# Patient Record
Sex: Female | Born: 1945 | Race: White | Hispanic: No | Smoking: Never smoker
Health system: Southern US, Community
[De-identification: ages and names within clinical notes are randomized; demographics above are authoritative.]

## PROBLEM LIST (undated history)

## (undated) DIAGNOSIS — E079 Disorder of thyroid, unspecified: Secondary | ICD-10-CM

## (undated) DIAGNOSIS — K219 Gastro-esophageal reflux disease without esophagitis: Secondary | ICD-10-CM

## (undated) DIAGNOSIS — E785 Hyperlipidemia, unspecified: Secondary | ICD-10-CM

## (undated) DIAGNOSIS — F32A Depression, unspecified: Secondary | ICD-10-CM

## (undated) DIAGNOSIS — I10 Essential (primary) hypertension: Secondary | ICD-10-CM

## (undated) DIAGNOSIS — F419 Anxiety disorder, unspecified: Secondary | ICD-10-CM

## (undated) DIAGNOSIS — E119 Type 2 diabetes mellitus without complications: Secondary | ICD-10-CM

## (undated) DIAGNOSIS — F329 Major depressive disorder, single episode, unspecified: Secondary | ICD-10-CM

## (undated) HISTORY — DX: Major depressive disorder, single episode, unspecified: F32.9

## (undated) HISTORY — DX: Disorder of thyroid, unspecified: E07.9

## (undated) HISTORY — DX: Type 2 diabetes mellitus without complications: E11.9

## (undated) HISTORY — PX: EYE SURGERY: SHX253

## (undated) HISTORY — PX: BREAST SURGERY: SHX581

## (undated) HISTORY — PX: ABDOMINAL HYSTERECTOMY: SHX81

## (undated) HISTORY — DX: Depression, unspecified: F32.A

## (undated) HISTORY — DX: Anxiety disorder, unspecified: F41.9

## (undated) HISTORY — PX: BREAST EXCISIONAL BIOPSY: SUR124

## (undated) HISTORY — PX: BREAST BIOPSY: SHX20

---

## 2012-06-14 ENCOUNTER — Emergency Department: Payer: Self-pay | Admitting: Internal Medicine

## 2012-06-14 LAB — URINALYSIS, COMPLETE
Bacteria: NONE SEEN
Ketone: NEGATIVE
Nitrite: NEGATIVE
Protein: NEGATIVE
RBC,UR: 1 /HPF (ref 0–5)
Specific Gravity: 1.009 (ref 1.003–1.030)
Squamous Epithelial: 1
WBC UR: 5 /HPF (ref 0–5)

## 2012-06-14 LAB — COMPREHENSIVE METABOLIC PANEL
Albumin: 3.2 g/dL — ABNORMAL LOW (ref 3.4–5.0)
BUN: 5 mg/dL — ABNORMAL LOW (ref 7–18)
Bilirubin,Total: 0.3 mg/dL (ref 0.2–1.0)
Calcium, Total: 8.6 mg/dL (ref 8.5–10.1)
Chloride: 108 mmol/L — ABNORMAL HIGH (ref 98–107)
Creatinine: 0.64 mg/dL (ref 0.60–1.30)
Glucose: 101 mg/dL — ABNORMAL HIGH (ref 65–99)
Potassium: 3.4 mmol/L — ABNORMAL LOW (ref 3.5–5.1)
SGOT(AST): 21 U/L (ref 15–37)
SGPT (ALT): 23 U/L (ref 12–78)
Sodium: 142 mmol/L (ref 136–145)
Total Protein: 6.3 g/dL — ABNORMAL LOW (ref 6.4–8.2)

## 2012-06-14 LAB — CBC
HCT: 30.2 % — ABNORMAL LOW (ref 35.0–47.0)
MCH: 28 pg (ref 26.0–34.0)
MCV: 83 fL (ref 80–100)
Platelet: 245 10*3/uL (ref 150–440)
RBC: 3.64 10*6/uL — ABNORMAL LOW (ref 3.80–5.20)

## 2012-12-20 ENCOUNTER — Inpatient Hospital Stay: Payer: Self-pay | Admitting: Internal Medicine

## 2012-12-20 LAB — CK TOTAL AND CKMB (NOT AT ARMC)
CK, Total: 156 U/L (ref 21–215)
CK-MB: 1.4 ng/mL (ref 0.5–3.6)

## 2012-12-20 LAB — COMPREHENSIVE METABOLIC PANEL
Albumin: 3.9 g/dL (ref 3.4–5.0)
Alkaline Phosphatase: 77 U/L (ref 50–136)
Anion Gap: 9 (ref 7–16)
BUN: 15 mg/dL (ref 7–18)
Bilirubin,Total: 0.4 mg/dL (ref 0.2–1.0)
Calcium, Total: 9.2 mg/dL (ref 8.5–10.1)
Co2: 23 mmol/L (ref 21–32)
Creatinine: 1.11 mg/dL (ref 0.60–1.30)
EGFR (Non-African Amer.): 51 — ABNORMAL LOW
Potassium: 3.7 mmol/L (ref 3.5–5.1)
SGPT (ALT): 26 U/L (ref 12–78)

## 2012-12-20 LAB — CBC WITH DIFFERENTIAL/PLATELET
Basophil #: 0.1 10*3/uL (ref 0.0–0.1)
Basophil %: 0.5 %
Eosinophil %: 1.4 %
HCT: 39.5 % (ref 35.0–47.0)
HGB: 13.1 g/dL (ref 12.0–16.0)
Lymphocyte #: 8.2 10*3/uL — ABNORMAL HIGH (ref 1.0–3.6)
Lymphocyte %: 47.1 %
MCH: 28.8 pg (ref 26.0–34.0)
MCHC: 33 g/dL (ref 32.0–36.0)
MCV: 87 fL (ref 80–100)
Platelet: 279 10*3/uL (ref 150–440)
WBC: 17.5 10*3/uL — ABNORMAL HIGH (ref 3.6–11.0)

## 2012-12-20 LAB — URINALYSIS, COMPLETE
Bacteria: NONE SEEN
Bilirubin,UR: NEGATIVE
Blood: NEGATIVE
Glucose,UR: NEGATIVE mg/dL (ref 0–75)
Ketone: NEGATIVE
Nitrite: NEGATIVE
Ph: 6 (ref 4.5–8.0)
Protein: NEGATIVE
RBC,UR: <1 /HPF (ref 0–5)
Specific Gravity: 1.015 (ref 1.003–1.030)

## 2012-12-21 LAB — CBC WITH DIFFERENTIAL/PLATELET
Basophil #: 0 10*3/uL (ref 0.0–0.1)
HCT: 39.4 % (ref 35.0–47.0)
MCH: 28.5 pg (ref 26.0–34.0)
MCHC: 32.8 g/dL (ref 32.0–36.0)
Monocyte #: 0.1 x10 3/mm — ABNORMAL LOW (ref 0.2–0.9)
Neutrophil #: 13.9 10*3/uL — ABNORMAL HIGH (ref 1.4–6.5)
RBC: 4.53 10*6/uL (ref 3.80–5.20)
RDW: 13.6 % (ref 11.5–14.5)

## 2012-12-22 LAB — WBC: WBC: 25.4 10*3/uL — ABNORMAL HIGH (ref 3.6–11.0)

## 2012-12-23 LAB — CBC WITH DIFFERENTIAL/PLATELET
Basophil #: 0.1 10*3/uL (ref 0.0–0.1)
Basophil %: 0.3 %
Eosinophil #: 0 10*3/uL (ref 0.0–0.7)
Eosinophil %: 0.1 %
HGB: 13.1 g/dL (ref 12.0–16.0)
Lymphocyte #: 2.6 10*3/uL (ref 1.0–3.6)
MCHC: 34.1 g/dL (ref 32.0–36.0)
Monocyte #: 0.6 x10 3/mm (ref 0.2–0.9)
Monocyte %: 2.5 %
Neutrophil #: 19.9 10*3/uL — ABNORMAL HIGH (ref 1.4–6.5)
RDW: 13.4 % (ref 11.5–14.5)
WBC: 23.2 10*3/uL — ABNORMAL HIGH (ref 3.6–11.0)

## 2012-12-23 LAB — CREATININE, SERUM: EGFR (Non-African Amer.): >60

## 2012-12-25 LAB — CULTURE, BLOOD (SINGLE)

## 2013-01-11 ENCOUNTER — Ambulatory Visit: Payer: Self-pay | Admitting: Internal Medicine

## 2013-01-11 LAB — BASIC METABOLIC PANEL
Anion Gap: 8 (ref 7–16)
BUN: 6 mg/dL — ABNORMAL LOW (ref 7–18)
Calcium, Total: 9 mg/dL (ref 8.5–10.1)
Co2: 25 mmol/L (ref 21–32)
Creatinine: 0.75 mg/dL (ref 0.60–1.30)
EGFR (African American): >60
Sodium: 139 mmol/L (ref 136–145)

## 2013-04-03 ENCOUNTER — Other Ambulatory Visit: Payer: Self-pay | Admitting: Physician Assistant

## 2013-04-03 LAB — COMPREHENSIVE METABOLIC PANEL
ALBUMIN: 4.1 g/dL (ref 3.4–5.0)
ALK PHOS: 78 U/L
ALT: 22 U/L (ref 12–78)
Anion Gap: 5 — ABNORMAL LOW (ref 7–16)
BUN: 7 mg/dL (ref 7–18)
Bilirubin,Total: 0.3 mg/dL (ref 0.2–1.0)
CHLORIDE: 105 mmol/L (ref 98–107)
CO2: 28 mmol/L (ref 21–32)
Calcium, Total: 9.2 mg/dL (ref 8.5–10.1)
Creatinine: 0.97 mg/dL (ref 0.60–1.30)
EGFR (Non-African Amer.): >60
Glucose: 115 mg/dL — ABNORMAL HIGH (ref 65–99)
Osmolality: 275 (ref 275–301)
Potassium: 4.3 mmol/L (ref 3.5–5.1)
SGOT(AST): 16 U/L (ref 15–37)
Sodium: 138 mmol/L (ref 136–145)
Total Protein: 7.1 g/dL (ref 6.4–8.2)

## 2013-04-03 LAB — CBC WITH DIFFERENTIAL/PLATELET
Basophil #: 0 10*3/uL (ref 0.0–0.1)
Basophil %: 0.2 %
Eosinophil #: 0.1 10*3/uL (ref 0.0–0.7)
Eosinophil %: 1.1 %
HCT: 40.8 % (ref 35.0–47.0)
HGB: 13.1 g/dL (ref 12.0–16.0)
Lymphocyte #: 3.8 10*3/uL — ABNORMAL HIGH (ref 1.0–3.6)
Lymphocyte %: 37.3 %
MCH: 28.3 pg (ref 26.0–34.0)
MCHC: 32 g/dL (ref 32.0–36.0)
MCV: 88 fL (ref 80–100)
MONO ABS: 0.5 x10 3/mm (ref 0.2–0.9)
MONOS PCT: 4.5 %
Neutrophil #: 5.9 10*3/uL (ref 1.4–6.5)
Neutrophil %: 56.9 %
PLATELETS: 248 10*3/uL (ref 150–440)
RBC: 4.62 10*6/uL (ref 3.80–5.20)
RDW: 13.6 % (ref 11.5–14.5)
WBC: 10.3 10*3/uL (ref 3.6–11.0)

## 2013-04-03 LAB — TSH: THYROID STIMULATING HORM: 1.13 u[IU]/mL

## 2013-07-17 ENCOUNTER — Other Ambulatory Visit: Payer: Self-pay | Admitting: Physician Assistant

## 2013-07-17 LAB — CBC WITH DIFFERENTIAL/PLATELET
Basophil #: 0.1 10*3/uL (ref 0.0–0.1)
Basophil %: 0.8 %
EOS PCT: 1.2 %
Eosinophil #: 0.1 10*3/uL (ref 0.0–0.7)
HCT: 39.7 % (ref 35.0–47.0)
HGB: 12.7 g/dL (ref 12.0–16.0)
Lymphocyte #: 3.9 10*3/uL — ABNORMAL HIGH (ref 1.0–3.6)
Lymphocyte %: 36.4 %
MCH: 29 pg (ref 26.0–34.0)
MCHC: 32.1 g/dL (ref 32.0–36.0)
MCV: 90 fL (ref 80–100)
MONOS PCT: 3.4 %
Monocyte #: 0.4 x10 3/mm (ref 0.2–0.9)
NEUTROS PCT: 58.2 %
Neutrophil #: 6.3 10*3/uL (ref 1.4–6.5)
Platelet: 252 10*3/uL (ref 150–440)
RBC: 4.4 10*6/uL (ref 3.80–5.20)
RDW: 14.3 % (ref 11.5–14.5)
WBC: 10.8 10*3/uL (ref 3.6–11.0)

## 2013-07-17 LAB — COMPREHENSIVE METABOLIC PANEL
ALBUMIN: 3.9 g/dL (ref 3.4–5.0)
Alkaline Phosphatase: 73 U/L
Anion Gap: 4 — ABNORMAL LOW (ref 7–16)
BUN: 7 mg/dL (ref 7–18)
Bilirubin,Total: 0.5 mg/dL (ref 0.2–1.0)
CREATININE: 0.91 mg/dL (ref 0.60–1.30)
Calcium, Total: 8.9 mg/dL (ref 8.5–10.1)
Chloride: 104 mmol/L (ref 98–107)
Co2: 27 mmol/L (ref 21–32)
EGFR (African American): >60
GLUCOSE: 144 mg/dL — AB (ref 65–99)
Osmolality: 271 (ref 275–301)
Potassium: 4.1 mmol/L (ref 3.5–5.1)
SGOT(AST): 28 U/L (ref 15–37)
SGPT (ALT): 30 U/L (ref 12–78)
SODIUM: 135 mmol/L — AB (ref 136–145)
TOTAL PROTEIN: 6.9 g/dL (ref 6.4–8.2)

## 2013-07-17 LAB — LIPID PANEL
CHOLESTEROL: 133 mg/dL (ref 0–200)
HDL: 36 mg/dL — AB (ref 40–60)
Ldl Cholesterol, Calc: 69 mg/dL (ref 0–100)
Triglycerides: 142 mg/dL (ref 0–200)
VLDL Cholesterol, Calc: 28 mg/dL (ref 5–40)

## 2013-07-17 LAB — TSH: Thyroid Stimulating Horm: 1.21 u[IU]/mL

## 2013-07-17 LAB — HEMOGLOBIN A1C: Hemoglobin A1C: 7.4 % — ABNORMAL HIGH (ref 4.2–6.3)

## 2013-08-08 ENCOUNTER — Ambulatory Visit: Payer: Self-pay | Admitting: Gastroenterology

## 2013-08-21 ENCOUNTER — Ambulatory Visit: Payer: Self-pay | Admitting: Physician Assistant

## 2013-08-27 ENCOUNTER — Ambulatory Visit: Payer: Self-pay | Admitting: Physician Assistant

## 2013-12-31 ENCOUNTER — Ambulatory Visit: Payer: Self-pay | Admitting: Physician Assistant

## 2013-12-31 LAB — T4, FREE: FREE THYROXINE: 0.9 ng/dL (ref 0.76–1.46)

## 2013-12-31 LAB — TSH: Thyroid Stimulating Horm: 2.05 u[IU]/mL

## 2014-03-01 DIAGNOSIS — G4733 Obstructive sleep apnea (adult) (pediatric): Secondary | ICD-10-CM | POA: Diagnosis not present

## 2014-03-05 DIAGNOSIS — G4733 Obstructive sleep apnea (adult) (pediatric): Secondary | ICD-10-CM | POA: Diagnosis not present

## 2014-03-28 DIAGNOSIS — E042 Nontoxic multinodular goiter: Secondary | ICD-10-CM | POA: Diagnosis not present

## 2014-03-28 DIAGNOSIS — G4700 Insomnia, unspecified: Secondary | ICD-10-CM | POA: Diagnosis not present

## 2014-03-28 DIAGNOSIS — E1165 Type 2 diabetes mellitus with hyperglycemia: Secondary | ICD-10-CM | POA: Diagnosis not present

## 2014-04-01 DIAGNOSIS — G4733 Obstructive sleep apnea (adult) (pediatric): Secondary | ICD-10-CM | POA: Diagnosis not present

## 2014-04-02 DIAGNOSIS — G4733 Obstructive sleep apnea (adult) (pediatric): Secondary | ICD-10-CM | POA: Diagnosis not present

## 2014-04-17 ENCOUNTER — Ambulatory Visit: Payer: Self-pay | Admitting: Physician Assistant

## 2014-04-17 DIAGNOSIS — E039 Hypothyroidism, unspecified: Secondary | ICD-10-CM | POA: Diagnosis not present

## 2014-04-17 DIAGNOSIS — E049 Nontoxic goiter, unspecified: Secondary | ICD-10-CM | POA: Diagnosis not present

## 2014-04-17 DIAGNOSIS — G4733 Obstructive sleep apnea (adult) (pediatric): Secondary | ICD-10-CM | POA: Diagnosis not present

## 2014-04-30 DIAGNOSIS — G4733 Obstructive sleep apnea (adult) (pediatric): Secondary | ICD-10-CM | POA: Diagnosis not present

## 2014-05-26 DIAGNOSIS — J209 Acute bronchitis, unspecified: Secondary | ICD-10-CM | POA: Diagnosis not present

## 2014-05-26 DIAGNOSIS — R0602 Shortness of breath: Secondary | ICD-10-CM | POA: Diagnosis not present

## 2014-05-26 DIAGNOSIS — J4520 Mild intermittent asthma, uncomplicated: Secondary | ICD-10-CM | POA: Diagnosis not present

## 2014-05-30 DIAGNOSIS — G4700 Insomnia, unspecified: Secondary | ICD-10-CM | POA: Diagnosis not present

## 2014-05-30 DIAGNOSIS — E042 Nontoxic multinodular goiter: Secondary | ICD-10-CM | POA: Diagnosis not present

## 2014-05-30 DIAGNOSIS — E1165 Type 2 diabetes mellitus with hyperglycemia: Secondary | ICD-10-CM | POA: Diagnosis not present

## 2014-05-30 DIAGNOSIS — I10 Essential (primary) hypertension: Secondary | ICD-10-CM | POA: Diagnosis not present

## 2014-05-30 NOTE — H&P (Signed)
PATIENT NAME:  [name], ANGELO MR#:  [id number] DATE OF BIRTH:  Nov 19, 1945  DATE OF ADMISSION:  12/20/2012  ADMITTING PHYSICIAN: Gladstone Lighter, M.D.   PRIMARY CARE PHYSICIAN: Nonlocal in [facility].   CHIEF COMPLAINT: Cough and breathing difficulty.   HISTORY OF PRESENT ILLNESS: Ms. Sonier is a 69 year old Caucasian female with past medical history significant for hypertension, depression and anxiety, hyperlipidemia, insomnia and history of mold exposure while living in [place] for two years developing hypersensitivity pneumonitis, and reactive airway disease at the time. Recently moved with her daughter here in [place] for the last six months.  Since moving here she has not had any respiratory issues. Her symptoms this time started with cold-like symptoms with congestion, chills, low-grade fevers for the past four days; however, her breathing got worse yesterday and she was in visible respiratory distress when seen by daughter this morning and EMS was called and she was brought to the hospital. The patient's sats here are at 90% on room air and even with minimal exertion her sats were dropping into 80s. She has significant wheezing on exam and is being admitted for possible bronchitis and reactive airway disease at this time.   PAST MEDICAL HISTORY: 1. Hypertension.  2. Depression and anxiety.  3. Insomnia. 4. Chronic right knee pain.  5. Gastroesophageal reflux disease.    PAST SURGICAL HISTORY: Hysterectomy.   ALLERGIES: No known drug allergies.   CURRENT HOME MEDICATIONS:  1. Acetaminophen/hydrocodone 325 mg/10 mg 1 tablet p.o. b.i.d. p.r.n.  2. Aspirin 81 mg p.o. daily.  3. Benzonatate 100 mg capsules q.8 hours p.r.n. for cough.  4. Celexa 40 mg p.o. daily.  5. Cyclobenzaprine 10 mg p.o. b.i.d. p.r.n.  6. Diltiazem  240 mg p.o. daily.  7. Diphenhydramine 25 mg 2 tablets at bedtime.  8. Lisinopril 20 mg p.o. daily.  9. Lorazepam 1 mg p.o. 3 times a day as  needed for anxiety.  10. Protonix 40 mg p.o. b.i.d.  11. Promethazine 25 mg p.o. b.i.d. p.r.n. for nausea.  12. Simvastatin 40 mg p.o. at bedtime.  13. Sucralfate 1 gram 4 times a day for GERD. 14. Zolpidem 10 mg p.o.  at bedtime for sleep.   SOCIAL HISTORY: Currently living at home with her daughter and grandkids. No history of any smoking, smoke exposure  other alcohol use. Stable at baseline and walks independently.   FAMILY HISTORY: History of coronary artery disease, cancer and diabetes runs in the family.    REVIEW OF SYSTEMS:  CONSTITUTIONAL: No fever, positive for fatigue and weakness.  EYES: Uses reading glasses. No blurred vision, double vision, inflammation or glaucoma. No cataracts.  ENT: No tinnitus, ear pain, hearing loss, epistaxis or discharge. RESPIRATORY: Positive for cough, wheeze. No hemoptysis or chronic obstructive pulmonary disease.  CARDIOVASCULAR: No chest pain, orthopnea, edema, arrhythmia, palpitations or syncope.  GASTROINTESTINAL: No nausea, vomiting, diarrhea, abdominal pain, hematemesis or melena.  GENITOURINARY: No dysuria, hematuria, renal calculus, frequency or incontinence.  ENDOCRINE: No polyuria, nocturia, thyroid problems, heat or cold intolerance.  HEMATOLOGY: No anemia, easy bruising or bleeding.  SKIN: No acne, rash or lesions.  MUSCULOSKELETAL: Positive for right knee pain and chronic lower extremity pain. No arthritis or gout.  NEUROLOGIC: No numbness, weakness, CVA, TIA or seizures.  PSYCHOLOGICAL: No anxiety, insomnia, depression.   PHYSICAL EXAMINATION: VITAL SIGNS: Temperature 97.9,  degrees Fahrenheit, pulse 134, respirations 18, blood pressure 140/90, pulse ox  90% on room air and 85% percent on minimal exertion  on room air.  GENERAL: Well-built, well-nourished female sitting in bed in mild respiratory distress secondary to wheeze. HEENT: Normocephalic, atraumatic. Pupils equal, round, reacting to light. Anicteric sclerae. Extraocular  movements intact. Oropharynx clear without erythema, mass or exudates.  NECK: Supple. No thyromegaly, JVD or carotid bruits. No lymphadenopathy.  LUNGS: She is moving air bilaterally, has coarse inspiratory sounds and mild expiratory wheezes diffusely. No crackles. No rales heard, minimal use of accessory muscles on exertion.  CARDIOVASCULAR: S1, S2 regular rate and rhythm. No murmurs, rubs or gallops.  ABDOMEN: Soft, nontender, nondistended. No hepatosplenomegaly. Normal bowel sounds.  EXTREMITIES: No pedal edema. No clubbing or cyanosis, 2+ dorsalis pedis pulses palpable bilaterally.  SKIN: No acne, rash or lesions.  LYMPHATICS: No cervical or inguinal lymphadenopathy.  NEUROLOGIC: Cranial nerves II through XII remain intact. Motor strength 5/5 all 4 extremities. Sensation is intact and deep tendon reflexes are 2+ symmetric bilaterally.  PSYCHOLOGICAL: The patient is awake, alert, oriented x 3.   LAB DATA: WBC 17.3, hemoglobin 13.1, hematocrit 39.2, platelet count 279.   Sodium 37, potassium 3.7, chloride 105, bicarbonate 23. BUN 50, creatinine  1.1, glucose 138,  calcium of 9.2.   ALT 26. AST 25 , total bili 0.4 and albumin of 3.9. Troponin less than 0.02. CK 156, CPK-MB 1.4.   Chest x-ray showing hiatal hernia, clear lung fields. There was no acute cardiopulmonary disease.   EKG showing sinus tachycardia, heart rate of 132, no acute ST-T wave abnormalities.   ASSESSMENT AND PLAN: This is a 69 year old female with history of hypertension, depression and anxiety, insomnia and history of hypersensitivity pneumonitis secondary to mold exposure 2 years ago. Relocated here recently, comes secondary to  acute respiratory distress and hypoxia.  1. Acute respiratory distress with hypoxia on minimal exertion, likely secondary to acute bronchitis with reactive airway disease. Chest x-ray appears clear. CT chest is pending. The patient does not have any history of smoking. We will admit. Start IV  steroids,  DuoNebs and also Levaquin.  Follow  blood cultures. Use oxygen as needed.  2. Hypertension. Continue home medications.  3. Depression and anxiety and insomnia. Continue her home medications.  4. Hiatal hernia and gastroesophageal reflux disease. The patient on sucralfate and Protonix b.i.d.   5. Deep vein thrombosis prophylaxis with Lovenox.  6. CODE STATUS: FULL CODE.   TIME SPENT ON ADMISSION: 50 minutes.    ____________________________ Gladstone Lighter, MD rk:sg D: 12/20/2012 15:45:32 ET T: 12/20/2012 16:42:11 ET JOB#: [id number]  cc: Gladstone Lighter, MD, <Dictator> Gladstone Lighter MD ELECTRONICALLY SIGNED 12/21/2012 17:07

## 2014-05-30 NOTE — Discharge Summary (Signed)
PATIENT NAME:  [name], [name] MR#:  [id number] DATE OF BIRTH:  12-08-1945  DATE OF ADMISSION:  12/20/2012 DATE OF DISCHARGE:  12/23/2012  DISCHARGE DIAGNOSES: 1.  Acute respiratory failure.  2.  Chronic obstructive pulmonary disease exacerbation.  3.  Hypertension.  4.  Depression.  5.  Pneumonia.   CONDITION ON DISCHARGE: Stable.   CODE STATUS: FULL CODE.   DISCHARGE MEDICATIONS: 1.  Simvastatin 40 mg oral tablet once a day.  2.  Zolpidem 10 mg oral tablet once a day.  3.  Citalopram 40 mg once a day.  4.  Benzonatate 100 mg every 8 hours as needed for cough.  5.  Lorazepam 1 mg 3 times a day as needed for anxiety.  6.  Aspirin 81 mg once a day.  7.  Pantoprazole 40 mg delayed-release 2 times a day.  8.  Sucralfate 1 gram oral tablet 4 times a day.  9.  Cyclobenzaprine 10 mg oral tablet 2 times a day as needed for leg pain.  10.  Acetaminophen and hydrocodone 10 mg 2 times a day.  11.  Diltiazem 240 mg 24-hour extended release once a day.  12.  Prednisone 10 mg capsule, start at 60 mg and taper by 10 mg daily until complete.  13.  Lisinopril 20 mg once a day.  14.  Metoprolol 25 mg 2 times a day.  15.  Augmentin 875 plus 125 mg every 12 hours for 5 days.  16.  Spiriva 18 mcg inhalation once a day.  17.  Advair 1 puff 2 times a day.  18.  DuoNeb inhalation 3 mL 4 times a day as needed.  DISCHARGE INSTRUCTIONS: Advised to follow in pulmonary clinic in 2 weeks to schedule bronchoscopy with Dr. Humphrey Rolls.  HISTORY OF PRESENT ILLNESS: The patient is a 69 year old Caucasian female with past medical history significant for hypertension, depression, anxiety, hyperlipidemia, insomnia, and mold exposure while living in [place] for 2 years. Had hypersensitivity pneumonitis and reactive airway disease. Came to hospital because she had repeatedly respiratory issues. Started with cold like symptoms and then congestion. Was in respiratory distress in the Emergency Room. On room air she was  saturating 90%, but with minimal exertion she was dropping in 80s and significant wheezing on exam so admitted for possible bronchitis with reactive airway disease.   HOSPITAL COURSE AND STAY: Acute respiratory failure as this was present on the admission evident by dropping oxygen saturation and needing supplemental oxygen to maintain it with minimal exertion and having use of accessory muscles of respiration with excessive wheezing. She was started on IV steroids and antibiotics and nebulizer treatment. Pulmonary consult was called in with Dr. Devona Konig and he suggested to follow in office after 2 weeks for possible bronchoscopy. There were also some findings questionable for left upper lobe pneumonia and she was continued on antibiotic, felt significantly better after 3 to 4 days of treatment, and we discharged home with Augmentin orally.   OTHER MEDICAL ISSUES: 1.  Hypertension. We continued home medication and added metoprolol for better control.  2.  Depression and anxiety. We continued home medication, and the patient remained stable.   CONSULTATIONS IN THE HOSPITAL: Dr. Devona Konig for pulmonology.   IMPORTANT LABORATORY AND DIAGNOSTICS: WBC 17.5 on admission AND hemoglobin 13.1. Creatinine 1.11, chloride 105, potassium 3.7. Troponin less than 0.02.   Chest x-ray, PA and lateral, on admission showed stable Large hiatal hernia. No acute cardiopulmonary disease. CT chest with contrast showed linear calcific lesions  in left main stem bronchus extending into the left upper lobe bronchus most compatible with broncholith or less likely calcified lymph node into bronchus, resulted in secondary collapse of anterior left upper lobe.   White cell count was 16,000 on admission, which went up to 23,000 on further follow-up, possibly due to steroids.   TOTAL TIME SPENT ON THIS DISCHARGE: 40 minutes. ____________________________ Ceasar Lund Anselm Jungling, MD vgv:sb D: 12/24/2012 16:23:22  ET T: 12/24/2012 17:23:21 ET JOB#: [id number]  cc: Ceasar Lund. Anselm Jungling, MD, <Dictator> Allyne Gee, MD Vaughan Basta MD ELECTRONICALLY SIGNED 01/07/2013 10:10

## 2014-05-31 DIAGNOSIS — G4733 Obstructive sleep apnea (adult) (pediatric): Secondary | ICD-10-CM | POA: Diagnosis not present

## 2014-06-01 DIAGNOSIS — Z8639 Personal history of other endocrine, nutritional and metabolic disease: Secondary | ICD-10-CM | POA: Insufficient documentation

## 2014-06-01 DIAGNOSIS — Z8709 Personal history of other diseases of the respiratory system: Secondary | ICD-10-CM | POA: Insufficient documentation

## 2014-06-01 DIAGNOSIS — Z8719 Personal history of other diseases of the digestive system: Secondary | ICD-10-CM | POA: Insufficient documentation

## 2014-06-01 DIAGNOSIS — Z8679 Personal history of other diseases of the circulatory system: Secondary | ICD-10-CM | POA: Insufficient documentation

## 2014-06-09 DIAGNOSIS — H2513 Age-related nuclear cataract, bilateral: Secondary | ICD-10-CM | POA: Diagnosis not present

## 2014-07-17 ENCOUNTER — Ambulatory Visit: Payer: Self-pay | Admitting: Psychiatry

## 2014-07-21 ENCOUNTER — Telehealth: Payer: Self-pay | Admitting: Psychiatry

## 2014-07-22 ENCOUNTER — Encounter: Payer: Self-pay | Admitting: Psychiatry

## 2014-07-22 ENCOUNTER — Ambulatory Visit (INDEPENDENT_AMBULATORY_CARE_PROVIDER_SITE_OTHER): Payer: 59 | Admitting: Psychiatry

## 2014-07-22 VITALS — BP 126/78 | HR 68 | Temp 97.3°F | Ht 60.0 in

## 2014-07-22 DIAGNOSIS — F329 Major depressive disorder, single episode, unspecified: Secondary | ICD-10-CM

## 2014-07-22 DIAGNOSIS — F32A Depression, unspecified: Secondary | ICD-10-CM

## 2014-07-22 DIAGNOSIS — F410 Panic disorder [episodic paroxysmal anxiety] without agoraphobia: Secondary | ICD-10-CM | POA: Diagnosis not present

## 2014-07-22 DIAGNOSIS — F411 Generalized anxiety disorder: Secondary | ICD-10-CM | POA: Insufficient documentation

## 2014-07-22 DIAGNOSIS — F331 Major depressive disorder, recurrent, moderate: Secondary | ICD-10-CM | POA: Insufficient documentation

## 2014-07-22 DIAGNOSIS — G4700 Insomnia, unspecified: Secondary | ICD-10-CM | POA: Diagnosis not present

## 2014-07-22 DIAGNOSIS — G4709 Other insomnia: Secondary | ICD-10-CM | POA: Insufficient documentation

## 2014-07-22 MED ORDER — CITALOPRAM HYDROBROMIDE 40 MG PO TABS: 40.0000 mg | ORAL_TABLET | Freq: Every day | ORAL | Status: DC

## 2014-07-22 MED ORDER — LORAZEPAM 1 MG PO TABS: 1.0000 mg | ORAL_TABLET | Freq: Three times a day (TID) | ORAL | Status: DC | PRN

## 2014-07-22 MED ORDER — QUETIAPINE FUMARATE 100 MG PO TABS: 100.0000 mg | ORAL_TABLET | Freq: Every day | ORAL | Status: DC

## 2014-07-22 NOTE — Progress Notes (Signed)
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 She relates that her primary care physician continues to prescribe her Jerrye Noble for insomnia. She is no longer taking the Ambien that was previously prescribed from this office. I did bring in the patient's daughter to clarify the patient's medications. The patient continues to take Celexa, lorazepam and Seroquel.  Patient indicates her mood is been good. She states she has her grandchildren full-time for the summer. She states that their grandchildren are ages 58 and 60. She states that her appetite has been good however she states she is trying diet plans. She states most recent diet plan she did not like because the food did not taste very good and then she would end up eating other things. She states she is sleeping well. She denies any side effects from her medications.  Chief Complaint:  Visit Diagnosis:  No diagnosis found.  Past Medical History:  Past Medical History  Diagnosis Date  . Anxiety   . Depression   . Thyroid disease   . Diabetes mellitus, type II     Past Surgical History  Procedure Laterality Date  . Breast biopsy    . Abdominal hysterectomy     Family History:  Family History  Problem Relation Age of Onset  . Anxiety disorder Mother   . Colon cancer Mother   . Heart disease Father   . Obesity Sister   . Anxiety disorder Sister   . Depression Sister   . Osteoporosis Sister    Social History:  History   Social History  . Marital Status: Widowed    Spouse Name: N/A  . Number of Children: N/A  . Years of Education: N/A   Social History Main Topics  . Smoking status: Never Smoker   . Smokeless tobacco: Never Used  . Alcohol Use: No  . Drug Use: No  . Sexual Activity: No   Other Topics Concern  . None   Social History Narrative   Additional History:    Assessment:   Musculoskeletal: Strength & Muscle Tone: within normal limits Gait & Station: normal Patient leans: N/A  Psychiatric Specialty Exam: HPI  Review of Systems  Psychiatric/Behavioral: Negative for depression, suicidal ideas, hallucinations, memory loss and substance abuse. The patient has insomnia (Stable with treatment with Belsomra). The patient is not nervous/anxious.     Blood pressure 126/78, pulse 68, temperature 97.3 F (36.3 C), temperature source Tympanic, height 5' (1.524 m), SpO2 91 %.There is no weight on file to calculate BMI.  General Appearance: Neat and Well Groomed  Eye Contact:  Good  Speech:  Clear and Coherent and Normal Rate  Volume:  Normal  Mood:  Good  Affect:  Congruent  Thought Process:  Linear and Logical  Orientation:  Full (Time, Place, and Person)  Thought Content:  Negative  Suicidal Thoughts:  No  Homicidal Thoughts:  No  Memory:  Immediate;   Good Recent;   Good Remote;   Good  Judgement:  Good  Insight:  Good  Psychomotor Activity:  Negative  Concentration:  Good  Recall:  Good  Fund of Knowledge: Good  Language: Good  Akathisia:  Negative  Handed:  Right unknown  AIMS (if indicated):  Not done  Assets:  Communication Skills Desire for Improvement Social Support  ADL's:  Intact  Cognition: WNL  Sleep:  Good  Is the patient at risk to self?  No. Has the patient been a risk to self in the past 6 months?  No. Has the patient been a risk to self within the distant past?  No. Is the patient a risk to others?  No. Has the patient been a risk to others in the past 6 months?  No. Has the patient been a risk to others within the distant past?  No.  Current Medications: Current Outpatient Prescriptions  Medication Sig Dispense Refill  . aspirin 81 MG tablet Take 1 tablet by mouth daily.    . BELSOMRA 20 MG TABS TAKE 1 TABLET BY MOUTH EVERY NIGHT AT BEDTIME AS NEEDED FOR INSOMNIA.  3  . citalopram (CELEXA) 40 MG tablet  Take 1 tablet (40 mg total) by mouth daily. 30 tablet 4  . diltiazem (CARDIZEM CD) 240 MG 24 hr capsule Take 240 mg by mouth daily.  3  . levothyroxine (SYNTHROID, LEVOTHROID) 50 MCG tablet Take 50 mcg by mouth every morning.  5  . lisinopril (PRINIVIL,ZESTRIL) 20 MG tablet Take 20 mg by mouth daily.  5  . LORazepam (ATIVAN) 1 MG tablet Take 1 tablet (1 mg total) by mouth 3 (three) times daily as needed. 90 tablet 4  . meloxicam (MOBIC) 7.5 MG tablet Take by mouth.    . metFORMIN (GLUCOPHAGE) 500 MG tablet Take 500 mg by mouth daily.  3  . metoprolol succinate (TOPROL-XL) 25 MG 24 hr tablet Take by mouth.    . metoprolol tartrate (LOPRESSOR) 25 MG tablet TAKE 1 TWICE DAILY  5  . pantoprazole (PROTONIX) 40 MG tablet Take by mouth.    . QUEtiapine (SEROQUEL) 100 MG tablet Take 1 tablet (100 mg total) by mouth at bedtime. 30 tablet 4  . simvastatin (ZOCOR) 40 MG tablet Take 40 mg by mouth at bedtime.  4  . sucralfate (CARAFATE) 1 G tablet Take by mouth.    . zolpidem (AMBIEN) 10 MG tablet Take 1 tablet by mouth at bedtime.     No current facility-administered medications for this visit.    Medical Decision Making:  Established Problem, Stable/Improving (1)  Treatment Plan Summary:Medication management will continue the patient's medications without any changes. We'll continue Celexa 40 g a day, lorazepam 1 mg 3 times a day as needed and Seroquel 100 mg at bedtime. Patient is no longer using mail-order pharmacy but her medication [facility] to [facility] on [address] in [place].   Faith Rogue 07/22/2014, 10:56 AM

## 2014-10-08 ENCOUNTER — Encounter: Payer: Self-pay | Admitting: Psychiatry

## 2014-10-08 ENCOUNTER — Ambulatory Visit (INDEPENDENT_AMBULATORY_CARE_PROVIDER_SITE_OTHER): Payer: 59 | Admitting: Psychiatry

## 2014-10-08 VITALS — BP 118/82 | HR 71 | Temp 98.2°F | Ht 59.5 in | Wt 188.0 lb

## 2014-10-08 DIAGNOSIS — F410 Panic disorder [episodic paroxysmal anxiety] without agoraphobia: Secondary | ICD-10-CM

## 2014-10-08 DIAGNOSIS — G4700 Insomnia, unspecified: Secondary | ICD-10-CM | POA: Diagnosis not present

## 2014-10-08 NOTE — Progress Notes (Signed)
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 She indicated that overall things are going pretty well for her. She states she is glad that school has started because she was babysitting for her grandchildren over the summer. She states that during the school year they only come over for a few hours after school.  She states she continues to take her medications without any issues. She did discuss today that she's been having episodes in the morning where she feels like a tightness in her upper body that last for 10-15 seconds. She states it happens every morning. I asked how long this is been going on and she said for years. She initially stated she thought it was a panic attack however I asked her whether she had feelings of being nervous anxious or scared. She stated this was not the case but rather she was just frustrated about the sensation because it was disturbing her sleep. I encouraged her to discuss this with her primary care physician and she states she leaves she will see her primary care physician in December.  Chief Complaint:  panic attacks Chief Complaint    Follow-up; Medication Refill; Anxiety; Panic Attack     Visit Diagnosis:  No diagnosis found.  Past Medical History:  Past Medical History  Diagnosis Date  . Anxiety   . Depression   . Thyroid disease   . Diabetes mellitus, type II     Past Surgical History  Procedure Laterality Date  . Breast biopsy    . Abdominal hysterectomy     Family History:  Family History  Problem Relation Age of Onset  . Anxiety disorder Mother   . Colon cancer Mother   . Heart disease Father   . Anxiety disorder Father   . Obesity Sister   . Anxiety disorder Sister   . Depression Sister   . Osteoporosis Sister    Social History:  Social History   Social History  . Marital Status: Widowed   Spouse Name: N/A  . Number of Children: N/A  . Years of Education: N/A   Social History Main Topics  . Smoking status: Never Smoker   . Smokeless tobacco: Never Used  . Alcohol Use: No  . Drug Use: No  . Sexual Activity: No   Other Topics Concern  . None   Social History Narrative   Additional History:   Assessment:   Musculoskeletal: Strength & Muscle Tone: within normal limits Gait & Station: normal Patient leans: N/A  Psychiatric Specialty Exam: Anxiety Symptoms include insomnia (Stable with treatment with Belsomra). Patient reports no nervous/anxious behavior or suicidal ideas.      Review of Systems  Psychiatric/Behavioral: Negative for depression, suicidal ideas, hallucinations, memory loss and substance abuse. The patient has insomnia (Stable with treatment with Belsomra). The patient is not nervous/anxious.     Blood pressure 118/82, pulse 71, temperature 98.2 F (36.8 C), temperature source Tympanic, height 4' 11.5" (1.511 m), weight 188 lb (85.276 kg), SpO2 91 %.Body mass index is 37.35 kg/(m^2).  General Appearance: Neat and Well Groomed  Eye Contact:  Good  Speech:  Clear and Coherent and Normal Rate  Volume:  Normal  Mood:  Good  Affect:  Congruent, able to joke, smile when discussing her primary care physician joke with her about pain with a tetanus shot /flu shot  Thought Process:  Linear  and Logical  Orientation:  Full (Time, Place, and Person)  Thought Content:  Negative  Suicidal Thoughts:  No  Homicidal Thoughts:  No  Memory:  Immediate;   Good Recent;   Good Remote;   Good  Judgement:  Good  Insight:  Good  Psychomotor Activity:  Negative  Concentration:  Good  Recall:  Good  Fund of Knowledge: Good  Language: Good  Akathisia:  Negative  Handed:  Right unknown  AIMS (if indicated):  Not done  Assets:  Communication Skills Desire for Improvement Social Support  ADL's:  Intact  Cognition: WNL  Sleep:  Good    Is the patient at risk  to self?  No. Has the patient been a risk to self in the past 6 months?  No. Has the patient been a risk to self within the distant past?  No. Is the patient a risk to others?  No. Has the patient been a risk to others in the past 6 months?  No. Has the patient been a risk to others within the distant past?  No.  Current Medications: Current Outpatient Prescriptions  Medication Sig Dispense Refill  . aspirin 81 MG tablet Take 1 tablet by mouth daily.    . BELSOMRA 20 MG TABS TAKE 1 TABLET BY MOUTH EVERY NIGHT AT BEDTIME AS NEEDED FOR INSOMNIA.  3  . citalopram (CELEXA) 40 MG tablet Take 1 tablet (40 mg total) by mouth daily. 30 tablet 4  . diltiazem (CARDIZEM CD) 240 MG 24 hr capsule Take 240 mg by mouth daily.  3  . levothyroxine (SYNTHROID, LEVOTHROID) 50 MCG tablet Take 50 mcg by mouth every morning.  5  . lisinopril (PRINIVIL,ZESTRIL) 20 MG tablet Take 20 mg by mouth daily.  5  . LORazepam (ATIVAN) 1 MG tablet Take 1 tablet (1 mg total) by mouth 3 (three) times daily as needed. 90 tablet 4  . meloxicam (MOBIC) 7.5 MG tablet Take by mouth.    . metFORMIN (GLUCOPHAGE) 500 MG tablet Take 500 mg by mouth daily.  3  . metoprolol succinate (TOPROL-XL) 25 MG 24 hr tablet Take by mouth.    . metoprolol tartrate (LOPRESSOR) 25 MG tablet TAKE 1 TWICE DAILY  5  . pantoprazole (PROTONIX) 40 MG tablet Take by mouth.    . QUEtiapine (SEROQUEL) 100 MG tablet Take 1 tablet (100 mg total) by mouth at bedtime. 30 tablet 4  . simvastatin (ZOCOR) 40 MG tablet Take 40 mg by mouth at bedtime.  4  . sucralfate (CARAFATE) 1 G tablet Take by mouth.    . zolpidem (AMBIEN) 10 MG tablet Take 1 tablet by mouth at bedtime.    Marland Kitchen BOOSTRIX 5-2.5-18.5 injection inject 0.5 milliliter intramuscularly  0  . FLUZONE HIGH-DOSE 0.5 ML SUSY inject 0.5 milliliter intramuscularly  0  . nitrofurantoin, macrocrystal-monohydrate, (MACROBID) 100 MG capsule Take 100 mg by mouth 2 (two) times daily.  0  . promethazine (PHENERGAN)  25 MG tablet TAKE 1 TABLET BY MOUTH TWICE DAILY AS NEEDED FOR NAUSEA  1   No current facility-administered medications for this visit.    Medical Decision Making:  Established Problem, Stable/Improving (1)  Treatment Plan Summary:Medication management patient has been stable on this regimen. Will continue the patient's medications without any changes. We'll continue Celexa 40 g a day, lorazepam 1 mg 3 times a day as needed and Seroquel 100 mg at bedtime.  He gets Belsomra  from her primary care physician. Patient was written for 5 months of  medication back in June and that she should have enough to get her through the middle of November. She will call any questions or refill needs prior to her next appointment. She will follow up in 3 months.    Faith Rogue 10/08/2014, 11:04 AM

## 2014-12-03 ENCOUNTER — Other Ambulatory Visit
Admission: RE | Admit: 2014-12-03 | Discharge: 2014-12-03 | Disposition: A | Discharge status: Home / Self Care | Payer: Medicare Other | Source: Ambulatory Visit | Attending: Physician Assistant | Admitting: Physician Assistant

## 2014-12-03 DIAGNOSIS — E119 Type 2 diabetes mellitus without complications: Secondary | ICD-10-CM | POA: Diagnosis present

## 2014-12-03 DIAGNOSIS — Z0000 Encounter for general adult medical examination without abnormal findings: Secondary | ICD-10-CM | POA: Diagnosis present

## 2014-12-03 DIAGNOSIS — E785 Hyperlipidemia, unspecified: Secondary | ICD-10-CM | POA: Insufficient documentation

## 2014-12-03 DIAGNOSIS — I10 Essential (primary) hypertension: Secondary | ICD-10-CM | POA: Insufficient documentation

## 2014-12-03 LAB — CBC WITH DIFFERENTIAL/PLATELET
Basophils Absolute: 0.1 10*3/uL (ref 0–0.1)
Basophils Relative: 1 %
EOS PCT: 2 %
Eosinophils Absolute: 0.2 10*3/uL (ref 0–0.7)
HCT: 39.5 % (ref 35.0–47.0)
Hemoglobin: 12.9 g/dL (ref 12.0–16.0)
LYMPHS ABS: 3.9 10*3/uL — AB (ref 1.0–3.6)
LYMPHS PCT: 33 %
MCH: 28.8 pg (ref 26.0–34.0)
MCHC: 32.6 g/dL (ref 32.0–36.0)
MCV: 88.5 fL (ref 80.0–100.0)
MONOS PCT: 6 %
Monocytes Absolute: 0.7 10*3/uL (ref 0.2–0.9)
Neutro Abs: 6.9 10*3/uL — ABNORMAL HIGH (ref 1.4–6.5)
Neutrophils Relative %: 58 %
Platelets: 253 10*3/uL (ref 150–440)
RBC: 4.46 MIL/uL (ref 3.80–5.20)
RDW: 14 % (ref 11.5–14.5)
WBC: 11.8 10*3/uL — ABNORMAL HIGH (ref 3.6–11.0)

## 2014-12-03 LAB — LIPID PANEL
CHOL/HDL RATIO: 3.8 ratio
CHOLESTEROL: 145 mg/dL (ref 0–200)
HDL: 38 mg/dL — ABNORMAL LOW (ref >40)
LDL Cholesterol: 63 mg/dL (ref 0–99)
Triglycerides: 218 mg/dL — ABNORMAL HIGH (ref <150)
VLDL: 44 mg/dL — AB (ref 0–40)

## 2014-12-03 LAB — COMPREHENSIVE METABOLIC PANEL
ALBUMIN: 4.1 g/dL (ref 3.5–5.0)
ALT: 22 U/L (ref 14–54)
AST: 24 U/L (ref 15–41)
Alkaline Phosphatase: 54 U/L (ref 38–126)
Anion gap: 8 (ref 5–15)
BUN: 10 mg/dL (ref 6–20)
CO2: 30 mmol/L (ref 22–32)
Calcium: 9.8 mg/dL (ref 8.9–10.3)
Chloride: 104 mmol/L (ref 101–111)
Creatinine, Ser: 0.86 mg/dL (ref 0.44–1.00)
GFR calc Af Amer: >60 mL/min (ref >60)
GFR calc non Af Amer: >60 mL/min (ref >60)
GLUCOSE: 159 mg/dL — AB (ref 65–99)
POTASSIUM: 4.6 mmol/L (ref 3.5–5.1)
Sodium: 142 mmol/L (ref 135–145)
Total Bilirubin: 0.6 mg/dL (ref 0.3–1.2)
Total Protein: 6.7 g/dL (ref 6.5–8.1)

## 2014-12-03 LAB — TSH: TSH: 1.021 u[IU]/mL (ref 0.350–4.500)

## 2014-12-04 LAB — T4: T4 TOTAL: 6.5 ug/dL (ref 4.5–12.0)

## 2014-12-05 ENCOUNTER — Other Ambulatory Visit: Payer: Self-pay

## 2014-12-05 NOTE — Telephone Encounter (Signed)
pt daughter called states that he mom needs a refill on her lorazepam. pt states that her mom was in [place] on a trip and she had to use her refill to be transffered to [place] so now she needs a new rx.  pt daugher was told that the doctor was already left for day and would not be back into the office until Tuesday.  Pt daughter got upset with me because she claims that she came up here on Wednesday at 10:00 and spoke with someone and told them that he mom pharmacy had to be [facility] [address] [facility]s [facility].  Pt daughter also states that "the doctor will be mad because last time she went without her medication her mom when threw bad withdrawal and the dr did not know that she was out of her medications."  She was told again that the doctor would not be back until Tuesday and that if she needed to go to er or walk in clinic.

## 2014-12-06 MED ORDER — LORAZEPAM 1 MG PO TABS: 1.0000 mg | ORAL_TABLET | Freq: Three times a day (TID) | ORAL | Status: DC | PRN

## 2014-12-08 NOTE — Telephone Encounter (Signed)
rx faxed and confirmed -lorazepam 1 mg id # [id number] order ID# [id number]

## 2014-12-12 ENCOUNTER — Other Ambulatory Visit: Payer: Self-pay

## 2014-12-12 MED ORDER — QUETIAPINE FUMARATE 100 MG PO TABS: 100.0000 mg | ORAL_TABLET | Freq: Every day | ORAL | Status: DC

## 2014-12-12 NOTE — Telephone Encounter (Signed)
spoke with patient let her know that rx was sent to [facility]

## 2014-12-12 NOTE — Telephone Encounter (Signed)
had to call in rx because the e-prescriped failed. fx was called in .

## 2014-12-12 NOTE — Telephone Encounter (Signed)
daughter called states that mom needs a new rx sent to [facility] on [address] because [facility] used [facility] because of [facility].  Pt has appt on  12-24-14. Needs serquel 100mg 

## 2014-12-24 ENCOUNTER — Encounter: Payer: Self-pay | Admitting: Psychiatry

## 2014-12-24 ENCOUNTER — Ambulatory Visit (INDEPENDENT_AMBULATORY_CARE_PROVIDER_SITE_OTHER): Payer: 59 | Admitting: Psychiatry

## 2014-12-24 VITALS — BP 124/88 | HR 62 | Temp 97.2°F | Ht 60.0 in | Wt 189.4 lb

## 2014-12-24 DIAGNOSIS — F410 Panic disorder [episodic paroxysmal anxiety] without agoraphobia: Secondary | ICD-10-CM | POA: Diagnosis not present

## 2014-12-24 DIAGNOSIS — F329 Major depressive disorder, single episode, unspecified: Secondary | ICD-10-CM | POA: Diagnosis not present

## 2014-12-24 DIAGNOSIS — F32A Depression, unspecified: Secondary | ICD-10-CM

## 2014-12-24 MED ORDER — QUETIAPINE FUMARATE 100 MG PO TABS: 100.0000 mg | ORAL_TABLET | Freq: Every day | ORAL | Status: DC

## 2014-12-24 MED ORDER — BELSOMRA 20 MG PO TABS: 20.0000 mg | ORAL_TABLET | Freq: Every evening | ORAL | Status: DC | PRN

## 2014-12-24 MED ORDER — CITALOPRAM HYDROBROMIDE 40 MG PO TABS: 40.0000 mg | ORAL_TABLET | Freq: Every day | ORAL | Status: DC

## 2014-12-24 MED ORDER — LORAZEPAM 1 MG PO TABS: 1.0000 mg | ORAL_TABLET | Freq: Three times a day (TID) | ORAL | Status: DC | PRN

## 2014-12-24 NOTE — Progress Notes (Signed)
BH MD/PA/NP OP Progress Note  12/24/2014 10:35 AM [name]  MRN:  [id number]  Subjective:  Patient returns for follow-up of her major depression, panic disorder and insomnia. Patient states things continue to go well. She continues to watch her grandchildren after school. She relates that her grandson is 70 years old and her granddaughter is 4 years old. She states she enjoys them. However she does states that her grandson is a bit more active. States her granddaughter is calm and reads a lot. She is able to enjoy things and discuss attending a movie with her grandson this past weekend and enjoying it.   He had metabolic labs done by a primary care. Her total cholesterol was good at 145 however she had elevated triglycerides at 218. I encouraged her to talk about this with her primary care physician. Her TSH was normal at 1.021. However patient states that on ultrasound she was told she was hypothyroid?  Patient feels the medications have been helpful. She states that she be alprazolam she might use once or twice a day. She states one issue for her is that she does not know anyone here as she is from [place]. However she states that she knows she could get involved with groups to meet others. but she simply has not done so yet.   States she did go on a vacation out to [place] on as well as [place] and enjoyed that and enjoyed visiting friends in that area.  He indicated that Chief Complaint:  Doing well Chief Complaint    Follow-up; Medication Refill     Visit Diagnosis:     ICD-9-CM ICD-10-CM   1. 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 Depression 311 F32.9     Past Medical History:  Past Medical History  Diagnosis Date  . Anxiety   . Depression   . Thyroid disease   . Diabetes mellitus, type II [facility])     Past Surgical History  Procedure Laterality Date  . Breast biopsy    . Abdominal hysterectomy     Family History:  Family History  Problem Relation  Age of Onset  . Anxiety disorder Mother   . Colon cancer Mother   . Heart disease Father   . Anxiety disorder Father   . Obesity Sister   . Anxiety disorder Sister   . Depression Sister   . Osteoporosis Sister    Social History:  Social History   Social History  . Marital Status: Widowed    Spouse Name: N/A  . Number of Children: N/A  . Years of Education: N/A   Social History Main Topics  . Smoking status: Never Smoker   . Smokeless tobacco: Never Used  . Alcohol Use: No  . Drug Use: No  . Sexual Activity: No   Other Topics Concern  . None   Social History Narrative   Additional History:   Assessment:   Musculoskeletal: Strength & Muscle Tone: within normal limits Gait & Station: normal Patient leans: N/A  Psychiatric Specialty Exam: Anxiety Patient reports no insomnia (Stable with treatment with Belsomra), nervous/anxious behavior or suicidal ideas.      Review of Systems  Psychiatric/Behavioral: Negative for depression, suicidal ideas, hallucinations, memory loss and substance abuse. The patient is not nervous/anxious and does not have insomnia (Stable with treatment with Belsomra).   All other systems reviewed and are negative.   Blood pressure 124/88, pulse 62, temperature 97.2 F (36.2 C), temperature source Tympanic, height 5' (  1.524 m), weight 189 lb 6.4 oz (85.911 kg), SpO2 94 %.Body mass index is 36.99 kg/(m^2).  General Appearance: Neat and Well Groomed  Eye Contact:  Good  Speech:  Clear and Coherent and Normal Rate  Volume:  Normal  Mood:  Good  Affect:  Congruent, able to smile  Thought Process:  Linear and Logical  Orientation:  Full (Time, Place, and Person)  Thought Content:  Negative  Suicidal Thoughts:  No  Homicidal Thoughts:  No  Memory:  Immediate;   Good Recent;   Good Remote;   Good  Judgement:  Good  Insight:  Good  Psychomotor Activity:  Negative  Concentration:  Good  Recall:  Good  Fund of Knowledge: Good  Language:  Good  Akathisia:  Negative  Handed:  Right unknown  AIMS (if indicated):  Not done  Assets:  Communication Skills Desire for Improvement Social Support  ADL's:  Intact  Cognition: WNL  Sleep:  Good    Is the patient at risk to self?  No. Has the patient been a risk to self in the past 6 months?  No. Has the patient been a risk to self within the distant past?  No. Is the patient a risk to others?  No. Has the patient been a risk to others in the past 6 months?  No. Has the patient been a risk to others within the distant past?  No.  Current Medications: Current Outpatient Prescriptions  Medication Sig Dispense Refill  . aspirin 81 MG tablet Take 1 tablet by mouth daily.    . BELSOMRA 20 MG TABS Take 20 mg by mouth at bedtime as needed. 30 tablet 3  . BOOSTRIX 5-2.5-18.5 injection inject 0.5 milliliter intramuscularly  0  . citalopram (CELEXA) 40 MG tablet Take 1 tablet (40 mg total) by mouth daily. 30 tablet 3  . diltiazem (CARDIZEM CD) 240 MG 24 hr capsule Take 240 mg by mouth daily.  3  . FLUZONE HIGH-DOSE 0.5 ML SUSY inject 0.5 milliliter intramuscularly  0  . levothyroxine (SYNTHROID, LEVOTHROID) 50 MCG tablet Take 50 mcg by mouth every morning.  5  . lisinopril (PRINIVIL,ZESTRIL) 20 MG tablet Take 20 mg by mouth daily.  5  . LORazepam (ATIVAN) 1 MG tablet Take 1 tablet (1 mg total) by mouth 3 (three) times daily as needed. 90 tablet 3  . meloxicam (MOBIC) 7.5 MG tablet Take by mouth.    . metFORMIN (GLUCOPHAGE) 500 MG tablet Take 500 mg by mouth daily.  3  . metoprolol succinate (TOPROL-XL) 25 MG 24 hr tablet Take by mouth.    . metoprolol tartrate (LOPRESSOR) 25 MG tablet TAKE 1 TWICE DAILY  5  . nitrofurantoin, macrocrystal-monohydrate, (MACROBID) 100 MG capsule Take 100 mg by mouth 2 (two) times daily.  0  . pantoprazole (PROTONIX) 40 MG tablet Take by mouth.    . QUEtiapine (SEROQUEL) 100 MG tablet Take 1 tablet (100 mg total) by mouth at bedtime. 30 tablet 3  .  simvastatin (ZOCOR) 40 MG tablet Take 40 mg by mouth at bedtime.  4  . sucralfate (CARAFATE) 1 G tablet Take by mouth.    . zolpidem (AMBIEN) 10 MG tablet Take 1 tablet by mouth at bedtime.     No current facility-administered medications for this visit.    Medical Decision Making:  Established Problem, Stable/Improving (1)  Treatment Plan Summary:Medication management patient has been stable on this regimen.   Depressive disorder, recurrent, moderate patient's mood is stable. Will continue  the patient's medications without any changes. We'll continue Celexa 40 mg a day, Seroquel 100 mg at bedtime.  Panic disorder without agoraphobia-Celexa as above and lorazepam 1 mg 3 times a day as needed and Seroquel 100 mg at bedtime.    Insomnia  Belsomra 20 mg at bedtime as needed from her primary care physician.    She will follow up in 3 months. Encouraged call with questions or concerns prior to her next appointment.   Faith Rogue 12/24/2014, 10:35 AM

## 2015-01-08 NOTE — Progress Notes (Signed)
Medications were refilled on  12-24-14

## 2015-01-15 ENCOUNTER — Other Ambulatory Visit: Payer: Self-pay | Admitting: Psychiatry

## 2015-01-19 NOTE — Telephone Encounter (Signed)
this came from pharmacy themself I did not received a fax or pt call

## 2015-01-20 ENCOUNTER — Telehealth: Payer: Self-pay

## 2015-01-20 ENCOUNTER — Telehealth: Payer: Self-pay | Admitting: Psychiatry

## 2015-01-20 NOTE — Telephone Encounter (Signed)
rx was called into pharmacy.

## 2015-01-20 NOTE — Telephone Encounter (Signed)
pt  daughter called states that the pharmacy did not get rx for lorazepam can it please be resent.

## 2015-01-20 NOTE — Telephone Encounter (Signed)
called [facility] spoke with shane- called in rx. #[phone] oked 3 refills

## 2015-01-20 NOTE — Telephone Encounter (Signed)
left message to call office back.  

## 2015-01-21 NOTE — Telephone Encounter (Signed)
spoke with daughter, they picked up the rx yesterday.  everythings good.

## 2015-03-13 ENCOUNTER — Encounter: Payer: Self-pay | Admitting: Emergency Medicine

## 2015-03-13 ENCOUNTER — Emergency Department: Payer: Medicare Other

## 2015-03-13 ENCOUNTER — Emergency Department
Admission: EM | Admit: 2015-03-13 | Discharge: 2015-03-13 | Disposition: A | Discharge status: Home / Self Care | Payer: Medicare Other | Attending: Emergency Medicine | Admitting: Emergency Medicine

## 2015-03-13 DIAGNOSIS — J209 Acute bronchitis, unspecified: Secondary | ICD-10-CM | POA: Insufficient documentation

## 2015-03-13 DIAGNOSIS — E119 Type 2 diabetes mellitus without complications: Secondary | ICD-10-CM | POA: Diagnosis not present

## 2015-03-13 DIAGNOSIS — Z7982 Long term (current) use of aspirin: Secondary | ICD-10-CM | POA: Insufficient documentation

## 2015-03-13 DIAGNOSIS — Z79899 Other long term (current) drug therapy: Secondary | ICD-10-CM | POA: Diagnosis not present

## 2015-03-13 DIAGNOSIS — I10 Essential (primary) hypertension: Secondary | ICD-10-CM | POA: Insufficient documentation

## 2015-03-13 DIAGNOSIS — Z791 Long term (current) use of non-steroidal anti-inflammatories (NSAID): Secondary | ICD-10-CM | POA: Insufficient documentation

## 2015-03-13 DIAGNOSIS — Z7984 Long term (current) use of oral hypoglycemic drugs: Secondary | ICD-10-CM | POA: Insufficient documentation

## 2015-03-13 DIAGNOSIS — Z792 Long term (current) use of antibiotics: Secondary | ICD-10-CM | POA: Diagnosis not present

## 2015-03-13 DIAGNOSIS — R05 Cough: Secondary | ICD-10-CM | POA: Diagnosis present

## 2015-03-13 DIAGNOSIS — J40 Bronchitis, not specified as acute or chronic: Secondary | ICD-10-CM

## 2015-03-13 HISTORY — DX: Gastro-esophageal reflux disease without esophagitis: K21.9

## 2015-03-13 MED ORDER — HYDROCODONE-HOMATROPINE 5-1.5 MG/5ML PO SYRP: 5.0000 mL | ORAL_SOLUTION | Freq: Four times a day (QID) | ORAL | Status: DC | PRN

## 2015-03-13 MED ORDER — AMOXICILLIN 500 MG PO CAPS
500.0000 mg | ORAL_CAPSULE | Freq: Once | ORAL | Status: AC
  Administered 2015-03-13: 500 mg via ORAL
  Filled 2015-03-13: qty 1

## 2015-03-13 MED ORDER — AMOXICILLIN 500 MG PO TABS
500.0000 mg | ORAL_TABLET | Freq: Three times a day (TID) | ORAL | Status: DC
Start: 2015-03-13 — End: 2015-03-13

## 2015-03-13 MED ORDER — HYDROCODONE-ACETAMINOPHEN 5-325 MG PO TABS: 1.0000 | ORAL_TABLET | ORAL | Status: DC | PRN

## 2015-03-13 MED ORDER — AMOXICILLIN 500 MG PO TABS: 500.0000 mg | ORAL_TABLET | Freq: Three times a day (TID) | ORAL | Status: DC

## 2015-03-13 NOTE — ED Notes (Signed)
Describes recent head cold.  Today awoke with sinus congestion, sore throat and cough.  Cough is non productive.

## 2015-03-13 NOTE — Discharge Instructions (Signed)
Take antibiotics and cough medicine as directed. Follow-up with your physician if not improving. Return to the emergency room for any worsening symptoms.

## 2015-03-13 NOTE — ED Provider Notes (Signed)
[facility] Emergency Department Provider Note  ____________________________________________  Time seen: Approximately 7:27 PM  I have reviewed the triage vital signs and the nursing notes.   HISTORY  Chief Complaint Cough and Sore Throat    HPI [name] is a 70 y.o. female with history of diabetes, pneumonia who presents with 10 days of URI symptoms including head congestion and drainage. She has had worsening cough today with chest tightness, shortness of breath. No pleuritic pain or chest pain. No current fevers or chills. She has mild headache and sore throat. No abdominal pain, nausea, diarrhea.   Past Medical History  Diagnosis Date  . Anxiety   . Depression   . Thyroid disease   . Diabetes mellitus, type II ([place])   . GERD (gastroesophageal reflux disease)     Patient Active Problem List   Diagnosis Date Noted  . Depression, major, recurrent, moderate ([place]) 07/22/2014  . Anxiety, generalized 07/22/2014  . Insomnia, persistent 07/22/2014  . Panic disorder without agoraphobia 07/22/2014  . H/O: obesity 06/01/2014  . H/O: HTN (hypertension) 06/01/2014  . H/O gastrointestinal disease 06/01/2014  . H/O diabetes mellitus 06/01/2014  . H/O elevated lipids 06/01/2014  . History of hay fever 06/01/2014    Past Surgical History  Procedure Laterality Date  . Breast biopsy    . Abdominal hysterectomy      Current Outpatient Rx  Name  Route  Sig  Dispense  Refill  . amoxicillin (AMOXIL) 500 MG tablet   Oral   Take 1 tablet (500 mg total) by mouth 3 (three) times daily.   30 tablet   0   . aspirin 81 MG tablet   Oral   Take 1 tablet by mouth daily.         . BELSOMRA 20 MG TABS   Oral   Take 20 mg by mouth at bedtime as needed.   30 tablet   3     Dispense as written.   Marland Kitchen BOOSTRIX 5-2.5-18.5 injection      inject 0.5 milliliter intramuscularly      0     Dispense as written.   . citalopram (CELEXA) 40 MG tablet  Oral   Take 1 tablet (40 mg total) by mouth daily.   30 tablet   3   . diltiazem (CARDIZEM CD) 240 MG 24 hr capsule   Oral   Take 240 mg by mouth daily.      3   . FLUZONE HIGH-DOSE 0.5 ML SUSY      inject 0.5 milliliter intramuscularly      0     Dispense as written.   Marland Kitchen HYDROcodone-acetaminophen (NORCO) 5-325 MG tablet   Oral   Take 1 tablet by mouth every 4 (four) hours as needed for moderate pain.   20 tablet   0   . levothyroxine (SYNTHROID, LEVOTHROID) 50 MCG tablet   Oral   Take 50 mcg by mouth every morning.      5   . lisinopril (PRINIVIL,ZESTRIL) 20 MG tablet   Oral   Take 20 mg by mouth daily.      5   . LORazepam (ATIVAN) 1 MG tablet   Oral   Take 1 tablet (1 mg total) by mouth 3 (three) times daily as needed.   90 tablet   3   . meloxicam (MOBIC) 7.5 MG tablet   Oral   Take by mouth.         . metFORMIN (  GLUCOPHAGE) 500 MG tablet   Oral   Take 500 mg by mouth daily.      3   . metoprolol succinate (TOPROL-XL) 25 MG 24 hr tablet   Oral   Take by mouth.         . metoprolol tartrate (LOPRESSOR) 25 MG tablet      TAKE 1 TWICE DAILY      5   . nitrofurantoin, macrocrystal-monohydrate, (MACROBID) 100 MG capsule   Oral   Take 100 mg by mouth 2 (two) times daily.      0   . pantoprazole (PROTONIX) 40 MG tablet   Oral   Take by mouth.         . QUEtiapine (SEROQUEL) 100 MG tablet   Oral   Take 1 tablet (100 mg total) by mouth at bedtime.   30 tablet   3   . simvastatin (ZOCOR) 40 MG tablet   Oral   Take 40 mg by mouth at bedtime.      4   . sucralfate (CARAFATE) 1 G tablet   Oral   Take by mouth.         . zolpidem (AMBIEN) 10 MG tablet   Oral   Take 1 tablet by mouth at bedtime.           Allergies Review of patient's allergies indicates no known allergies.  Family History  Problem Relation Age of Onset  . Anxiety disorder Mother   . Colon cancer Mother   . Heart disease Father   . Anxiety disorder  Father   . Obesity Sister   . Anxiety disorder Sister   . Depression Sister   . Osteoporosis Sister     Social History Social History  Substance Use Topics  . Smoking status: Never Smoker   . Smokeless tobacco: Never Used  . Alcohol Use: No    Review of Systems Constitutional: No fever/chills Eyes: No visual changes. ENT: per  HPI Cardiovascular: Denies chest pain. Respiratory: Denies shortness of breath. Gastrointestinal: No abdominal pain.  No nausea, no vomiting.  No diarrhea.  No constipation. Skin: Negative for rash. Neurological: Negative for headaches, focal weakness or numbness. 10-point ROS otherwise negative.  ____________________________________________   PHYSICAL EXAM:  VITAL SIGNS: ED Triage Vitals  Enc Vitals Group     BP 03/13/15 1846 147/75 mmHg     Pulse Rate 03/13/15 1846 72     Resp 03/13/15 1846 18     Temp 03/13/15 1846 98.5 F (36.9 C)     Temp Source 03/13/15 1846 Oral     SpO2 03/13/15 1846 95 %     Weight 03/13/15 1846 170 lb (77.111 kg)     Height 03/13/15 1846 5' (1.524 m)     Head Cir --      Peak Flow --      Pain Score 03/13/15 1848 0     Pain Loc --      Pain Edu? --      Excl. in [place]? --     Constitutional: Alert and oriented. Well appearing and in no acute distress. Eyes: Conjunctivae are normal. PERRL. EOMI. Ears:  Clear with normal landmarks. No erythema. Head: Atraumatic. Nose: No congestion/rhinnorhea. Mouth/Throat: Mucous membranes are moist.  Oropharynx non-erythematous. No lesions. Neck:  Supple.  No adenopathy.   Cardiovascular: Normal rate, regular rhythm. Grossly normal heart sounds.  Good peripheral circulation. Respiratory: Normal respiratory effort.  Few rhonchi, no wheezing. Gastrointestinal: Soft and nontender. No distention.  No abdominal bruits. No CVA tenderness. Musculoskeletal: Nml ROM of upper and lower extremity joints. Neurologic:  Normal speech and language. No gross focal neurologic deficits are  appreciated. No gait instability. Skin:  Skin is warm, dry and intact. No rash noted. Psychiatric: Mood and affect are normal. Speech and behavior are normal.  ____________________________________________   LABS (all labs ordered are listed, but only abnormal results are displayed)  Labs Reviewed - No data to display ____________________________________________  EKG    ____________________________________________  RADIOLOGY  CLINICAL DATA: Recent cold. Sinus congestion and sore throat. Cough  EXAM: CHEST 2 VIEW  COMPARISON: 12/22/2012  FINDINGS: Normal heart size. No pleural effusion or edema. Large hiatal hernia is identified. No airspace consolidation.  IMPRESSION: 1. No evidence for pneumonia. 2. Hiatal hernia.   Electronically Signed  By: Kerby Moors M.D.  On: 03/13/2015 19:42 ____________________________________________   PROCEDURES  Procedure(s) performed: None  Critical Care performed: No  ____________________________________________   INITIAL IMPRESSION / ASSESSMENT AND PLAN / ED COURSE  Pertinent labs & imaging results that were available during my care of the patient were reviewed by me and considered in my medical decision making (see chart for details).  70 year old female with history of pneumonia presents with over 10 days of URI symptoms. She has developed a worsening cough with some chest tightness. She is treated for bronchitis. She has a stable x-ray. She is given amoxicillin and Hycodan. She will follow-up with her physician if not improving. ____________________________________________   FINAL CLINICAL IMPRESSION(S) / ED DIAGNOSES  Final diagnoses:  Bronchitis      Mortimer Fries, PA-C 03/13/15 2001  Mortimer Fries, PA-C 03/13/15 2004  Nena Polio, MD 03/13/15 2330

## 2015-03-24 ENCOUNTER — Ambulatory Visit
Admission: RE | Admit: 2015-03-24 | Discharge: 2015-03-24 | Disposition: A | Discharge status: Home / Self Care | Payer: Medicare Other | Source: Ambulatory Visit | Attending: Physician Assistant | Admitting: Physician Assistant

## 2015-03-24 ENCOUNTER — Other Ambulatory Visit: Payer: Self-pay | Admitting: Physician Assistant

## 2015-03-24 DIAGNOSIS — R05 Cough: Secondary | ICD-10-CM | POA: Insufficient documentation

## 2015-03-24 DIAGNOSIS — K449 Diaphragmatic hernia without obstruction or gangrene: Secondary | ICD-10-CM | POA: Diagnosis not present

## 2015-03-24 DIAGNOSIS — R0602 Shortness of breath: Secondary | ICD-10-CM | POA: Diagnosis not present

## 2015-03-24 DIAGNOSIS — R053 Chronic cough: Secondary | ICD-10-CM

## 2015-03-26 ENCOUNTER — Ambulatory Visit: Payer: 59 | Admitting: Psychiatry

## 2015-03-30 ENCOUNTER — Encounter: Payer: Self-pay | Admitting: Psychiatry

## 2015-03-30 ENCOUNTER — Ambulatory Visit (INDEPENDENT_AMBULATORY_CARE_PROVIDER_SITE_OTHER): Payer: 59 | Admitting: Psychiatry

## 2015-03-30 DIAGNOSIS — F411 Generalized anxiety disorder: Secondary | ICD-10-CM | POA: Diagnosis not present

## 2015-03-30 MED ORDER — CITALOPRAM HYDROBROMIDE 40 MG PO TABS: 40.0000 mg | ORAL_TABLET | Freq: Every day | ORAL | Status: DC

## 2015-03-30 MED ORDER — QUETIAPINE FUMARATE 100 MG PO TABS: 100.0000 mg | ORAL_TABLET | Freq: Every day | ORAL | Status: DC

## 2015-03-30 MED ORDER — LORAZEPAM 0.5 MG PO TABS: 0.5000 mg | ORAL_TABLET | Freq: Two times a day (BID) | ORAL | Status: DC | PRN

## 2015-03-30 NOTE — Progress Notes (Signed)
Patient ID: [name], female   DOB: 05/08/1945, 70 y.o.   MRN: [id number] [facility] MD/PA/NP OP Progress Note  03/30/2015 3:01 PM [name]  MRN:  [id number]  Subjective:  Patient is a 70 year old female with major depressive disorder, panic disorder and insomnia. She was previously seen by Dr. Jimmye Norman. This the first visit with this patient with this physician. She reports being compliant with her medications. States she takes the lorazepam 1 mg twice daily and not 3 times. States she takes it for her panic attacks and was unsure about the frequency of her panic attacks .Reports that she sleeps well and not eating well. States she was sick with Bronchitis. States that has affected her appetite. She still watches her grandchildren. States her mood is good and anxiety under control. Denies any self harm thoughts.   Chief Complaint:  Doing well Chief Complaint    Follow-up; Medication Refill     Visit Diagnosis:   No diagnosis found.  Past Medical History:  Past Medical History  Diagnosis Date  . Anxiety   . Depression   . Thyroid disease   . Diabetes mellitus, type II ([place])   . GERD (gastroesophageal reflux disease)     Past Surgical History  Procedure Laterality Date  . Breast biopsy    . Abdominal hysterectomy     Family History:  Family History  Problem Relation Age of Onset  . Anxiety disorder Mother   . Colon cancer Mother   . Heart disease Father   . Anxiety disorder Father   . Obesity Sister   . Anxiety disorder Sister   . Depression Sister   . Osteoporosis Sister    Social History:  Social History   Social History  . Marital Status: Widowed    Spouse Name: N/A  . Number of Children: N/A  . Years of Education: N/A   Social History Main Topics  . Smoking status: Never Smoker   . Smokeless tobacco: Never Used  . Alcohol Use: No  . Drug Use: No  . Sexual Activity: No   Other Topics Concern  . None   Social History Narrative   Additional  History:   Assessment:   Musculoskeletal: Strength & Muscle Tone: within normal limits Gait & Station: normal Patient leans: N/A  Psychiatric Specialty Exam: Anxiety Patient reports no insomnia (Stable with treatment with Belsomra), nervous/anxious behavior or suicidal ideas.      Review of Systems  Psychiatric/Behavioral: Negative for depression, suicidal ideas, hallucinations, memory loss and substance abuse. The patient is not nervous/anxious and does not have insomnia (Stable with treatment with Belsomra).   All other systems reviewed and are negative.   Blood pressure 144/88, pulse 86, temperature 99 F (37.2 C), temperature source Tympanic, height 5' (1.524 m), weight 181 lb 6.4 oz (82.283 kg), SpO2 95 %.Body mass index is 35.43 kg/(m^2).  General Appearance: Neat and Well Groomed  Eye Contact:  Good  Speech:  Clear and Coherent and Normal Rate  Volume:  Normal  Mood:  Good  Affect:  Congruent, able to smile  Thought Process:  Linear and Logical  Orientation:  Full (Time, Place, and Person)  Thought Content:  Negative  Suicidal Thoughts:  No  Homicidal Thoughts:  No  Memory:  Immediate;   Good Recent;   Good Remote;   Good  Judgement:  Good  Insight:  Good  Psychomotor Activity:  Negative  Concentration:  Good  Recall:  Good  Fund of Knowledge: Good  Language: Good  Akathisia:  Negative  Handed:  Right unknown  AIMS (if indicated):  Not done  Assets:  Communication Skills Desire for Improvement Social Support  ADL's:  Intact  Cognition: WNL  Sleep:  Good    Is the patient at risk to self?  No. Has the patient been a risk to self in the past 6 months?  No. Has the patient been a risk to self within the distant past?  No. Is the patient a risk to others?  No. Has the patient been a risk to others in the past 6 months?  No. Has the patient been a risk to others within the distant past?  No.  Current Medications: Current Outpatient Prescriptions   Medication Sig Dispense Refill  . aspirin 81 MG tablet Take 1 tablet by mouth daily.    . BELSOMRA 20 MG TABS Take 20 mg by mouth at bedtime as needed. 30 tablet 3  . BOOSTRIX 5-2.5-18.5 injection inject 0.5 milliliter intramuscularly  0  . citalopram (CELEXA) 40 MG tablet Take 1 tablet (40 mg total) by mouth daily. 30 tablet 3  . diltiazem (CARDIZEM CD) 240 MG 24 hr capsule Take 240 mg by mouth daily.  3  . FLUZONE HIGH-DOSE 0.5 ML SUSY inject 0.5 milliliter intramuscularly  0  . HYDROcodone-homatropine (HYCODAN) 5-1.5 MG/5ML syrup Take 5 mLs by mouth every 6 (six) hours as needed for cough. 120 mL 0  . levothyroxine (SYNTHROID, LEVOTHROID) 50 MCG tablet Take 50 mcg by mouth every morning.  5  . lisinopril (PRINIVIL,ZESTRIL) 20 MG tablet Take 20 mg by mouth daily.  5  . LORazepam (ATIVAN) 1 MG tablet Take 1 tablet (1 mg total) by mouth 3 (three) times daily as needed. 90 tablet 3  . meloxicam (MOBIC) 7.5 MG tablet Take by mouth.    . metFORMIN (GLUCOPHAGE) 500 MG tablet Take 500 mg by mouth daily.  3  . metoprolol succinate (TOPROL-XL) 25 MG 24 hr tablet Take by mouth.    . metoprolol tartrate (LOPRESSOR) 25 MG tablet TAKE 1 TWICE DAILY  5  . nitrofurantoin, macrocrystal-monohydrate, (MACROBID) 100 MG capsule Take 100 mg by mouth 2 (two) times daily.  0  . pantoprazole (PROTONIX) 40 MG tablet Take by mouth.    . QUEtiapine (SEROQUEL) 100 MG tablet Take 1 tablet (100 mg total) by mouth at bedtime. 30 tablet 3  . simvastatin (ZOCOR) 40 MG tablet Take 40 mg by mouth at bedtime.  4  . sucralfate (CARAFATE) 1 G tablet Take by mouth.    . zolpidem (AMBIEN) 10 MG tablet Take 1 tablet by mouth at bedtime.     No current facility-administered medications for this visit.    Medical Decision Making:  Established Problem, Stable/Improving (1)  Treatment Plan Summary:Medication management   Depressive disorder, recurrent, moderate patient's mood is stable. Continue Celexa 40 mg a day, Seroquel 100  mg at bedtime.  Panic disorder without agoraphobia-Celexa as above and lorazepam decreased to 0.5 mg 2 times daily  and Seroquel 100 mg at bedtime.   Educated patient about the risks of taking lorazepam at dosages of 1 mg multiple times a day and increased risk for falls, sedation and cognitive and memory impairment.  Insomnia  Belsomra 20 mg at bedtime as needed from her primary care physician.    She will follow up in 2 months. Encouraged call with questions or concerns prior to her next appointment.   Heavan Francom 03/30/2015, 3:01 PM

## 2015-04-01 ENCOUNTER — Ambulatory Visit: Payer: 59 | Admitting: Psychiatry

## 2015-05-28 ENCOUNTER — Ambulatory Visit: Payer: 59 | Admitting: Psychiatry

## 2015-06-25 ENCOUNTER — Encounter: Payer: Self-pay | Admitting: Psychiatry

## 2015-06-25 ENCOUNTER — Ambulatory Visit (INDEPENDENT_AMBULATORY_CARE_PROVIDER_SITE_OTHER): Payer: 59 | Admitting: Psychiatry

## 2015-06-25 VITALS — BP 128/78 | HR 59 | Temp 97.6°F | Ht 60.0 in | Wt 188.8 lb

## 2015-06-25 DIAGNOSIS — F410 Panic disorder [episodic paroxysmal anxiety] without agoraphobia: Secondary | ICD-10-CM | POA: Diagnosis not present

## 2015-06-25 DIAGNOSIS — F32A Depression, unspecified: Secondary | ICD-10-CM

## 2015-06-25 DIAGNOSIS — F329 Major depressive disorder, single episode, unspecified: Secondary | ICD-10-CM | POA: Diagnosis not present

## 2015-06-25 MED ORDER — QUETIAPINE FUMARATE 100 MG PO TABS: 100.0000 mg | ORAL_TABLET | Freq: Every day | ORAL | Status: DC

## 2015-06-25 MED ORDER — LORAZEPAM 0.5 MG PO TABS: ORAL_TABLET | ORAL | Status: DC

## 2015-06-25 MED ORDER — CITALOPRAM HYDROBROMIDE 40 MG PO TABS: 40.0000 mg | ORAL_TABLET | Freq: Every day | ORAL | Status: DC

## 2015-06-25 NOTE — Progress Notes (Signed)
Patient ID: [name], female   DOB: [birth year], 70 y.o.   MRN: [id number]  [facility] MD/PA/NP OP Progress Note  06/25/2015 1:07 PM [name]  MRN:  [id number]  Subjective:  Patient is a 70 year old female with major depressive disorder, panic disorder and insomnia. She reports being compliant with her medications..Reports that she sleeps well and  eating well. States that she has cut her lorazepam 0.5 mg once daily  and is taking it mostly every day. Discussed with her that we will cut it down to 2-3 times a week at the most as needed. States she is enjoying her life she and her daughter travel and the they're living in an apartment currently. States her mood is good and anxiety under control. Denies any self harm thoughts.   Chief Complaint:  Doing well  Visit Diagnosis:     ICD-9-CM ICD-10-CM   1. 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 Depression 311 F32.9     Past Medical History:  Past Medical History  Diagnosis Date  . Anxiety   . Depression   . Thyroid disease   . Diabetes mellitus, type II ([place])   . GERD (gastroesophageal reflux disease)     Past Surgical History  Procedure Laterality Date  . Breast biopsy    . Abdominal hysterectomy     Family History:  Family History  Problem Relation Age of Onset  . Anxiety disorder Mother   . Colon cancer Mother   . Heart disease Father   . Anxiety disorder Father   . Obesity Sister   . Anxiety disorder Sister   . Depression Sister   . Osteoporosis Sister    Social History:  Social History   Social History  . Marital Status: Widowed    Spouse Name: N/A  . Number of Children: N/A  . Years of Education: N/A   Social History Main Topics  . Smoking status: Never Smoker   . Smokeless tobacco: Never Used  . Alcohol Use: No  . Drug Use: No  . Sexual Activity: No   Other Topics Concern  . Not on file   Social History Narrative   Additional History:   Assessment:   Musculoskeletal: Strength  & Muscle Tone: within normal limits Gait & Station: normal Patient leans: N/A  Psychiatric Specialty Exam: Anxiety Patient reports no insomnia (Stable with treatment with Belsomra), nervous/anxious behavior or suicidal ideas.      Review of Systems  Psychiatric/Behavioral: Negative for depression, suicidal ideas, hallucinations, memory loss and substance abuse. The patient is not nervous/anxious and does not have insomnia (Stable with treatment with Belsomra).   All other systems reviewed and are negative.   There were no vitals taken for this visit.There is no weight on file to calculate BMI.  General Appearance: Neat and Well Groomed  Eye Contact:  Good  Speech:  Clear and Coherent and Normal Rate  Volume:  Normal  Mood:  Good  Affect:  Congruent, able to smile  Thought Process:  Linear and Logical  Orientation:  Full (Time, Place, and Person)  Thought Content:  Negative  Suicidal Thoughts:  No  Homicidal Thoughts:  No  Memory:  Immediate;   Good Recent;   Good Remote;   Good  Judgement:  Good  Insight:  Good  Psychomotor Activity:  Negative  Concentration:  Good  Recall:  Good  Fund of Knowledge: Good  Language: Good  Akathisia:  Negative  Handed:  Right unknown  AIMS (if  indicated):  Not done  Assets:  Communication Skills Desire for Improvement Social Support  ADL's:  Intact  Cognition: WNL  Sleep:  Good    Is the patient at risk to self?  No. Has the patient been a risk to self in the past 6 months?  No. Has the patient been a risk to self within the distant past?  No. Is the patient a risk to others?  No. Has the patient been a risk to others in the past 6 months?  No. Has the patient been a risk to others within the distant past?  No.  Current Medications: Current Outpatient Prescriptions  Medication Sig Dispense Refill  . aspirin 81 MG tablet Take 1 tablet by mouth daily.    . BELSOMRA 20 MG TABS Take 20 mg by mouth at bedtime as needed. 30 tablet 3   . BOOSTRIX 5-2.5-18.5 injection inject 0.5 milliliter intramuscularly  0  . citalopram (CELEXA) 40 MG tablet Take 1 tablet (40 mg total) by mouth daily. 30 tablet 3  . diltiazem (CARDIZEM CD) 240 MG 24 hr capsule Take 240 mg by mouth daily.  3  . FLUZONE HIGH-DOSE 0.5 ML SUSY inject 0.5 milliliter intramuscularly  0  . HYDROcodone-homatropine (HYCODAN) 5-1.5 MG/5ML syrup Take 5 mLs by mouth every 6 (six) hours as needed for cough. 120 mL 0  . levothyroxine (SYNTHROID, LEVOTHROID) 50 MCG tablet Take 50 mcg by mouth every morning.  5  . lisinopril (PRINIVIL,ZESTRIL) 20 MG tablet Take 20 mg by mouth daily.  5  . LORazepam (ATIVAN) 0.5 MG tablet Take 1 tablet (0.5 mg total) by mouth 2 (two) times daily as needed for anxiety. 60 tablet 1  . meloxicam (MOBIC) 7.5 MG tablet Take by mouth.    . metFORMIN (GLUCOPHAGE) 500 MG tablet Take 500 mg by mouth daily.  3  . metoprolol succinate (TOPROL-XL) 25 MG 24 hr tablet Take by mouth.    . metoprolol tartrate (LOPRESSOR) 25 MG tablet TAKE 1 TWICE DAILY  5  . nitrofurantoin, macrocrystal-monohydrate, (MACROBID) 100 MG capsule Take 100 mg by mouth 2 (two) times daily.  0  . pantoprazole (PROTONIX) 40 MG tablet Take by mouth.    . QUEtiapine (SEROQUEL) 100 MG tablet Take 1 tablet (100 mg total) by mouth at bedtime. 30 tablet 2  . simvastatin (ZOCOR) 40 MG tablet Take 40 mg by mouth at bedtime.  4  . sucralfate (CARAFATE) 1 G tablet Take by mouth.    . zolpidem (AMBIEN) 10 MG tablet Take 1 tablet by mouth at bedtime.     No current facility-administered medications for this visit.    Medical Decision Making:  Established Problem, Stable/Improving (1)  Treatment Plan Summary:Medication management   Depressive disorder, recurrent, moderate patient's mood is stable. Continue Celexa 40 mg a day, Seroquel 100 mg at bedtime.  Panic disorder without agoraphobia-Celexa as above and lorazepam decreased to 0.5 mg 2 times weekly as needed  and Seroquel 100 mg at  bedtime.    Insomnia  Belsomra 20 mg at bedtime as needed from her primary care physician.    She will follow up in 3 months. Encouraged call with questions or concerns prior to her next appointment.   Yaviel Kloster 06/25/2015, 1:07 PM

## 2015-07-30 ENCOUNTER — Encounter: Payer: Self-pay | Admitting: Obstetrics and Gynecology

## 2015-09-01 ENCOUNTER — Encounter: Payer: Self-pay | Admitting: Obstetrics and Gynecology

## 2015-09-24 ENCOUNTER — Ambulatory Visit: Payer: 59 | Admitting: Psychiatry

## 2015-11-02 ENCOUNTER — Other Ambulatory Visit: Payer: Self-pay | Admitting: Psychiatry

## 2015-11-17 ENCOUNTER — Encounter: Payer: Self-pay | Admitting: Psychiatry

## 2015-11-17 ENCOUNTER — Ambulatory Visit (INDEPENDENT_AMBULATORY_CARE_PROVIDER_SITE_OTHER): Payer: 59 | Admitting: Psychiatry

## 2015-11-17 VITALS — BP 130/80 | HR 59 | Temp 98.4°F | Wt 180.2 lb

## 2015-11-17 DIAGNOSIS — F5101 Primary insomnia: Secondary | ICD-10-CM | POA: Diagnosis not present

## 2015-11-17 DIAGNOSIS — F320 Major depressive disorder, single episode, mild: Secondary | ICD-10-CM

## 2015-11-17 DIAGNOSIS — F410 Panic disorder [episodic paroxysmal anxiety] without agoraphobia: Secondary | ICD-10-CM | POA: Diagnosis not present

## 2015-11-17 MED ORDER — QUETIAPINE FUMARATE 50 MG PO TABS: 50.0000 mg | ORAL_TABLET | Freq: Two times a day (BID) | ORAL | 1 refills | Status: DC

## 2015-11-17 MED ORDER — CITALOPRAM HYDROBROMIDE 40 MG PO TABS: 40.0000 mg | ORAL_TABLET | Freq: Every day | ORAL | 0 refills | Status: DC

## 2015-11-17 NOTE — Progress Notes (Signed)
Patient ID: [name], female   DOB: February 18, 1945, 70 y.o.   MRN: [id number]  [facility] MD/PA/NP OP Progress Note  11/17/2015 1:56 PM [name]  MRN:  [id number]  Subjective:  Patient is a 70 year old female with major depressive disorder, panic disorder and insomnia. She reports being compliant with her medications..Reports that she sleeps well and  eating well. Patient reports that she has stopped taking the lorazepam completely. However states that she wakes up with some panic-like symptoms each morning and would like for something to help her with that. Discussed with her breaking down the Seroquel to 50 mg twice daily and she is willing to give that a try for now.    Chief Complaint:  Doing well Chief Complaint    Follow-up; Medication Refill     Visit Diagnosis:     ICD-9-CM ICD-10-CM   1. 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 Mild single current episode of major depressive disorder (HCC) 296.21 F32.0   3. 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 Anxiety   . Depression   . Diabetes mellitus, type II ([place])   . GERD (gastroesophageal reflux disease)   . Thyroid disease     Past Surgical History:  Procedure Laterality Date  . ABDOMINAL HYSTERECTOMY    . BREAST BIOPSY     Family History:  Family History  Problem Relation Age of Onset  . Anxiety disorder Mother   . Colon cancer Mother   . Heart disease Father   . Anxiety disorder Father   . Obesity Sister   . Anxiety disorder Sister   . Depression Sister   . Osteoporosis Sister    Social History:  Social History   Social History  . Marital status: Widowed    Spouse name: N/A  . Number of children: N/A  . Years of education: N/A   Social History Main Topics  . Smoking status: Never Smoker  . Smokeless tobacco: Never Used  . Alcohol use No  . Drug use: No  . Sexual activity: No   Other Topics Concern  . None   Social  History Narrative  . None   Additional History:   Assessment:   Musculoskeletal: Strength & Muscle Tone: within normal limits Gait & Station: normal Patient leans: N/A  Psychiatric Specialty Exam: Anxiety  Patient reports no insomnia (Stable with treatment with Belsomra), nervous/anxious behavior or suicidal ideas.    Medication Refill     Review of Systems  Psychiatric/Behavioral: Negative for depression, hallucinations, memory loss, substance abuse and suicidal ideas. The patient is not nervous/anxious and does not have insomnia (Stable with treatment with Belsomra).   All other systems reviewed and are negative.   Blood pressure 130/80, pulse (!) 59, temperature 98.4 F (36.9 C), temperature source Oral, weight 180 lb 3.2 oz (81.7 kg).Body mass index is 35.19 kg/m.  General Appearance: Neat and Well Groomed  Eye Contact:  Good  Speech:  Clear and Coherent and Normal Rate  Volume:  Normal  Mood:  Good  Affect:  Congruent, able to smile  Thought Process:  Linear and Logical  Orientation:  Full (Time, Place, and Person)  Thought Content:  Negative  Suicidal Thoughts:  No  Homicidal Thoughts:  No  Memory:  Immediate;   Good Recent;   Good Remote;   Good  Judgement:  Good  Insight:  Good  Psychomotor Activity:  Negative  Concentration:  Good  Recall:  Good  Fund of Knowledge: Good  Language: Good  Akathisia:  Negative  Handed:  Right unknown  AIMS (if indicated):  Not done  Assets:  Communication Skills Desire for Improvement Social Support  ADL's:  Intact  Cognition: WNL  Sleep:  Good    Is the patient at risk to self?  No. Has the patient been a risk to self in the past 6 months?  No. Has the patient been a risk to self within the distant past?  No. Is the patient a risk to others?  No. Has the patient been a risk to others in the past 6 months?  No. Has the patient been a risk to others within the distant past?  No.  Current Medications: Current  Outpatient Prescriptions  Medication Sig Dispense Refill  . aspirin 81 MG tablet Take 1 tablet by mouth daily.    . BELSOMRA 20 MG TABS Take 20 mg by mouth at bedtime as needed. 30 tablet 3  . BOOSTRIX 5-2.5-18.5 injection inject 0.5 milliliter intramuscularly  0  . citalopram (CELEXA) 40 MG tablet Take 1 tablet (40 mg total) by mouth daily. 90 tablet 0  . diltiazem (CARDIZEM CD) 240 MG 24 hr capsule Take 240 mg by mouth daily.  3  . FLUZONE HIGH-DOSE 0.5 ML SUSY inject 0.5 milliliter intramuscularly  0  . HYDROcodone-homatropine (HYCODAN) 5-1.5 MG/5ML syrup Take 5 mLs by mouth every 6 (six) hours as needed for cough. 120 mL 0  . levothyroxine (SYNTHROID, LEVOTHROID) 50 MCG tablet Take 50 mcg by mouth every morning.  5  . lisinopril (PRINIVIL,ZESTRIL) 20 MG tablet Take 20 mg by mouth daily.  5  . metFORMIN (GLUCOPHAGE) 500 MG tablet Take 500 mg by mouth daily.  3  . metoprolol succinate (TOPROL-XL) 25 MG 24 hr tablet Take by mouth.    . metoprolol tartrate (LOPRESSOR) 25 MG tablet TAKE 1 TWICE DAILY  5  . pantoprazole (PROTONIX) 40 MG tablet Take by mouth.    Marland Kitchen PREVNAR 13 SUSP injection inject 0.5 milliliter intramuscularly  0  . QUEtiapine (SEROQUEL) 50 MG tablet Take 1 tablet (50 mg total) by mouth 2 (two) times daily. 60 tablet 1  . simvastatin (ZOCOR) 40 MG tablet Take 40 mg by mouth at bedtime.  4  . sucralfate (CARAFATE) 1 G tablet Take by mouth.    . VESICARE 5 MG tablet Take 5 mg by mouth at bedtime.  0   No current facility-administered medications for this visit.     Medical Decision Making:  Established Problem, Stable/Improving (1)  Treatment Plan Summary:Medication management   Depressive disorder, recurrent, moderate patient's mood is stable. Continue Celexa 40 mg a day  Panic disorder without agoraphobia Change Seroquel to 50mg  po bid. Discussed the interactions between seroquel and celexa With QT prolongation risk but patient and her daughter stated that she's been  doing well and would like to continue on this combination.  Insomnia  Belsomra 20 mg at bedtime as needed from her primary care physician.   She will follow up in 1 months. Encouraged call with questions or concerns prior to her next appointment.   Charnele Semple 11/17/2015, 1:56 PM

## 2015-12-17 ENCOUNTER — Ambulatory Visit: Payer: 59 | Admitting: Psychiatry

## 2015-12-30 ENCOUNTER — Other Ambulatory Visit
Admission: RE | Admit: 2015-12-30 | Discharge: 2015-12-30 | Disposition: A | Discharge status: Home / Self Care | Payer: Medicare Other | Source: Ambulatory Visit | Attending: Physician Assistant | Admitting: Physician Assistant

## 2015-12-30 DIAGNOSIS — E039 Hypothyroidism, unspecified: Secondary | ICD-10-CM | POA: Diagnosis present

## 2015-12-30 DIAGNOSIS — E785 Hyperlipidemia, unspecified: Secondary | ICD-10-CM | POA: Insufficient documentation

## 2015-12-30 DIAGNOSIS — E119 Type 2 diabetes mellitus without complications: Secondary | ICD-10-CM | POA: Insufficient documentation

## 2015-12-30 LAB — COMPREHENSIVE METABOLIC PANEL
ALBUMIN: 4.2 g/dL (ref 3.5–5.0)
ALK PHOS: 60 U/L (ref 38–126)
ALT: 23 U/L (ref 14–54)
ANION GAP: 8 (ref 5–15)
AST: 22 U/L (ref 15–41)
BUN: 9 mg/dL (ref 6–20)
CALCIUM: 9.6 mg/dL (ref 8.9–10.3)
CO2: 26 mmol/L (ref 22–32)
Chloride: 101 mmol/L (ref 101–111)
Creatinine, Ser: 0.88 mg/dL (ref 0.44–1.00)
GFR calc non Af Amer: >60 mL/min (ref >60)
Glucose, Bld: 133 mg/dL — ABNORMAL HIGH (ref 65–99)
POTASSIUM: 4.6 mmol/L (ref 3.5–5.1)
SODIUM: 135 mmol/L (ref 135–145)
Total Bilirubin: 0.6 mg/dL (ref 0.3–1.2)
Total Protein: 7 g/dL (ref 6.5–8.1)

## 2015-12-30 LAB — LIPID PANEL
CHOLESTEROL: 110 mg/dL (ref 0–200)
HDL: 38 mg/dL — ABNORMAL LOW (ref >40)
LDL Cholesterol: 50 mg/dL (ref 0–99)
Total CHOL/HDL Ratio: 2.9 RATIO
Triglycerides: 108 mg/dL (ref <150)
VLDL: 22 mg/dL (ref 0–40)

## 2015-12-30 LAB — CBC
HCT: 39.3 % (ref 35.0–47.0)
Hemoglobin: 13.1 g/dL (ref 12.0–16.0)
MCH: 28.7 pg (ref 26.0–34.0)
MCHC: 33.3 g/dL (ref 32.0–36.0)
MCV: 86.3 fL (ref 80.0–100.0)
PLATELETS: 246 10*3/uL (ref 150–440)
RBC: 4.55 MIL/uL (ref 3.80–5.20)
RDW: 14.4 % (ref 11.5–14.5)
WBC: 10 10*3/uL (ref 3.6–11.0)

## 2015-12-30 LAB — TSH: TSH: 1.457 u[IU]/mL (ref 0.350–4.500)

## 2015-12-31 LAB — HEPATITIS C ANTIBODY: HCV Ab: <0.1 s/co ratio (ref 0.0–0.9)

## 2015-12-31 LAB — T4: T4, Total: 6.5 ug/dL (ref 4.5–12.0)

## 2016-01-06 ENCOUNTER — Ambulatory Visit: Payer: 59 | Admitting: Psychiatry

## 2016-01-13 ENCOUNTER — Ambulatory Visit (INDEPENDENT_AMBULATORY_CARE_PROVIDER_SITE_OTHER): Payer: 59 | Admitting: Psychiatry

## 2016-01-13 ENCOUNTER — Encounter: Payer: Self-pay | Admitting: Psychiatry

## 2016-01-13 VITALS — BP 142/92 | Temp 98.3°F | Wt 179.6 lb

## 2016-01-13 DIAGNOSIS — F410 Panic disorder [episodic paroxysmal anxiety] without agoraphobia: Secondary | ICD-10-CM | POA: Diagnosis not present

## 2016-01-13 DIAGNOSIS — F320 Major depressive disorder, single episode, mild: Secondary | ICD-10-CM | POA: Diagnosis not present

## 2016-01-13 MED ORDER — QUETIAPINE FUMARATE 50 MG PO TABS: 50.0000 mg | ORAL_TABLET | Freq: Two times a day (BID) | ORAL | 1 refills | Status: DC

## 2016-01-13 MED ORDER — CITALOPRAM HYDROBROMIDE 40 MG PO TABS: 40.0000 mg | ORAL_TABLET | Freq: Every day | ORAL | 0 refills | Status: DC

## 2016-01-13 NOTE — Progress Notes (Signed)
Patient ID: [name], female   DOB: 1945/09/29, 70 y.o.   MRN: [id number]  [facility] MD/PA/NP OP Progress Note  01/13/2016 10:38 AM Semra Wensel Bamber  MRN:  [id number]  Subjective:  Patient is a 70 year old female with major depressive disorder, panic disorder and insomnia. She reports being compliant with her medications. She is doing well on the combination of the Celexa and Seroquel and understands the side effects. States the depression is under control. She reports that she is preparing for Christmas and now has to wrap the gifts she bought for her grandchildren. She got sick recently from a bug and is recovering..Reports that she sleeps well and  eating well.    Chief Complaint:  Doing well Chief Complaint    Follow-up; Medication Refill     Visit Diagnosis:     ICD-9-CM ICD-10-CM   1. 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 Mild single current episode of major depressive disorder ([place]) 296.21 F32.0     Past Medical History:  Past Medical History:  Diagnosis Date  . Anxiety   . Depression   . Diabetes mellitus, type II ([place])   . GERD (gastroesophageal reflux disease)   . Thyroid disease     Past Surgical History:  Procedure Laterality Date  . ABDOMINAL HYSTERECTOMY    . BREAST BIOPSY     Family History:  Family History  Problem Relation Age of Onset  . Anxiety disorder Mother   . Colon cancer Mother   . Heart disease Father   . Anxiety disorder Father   . Obesity Sister   . Anxiety disorder Sister   . Depression Sister   . Osteoporosis Sister    Social History:  Social History   Social History  . Marital status: Widowed    Spouse name: N/A  . Number of children: N/A  . Years of education: N/A   Social History Main Topics  . Smoking status: Never Smoker  . Smokeless tobacco: Never Used  . Alcohol use No  . Drug use: No  . Sexual activity: No   Other Topics Concern  . None   Social History Narrative  . None   Additional History:    Assessment:   Musculoskeletal: Strength & Muscle Tone: within normal limits Gait & Station: normal Patient leans: N/A  Psychiatric Specialty Exam: Medication Refill   Anxiety  Patient reports no insomnia (Stable with treatment with Belsomra), nervous/anxious behavior or suicidal ideas.      Review of Systems  Psychiatric/Behavioral: Negative for depression, hallucinations, memory loss, substance abuse and suicidal ideas. The patient is not nervous/anxious and does not have insomnia (Stable with treatment with Belsomra).   All other systems reviewed and are negative.   Blood pressure (!) 142/92, temperature 98.3 F (36.8 C), temperature source Oral, weight 179 lb 9.6 oz (81.5 kg).Body mass index is 35.08 kg/m.  General Appearance: Neat and Well Groomed  Eye Contact:  Good  Speech:  Clear and Coherent and Normal Rate  Volume:  Normal  Mood:  Good  Affect:  Congruent, able to smile  Thought Process:  Linear and Logical  Orientation:  Full (Time, Place, and Person)  Thought Content:  Negative  Suicidal Thoughts:  No  Homicidal Thoughts:  No  Memory:  Immediate;   Good Recent;   Good Remote;   Good  Judgement:  Good  Insight:  Good  Psychomotor Activity:  Negative  Concentration:  Good  Recall:  Good  Fund of Knowledge: Good  Language: Good  Akathisia:  Negative  Handed:  Right unknown  AIMS (if indicated):  Not done  Assets:  Communication Skills Desire for Improvement Social Support  ADL's:  Intact  Cognition: WNL  Sleep:  Good    Is the patient at risk to self?  No. Has the patient been a risk to self in the past 6 months?  No. Has the patient been a risk to self within the distant past?  No. Is the patient a risk to others?  No. Has the patient been a risk to others in the past 6 months?  No. Has the patient been a risk to others within the distant past?  No.  Current Medications: Current Outpatient Prescriptions  Medication Sig Dispense Refill  .  aspirin 81 MG tablet Take 1 tablet by mouth daily.    . BELSOMRA 20 MG TABS Take 20 mg by mouth at bedtime as needed. 30 tablet 3  . BOOSTRIX 5-2.5-18.5 injection inject 0.5 milliliter intramuscularly  0  . citalopram (CELEXA) 40 MG tablet Take 1 tablet (40 mg total) by mouth daily. 90 tablet 0  . diltiazem (CARDIZEM CD) 240 MG 24 hr capsule Take 240 mg by mouth daily.  3  . FLUZONE HIGH-DOSE 0.5 ML SUSY inject 0.5 milliliter intramuscularly  0  . HYDROcodone-homatropine (HYCODAN) 5-1.5 MG/5ML syrup Take 5 mLs by mouth every 6 (six) hours as needed for cough. 120 mL 0  . levothyroxine (SYNTHROID, LEVOTHROID) 50 MCG tablet Take 50 mcg by mouth every morning.  5  . lisinopril (PRINIVIL,ZESTRIL) 20 MG tablet Take 20 mg by mouth daily.  5  . metFORMIN (GLUCOPHAGE) 500 MG tablet Take 500 mg by mouth daily.  3  . metoprolol succinate (TOPROL-XL) 25 MG 24 hr tablet Take by mouth.    . metoprolol tartrate (LOPRESSOR) 25 MG tablet TAKE 1 TWICE DAILY  5  . pantoprazole (PROTONIX) 40 MG tablet Take by mouth.    Marland Kitchen PREVNAR 13 SUSP injection inject 0.5 milliliter intramuscularly  0  . QUEtiapine (SEROQUEL) 50 MG tablet Take 1 tablet (50 mg total) by mouth 2 (two) times daily. 60 tablet 1  . simvastatin (ZOCOR) 40 MG tablet Take 40 mg by mouth at bedtime.  4  . sucralfate (CARAFATE) 1 G tablet Take by mouth.    . VESICARE 5 MG tablet Take 5 mg by mouth at bedtime.  0   No current facility-administered medications for this visit.     Medical Decision Making:  Established Problem, Stable/Improving (1)  Treatment Plan Summary:Medication management   Depressive disorder, recurrent, moderate patient's mood is stable. Continue Celexa 40 mg a day  Panic disorder without agoraphobia Continue Seroquel at 50mg  po bid. Discussed the interactions between seroquel and celexa With QT prolongation risk but patient and her daughter stated that she's been doing well and would like to continue on this  combination.  Insomnia  Belsomra 20 mg at bedtime as needed from her primary care physician.   She will follow up in 3 months. Encouraged call with questions or concerns prior to her next appointment.   Talia Hoheisel 01/13/2016, 10:38 AM

## 2016-01-19 ENCOUNTER — Emergency Department: Payer: Medicare Other

## 2016-01-19 ENCOUNTER — Encounter: Payer: Self-pay | Admitting: Emergency Medicine

## 2016-01-19 ENCOUNTER — Emergency Department
Admission: EM | Admit: 2016-01-19 | Discharge: 2016-01-19 | Disposition: A | Discharge status: Home / Self Care | Payer: Medicare Other | Attending: Emergency Medicine | Admitting: Emergency Medicine

## 2016-01-19 DIAGNOSIS — Z7982 Long term (current) use of aspirin: Secondary | ICD-10-CM | POA: Diagnosis not present

## 2016-01-19 DIAGNOSIS — J189 Pneumonia, unspecified organism: Secondary | ICD-10-CM | POA: Diagnosis not present

## 2016-01-19 DIAGNOSIS — E119 Type 2 diabetes mellitus without complications: Secondary | ICD-10-CM | POA: Insufficient documentation

## 2016-01-19 DIAGNOSIS — I10 Essential (primary) hypertension: Secondary | ICD-10-CM | POA: Diagnosis not present

## 2016-01-19 DIAGNOSIS — R05 Cough: Secondary | ICD-10-CM

## 2016-01-19 DIAGNOSIS — Z79899 Other long term (current) drug therapy: Secondary | ICD-10-CM | POA: Diagnosis not present

## 2016-01-19 DIAGNOSIS — R059 Cough, unspecified: Secondary | ICD-10-CM

## 2016-01-19 DIAGNOSIS — Z7984 Long term (current) use of oral hypoglycemic drugs: Secondary | ICD-10-CM | POA: Diagnosis not present

## 2016-01-19 LAB — BASIC METABOLIC PANEL
ANION GAP: 9 (ref 5–15)
BUN: 11 mg/dL (ref 6–20)
CO2: 22 mmol/L (ref 22–32)
Calcium: 9.4 mg/dL (ref 8.9–10.3)
Chloride: 101 mmol/L (ref 101–111)
Creatinine, Ser: 0.62 mg/dL (ref 0.44–1.00)
GFR calc Af Amer: >60 mL/min (ref >60)
Glucose, Bld: 141 mg/dL — ABNORMAL HIGH (ref 65–99)
POTASSIUM: 3.5 mmol/L (ref 3.5–5.1)
SODIUM: 132 mmol/L — AB (ref 135–145)

## 2016-01-19 LAB — CBC
HEMATOCRIT: 37 % (ref 35.0–47.0)
HEMOGLOBIN: 12.4 g/dL (ref 12.0–16.0)
MCH: 29.5 pg (ref 26.0–34.0)
MCHC: 33.5 g/dL (ref 32.0–36.0)
MCV: 88 fL (ref 80.0–100.0)
Platelets: 258 10*3/uL (ref 150–440)
RBC: 4.21 MIL/uL (ref 3.80–5.20)
RDW: 13.8 % (ref 11.5–14.5)
WBC: 12.7 10*3/uL — AB (ref 3.6–11.0)

## 2016-01-19 LAB — TROPONIN I

## 2016-01-19 MED ORDER — BENZONATATE 100 MG PO CAPS: 100.0000 mg | ORAL_CAPSULE | Freq: Four times a day (QID) | ORAL | 0 refills | Status: AC | PRN

## 2016-01-19 NOTE — ED Provider Notes (Signed)
[facility] Emergency Department Provider Note   ____________________________________________   I have reviewed the triage vital signs and the nursing notes.   HISTORY  Chief Complaint Cough   History limited by: Not Limited   HPI [name] is a 70 y.o. female who presents to the emergency department today because of concern for cough. Patient states that she was seen in an urgent care and diagnosed with pneumonia over the weekend. Patient states that she frequently gets pneumonias. She was started on doxycycline. Terms of her pneumonia she does think that she is getting somewhat better. Apparently one of her primary symptoms is headache and head pressure. This has gotten better since antibiotics. However she has continued to have cough. She states she has tried Mucinex without any relief.   Past Medical History:  Diagnosis Date  . Anxiety   . Depression   . Diabetes mellitus, type II ([place])   . GERD (gastroesophageal reflux disease)   . Thyroid disease     Patient Active Problem List   Diagnosis Date Noted  . Depression, major, recurrent, moderate ([place]) 07/22/2014  . Anxiety, generalized 07/22/2014  . Insomnia, persistent 07/22/2014  . Panic disorder without agoraphobia 07/22/2014  . H/O: obesity 06/01/2014  . H/O: HTN (hypertension) 06/01/2014  . H/O gastrointestinal disease 06/01/2014  . H/O diabetes mellitus 06/01/2014  . H/O elevated lipids 06/01/2014  . History of hay fever 06/01/2014    Past Surgical History:  Procedure Laterality Date  . ABDOMINAL HYSTERECTOMY    . BREAST BIOPSY      Prior to Admission medications   Medication Sig Start Date End Date Taking? Authorizing Provider  aspirin 81 MG tablet Take 1 tablet by mouth daily.    Historical Provider, MD  BELSOMRA 20 MG TABS Take 20 mg by mouth at bedtime as needed. 12/24/14   Marjie Skiff, MD  [place] 5-2.5-18.5 injection inject 0.5 milliliter intramuscularly 10/06/14    Historical Provider, MD  citalopram (CELEXA) 40 MG tablet Take 1 tablet (40 mg total) by mouth daily. 01/13/16   Himabindu Ravi, MD  diltiazem (CARDIZEM CD) 240 MG 24 hr capsule Take 240 mg by mouth daily. 06/30/14   Historical Provider, MD  FLUZONE HIGH-DOSE 0.5 ML SUSY inject 0.5 milliliter intramuscularly 10/06/14   Historical Provider, MD  HYDROcodone-homatropine (HYCODAN) 5-1.5 MG/5ML syrup Take 5 mLs by mouth every 6 (six) hours as needed for cough. 03/13/15   Mortimer Fries, PA-C  levothyroxine (SYNTHROID, LEVOTHROID) 50 MCG tablet Take 50 mcg by mouth every morning. 07/07/14   Historical Provider, MD  lisinopril (PRINIVIL,ZESTRIL) 20 MG tablet Take 20 mg by mouth daily. 06/28/14   Historical Provider, MD  metFORMIN (GLUCOPHAGE) 500 MG tablet Take 500 mg by mouth daily. 07/14/14   Historical Provider, MD  metoprolol succinate (TOPROL-XL) 25 MG 24 hr tablet Take by mouth.    Historical Provider, MD  metoprolol tartrate (LOPRESSOR) 25 MG tablet TAKE 1 TWICE DAILY 06/23/14   Historical Provider, MD  pantoprazole (PROTONIX) 40 MG tablet Take by mouth.    Historical Provider, MD  PREVNAR 13 SUSP injection inject 0.5 milliliter intramuscularly 05/22/15   Historical Provider, MD  QUEtiapine (SEROQUEL) 50 MG tablet Take 1 tablet (50 mg total) by mouth 2 (two) times daily. 01/13/16   Himabindu Ravi, MD  simvastatin (ZOCOR) 40 MG tablet Take 40 mg by mouth at bedtime. 07/05/14   Historical Provider, MD  sucralfate (CARAFATE) 1 G tablet Take by mouth.    Historical Provider, MD  VESICARE 5 MG tablet Take 5 mg by mouth at bedtime. 06/08/15   Historical Provider, MD    Allergies Patient has no known allergies.  Family History  Problem Relation Age of Onset  . Anxiety disorder Mother   . Colon cancer Mother   . Heart disease Father   . Anxiety disorder Father   . Obesity Sister   . Anxiety disorder Sister   . Depression Sister   . Osteoporosis Sister     Social History Social History  Substance Use  Topics  . Smoking status: Never Smoker  . Smokeless tobacco: Never Used  . Alcohol use No    Review of Systems  Constitutional: Negative for fever. Cardiovascular: Negative for chest pain. Respiratory: Positive for cough. Gastrointestinal: Negative for abdominal pain, vomiting and diarrhea. Genitourinary: Negative for dysuria. Musculoskeletal: Negative for back pain. Skin: Negative for rash. Neurological: Negative for headaches, focal weakness or numbness.  10-point ROS otherwise negative.  ____________________________________________   PHYSICAL EXAM:  VITAL SIGNS: ED Triage Vitals  Enc Vitals Group     BP 01/19/16 1723 (!) 157/74     Pulse Rate 01/19/16 1723 73     Resp 01/19/16 1723 (!) 24     Temp 01/19/16 1722 98.3 F (36.8 C)     Temp Source 01/19/16 1722 Oral     SpO2 01/19/16 1723 92 %     Weight 01/19/16 1721 179 lb (81.2 kg)     Height 01/19/16 1721 5' (1.524 m)     Head Circumference --      Peak Flow --      Pain Score 01/19/16 1948 0     Pain Loc --      Pain Edu? --      Excl. in [place]? --     Constitutional: Alert and oriented. Well appearing and in no distress. Eyes: Conjunctivae are normal. Normal extraocular movements. ENT   Head: Normocephalic and atraumatic.   Nose: No congestion/rhinnorhea.   Mouth/Throat: Mucous membranes are moist.   Neck: No stridor. Hematological/Lymphatic/Immunilogical: No cervical lymphadenopathy. Cardiovascular: Normal rate, regular rhythm.  No murmurs, rubs, or gallops.  Respiratory: Normal respiratory effort without tachypnea nor retractions. Breath sounds are clear and equal bilaterally. No wheezes/rales/rhonchi. Occasional dry sounding cough. Gastrointestinal: Soft and non tender. No rebound. No guarding.  Genitourinary: Deferred Musculoskeletal: Normal range of motion in all extremities. No lower extremity edema. Neurologic:  Normal speech and language. No gross focal neurologic deficits are  appreciated.  Skin:  Skin is warm, dry and intact. No rash noted. Psychiatric: Mood and affect are normal. Speech and behavior are normal. Patient exhibits appropriate insight and judgment.  ____________________________________________    LABS (pertinent positives/negatives)  Labs Reviewed  BASIC METABOLIC PANEL - Abnormal; Notable for the following:       Result Value   Sodium 132 (*)    Glucose, Bld 141 (*)    All other components within normal limits  CBC - Abnormal; Notable for the following:    WBC 12.7 (*)    All other components within normal limits  TROPONIN I     ____________________________________________   EKG  I, Nance Pear, attending physician, personally viewed and interpreted this EKG  EKG Time: 1733 Rate: 77 Rhythm: normal sinus rhythm Axis: left axis deviation Intervals: qtc 484 QRS: LBBB ST changes: no st elevation equivalent Impression: abnormal ekg   ____________________________________________    RADIOLOGY  CXR IMPRESSION:  1. Mild left infrahilar/lower lobe infiltrate  2. Moderate hiatal hernia  ____________________________________________   PROCEDURES  Procedures  ____________________________________________   INITIAL IMPRESSION / ASSESSMENT AND PLAN / ED COURSE  Pertinent labs & imaging results that were available during my care of the patient were reviewed by me and considered in my medical decision making (see chart for details).   Patient presented to the emergency department today because of concerns for cough in the setting of recently diagnosed pneumonia. She is on doxycycline for the pneumonia which I thinks appropriate treatment. X-ray here does show pneumonia however I think it is too early to see resolution on the x-ray. Will plan on giving patient prescription for Tessalon Perles. Instructed patient follow-up with primary care if symptoms continue or get  worse. ____________________________________________   FINAL CLINICAL IMPRESSION(S) / ED DIAGNOSES  Final diagnoses:  Cough  Community acquired pneumonia, unspecified laterality     Note: This dictation was prepared with Sales executive. Any transcriptional errors that result from this process are unintentional     Nance Pear, MD 01/19/16 2025

## 2016-01-19 NOTE — ED Triage Notes (Addendum)
Pt has had cough for 1 week. [facility] and reports was dx with PNA but no xray was done; reports provider heard something so treated her. Has been on doxycycline. Denies SHOB except when having coughing fit. No respiratory distress in triage. Denies fevers. C/o chest pain but thinks is from coughing

## 2016-01-19 NOTE — Discharge Instructions (Signed)
Please seek medical attention for any high fevers, chest pain, shortness of breath, change in behavior, persistent vomiting, bloody stool or any other new or concerning symptoms.  

## 2016-03-04 ENCOUNTER — Other Ambulatory Visit: Payer: Self-pay | Admitting: Orthopedic Surgery

## 2016-03-04 DIAGNOSIS — M2391 Unspecified internal derangement of right knee: Secondary | ICD-10-CM

## 2016-03-04 DIAGNOSIS — M1711 Unilateral primary osteoarthritis, right knee: Secondary | ICD-10-CM

## 2016-03-30 ENCOUNTER — Ambulatory Visit
Admission: RE | Admit: 2016-03-30 | Discharge: 2016-03-30 | Disposition: A | Discharge status: Home / Self Care | Payer: Medicare Other | Source: Ambulatory Visit | Attending: Orthopedic Surgery | Admitting: Orthopedic Surgery

## 2016-03-30 DIAGNOSIS — M23241 Derangement of anterior horn of lateral meniscus due to old tear or injury, right knee: Secondary | ICD-10-CM | POA: Diagnosis not present

## 2016-03-30 DIAGNOSIS — M23221 Derangement of posterior horn of medial meniscus due to old tear or injury, right knee: Secondary | ICD-10-CM | POA: Diagnosis not present

## 2016-03-30 DIAGNOSIS — M1711 Unilateral primary osteoarthritis, right knee: Secondary | ICD-10-CM | POA: Insufficient documentation

## 2016-03-30 DIAGNOSIS — M2391 Unspecified internal derangement of right knee: Secondary | ICD-10-CM | POA: Diagnosis present

## 2016-04-19 ENCOUNTER — Other Ambulatory Visit: Payer: Self-pay | Admitting: Nurse Practitioner

## 2016-04-19 DIAGNOSIS — R109 Unspecified abdominal pain: Secondary | ICD-10-CM

## 2016-04-25 ENCOUNTER — Ambulatory Visit
Admission: RE | Admit: 2016-04-25 | Discharge: 2016-04-25 | Disposition: A | Discharge status: Home / Self Care | Payer: Medicare Other | Source: Ambulatory Visit | Attending: Nurse Practitioner | Admitting: Nurse Practitioner

## 2016-04-25 DIAGNOSIS — R109 Unspecified abdominal pain: Secondary | ICD-10-CM

## 2016-04-25 DIAGNOSIS — K449 Diaphragmatic hernia without obstruction or gangrene: Secondary | ICD-10-CM | POA: Diagnosis not present

## 2016-04-25 DIAGNOSIS — K5730 Diverticulosis of large intestine without perforation or abscess without bleeding: Secondary | ICD-10-CM | POA: Insufficient documentation

## 2016-04-26 ENCOUNTER — Ambulatory Visit: Payer: Medicare Other

## 2016-05-04 ENCOUNTER — Other Ambulatory Visit: Payer: Self-pay | Admitting: Nurse Practitioner

## 2016-05-04 ENCOUNTER — Ambulatory Visit
Admission: RE | Admit: 2016-05-04 | Discharge: 2016-05-04 | Disposition: A | Discharge status: Home / Self Care | Payer: Medicare Other | Source: Ambulatory Visit | Attending: Nurse Practitioner | Admitting: Nurse Practitioner

## 2016-05-04 DIAGNOSIS — R059 Cough, unspecified: Secondary | ICD-10-CM

## 2016-05-04 DIAGNOSIS — Z01810 Encounter for preprocedural cardiovascular examination: Secondary | ICD-10-CM | POA: Insufficient documentation

## 2016-05-04 DIAGNOSIS — R05 Cough: Secondary | ICD-10-CM

## 2016-05-04 DIAGNOSIS — M438X4 Other specified deforming dorsopathies, thoracic region: Secondary | ICD-10-CM | POA: Insufficient documentation

## 2016-05-04 DIAGNOSIS — Z8701 Personal history of pneumonia (recurrent): Secondary | ICD-10-CM | POA: Diagnosis not present

## 2016-05-04 DIAGNOSIS — K449 Diaphragmatic hernia without obstruction or gangrene: Secondary | ICD-10-CM | POA: Diagnosis not present

## 2016-07-28 DIAGNOSIS — M1711 Unilateral primary osteoarthritis, right knee: Secondary | ICD-10-CM | POA: Insufficient documentation

## 2016-08-08 DIAGNOSIS — M1711 Unilateral primary osteoarthritis, right knee: Secondary | ICD-10-CM | POA: Diagnosis not present

## 2016-08-08 DIAGNOSIS — Z01818 Encounter for other preprocedural examination: Secondary | ICD-10-CM | POA: Diagnosis not present

## 2016-08-08 DIAGNOSIS — Z01812 Encounter for preprocedural laboratory examination: Secondary | ICD-10-CM | POA: Diagnosis not present

## 2016-08-15 DIAGNOSIS — K449 Diaphragmatic hernia without obstruction or gangrene: Secondary | ICD-10-CM | POA: Diagnosis not present

## 2016-08-15 DIAGNOSIS — Z79899 Other long term (current) drug therapy: Secondary | ICD-10-CM | POA: Diagnosis not present

## 2016-08-15 DIAGNOSIS — N3281 Overactive bladder: Secondary | ICD-10-CM | POA: Diagnosis not present

## 2016-08-15 DIAGNOSIS — E039 Hypothyroidism, unspecified: Secondary | ICD-10-CM | POA: Diagnosis not present

## 2016-08-15 DIAGNOSIS — M1711 Unilateral primary osteoarthritis, right knee: Secondary | ICD-10-CM | POA: Diagnosis not present

## 2016-08-15 DIAGNOSIS — R0902 Hypoxemia: Secondary | ICD-10-CM | POA: Diagnosis not present

## 2016-08-15 DIAGNOSIS — R2689 Other abnormalities of gait and mobility: Secondary | ICD-10-CM | POA: Diagnosis not present

## 2016-08-15 DIAGNOSIS — R5082 Postprocedural fever: Secondary | ICD-10-CM | POA: Diagnosis not present

## 2016-08-15 DIAGNOSIS — I10 Essential (primary) hypertension: Secondary | ICD-10-CM | POA: Diagnosis not present

## 2016-08-15 DIAGNOSIS — M25561 Pain in right knee: Secondary | ICD-10-CM | POA: Diagnosis not present

## 2016-08-15 DIAGNOSIS — R262 Difficulty in walking, not elsewhere classified: Secondary | ICD-10-CM | POA: Diagnosis not present

## 2016-08-15 DIAGNOSIS — K219 Gastro-esophageal reflux disease without esophagitis: Secondary | ICD-10-CM | POA: Diagnosis not present

## 2016-08-15 DIAGNOSIS — R339 Retention of urine, unspecified: Secondary | ICD-10-CM | POA: Diagnosis not present

## 2016-08-15 DIAGNOSIS — E119 Type 2 diabetes mellitus without complications: Secondary | ICD-10-CM | POA: Diagnosis not present

## 2016-08-15 DIAGNOSIS — D72829 Elevated white blood cell count, unspecified: Secondary | ICD-10-CM | POA: Diagnosis not present

## 2016-08-15 DIAGNOSIS — G4733 Obstructive sleep apnea (adult) (pediatric): Secondary | ICD-10-CM | POA: Diagnosis not present

## 2016-08-15 DIAGNOSIS — G4709 Other insomnia: Secondary | ICD-10-CM | POA: Diagnosis not present

## 2016-08-15 DIAGNOSIS — Z9119 Patient's noncompliance with other medical treatment and regimen: Secondary | ICD-10-CM | POA: Diagnosis not present

## 2016-08-15 DIAGNOSIS — E785 Hyperlipidemia, unspecified: Secondary | ICD-10-CM | POA: Diagnosis not present

## 2016-08-15 DIAGNOSIS — Z7984 Long term (current) use of oral hypoglycemic drugs: Secondary | ICD-10-CM | POA: Diagnosis not present

## 2016-08-15 DIAGNOSIS — R05 Cough: Secondary | ICD-10-CM | POA: Diagnosis not present

## 2016-08-15 DIAGNOSIS — G8918 Other acute postprocedural pain: Secondary | ICD-10-CM | POA: Diagnosis not present

## 2016-08-15 DIAGNOSIS — D649 Anemia, unspecified: Secondary | ICD-10-CM | POA: Diagnosis not present

## 2016-08-15 DIAGNOSIS — R410 Disorientation, unspecified: Secondary | ICD-10-CM | POA: Diagnosis not present

## 2016-08-18 DIAGNOSIS — I10 Essential (primary) hypertension: Secondary | ICD-10-CM | POA: Diagnosis not present

## 2016-08-18 DIAGNOSIS — Z7984 Long term (current) use of oral hypoglycemic drugs: Secondary | ICD-10-CM | POA: Diagnosis not present

## 2016-08-18 DIAGNOSIS — E119 Type 2 diabetes mellitus without complications: Secondary | ICD-10-CM | POA: Diagnosis not present

## 2016-08-18 DIAGNOSIS — Z7901 Long term (current) use of anticoagulants: Secondary | ICD-10-CM | POA: Diagnosis not present

## 2016-08-18 DIAGNOSIS — G4730 Sleep apnea, unspecified: Secondary | ICD-10-CM | POA: Diagnosis not present

## 2016-08-18 DIAGNOSIS — M1990 Unspecified osteoarthritis, unspecified site: Secondary | ICD-10-CM | POA: Diagnosis not present

## 2016-08-18 DIAGNOSIS — Z471 Aftercare following joint replacement surgery: Secondary | ICD-10-CM | POA: Diagnosis not present

## 2016-08-18 DIAGNOSIS — E079 Disorder of thyroid, unspecified: Secondary | ICD-10-CM | POA: Diagnosis not present

## 2016-08-18 DIAGNOSIS — Z96651 Presence of right artificial knee joint: Secondary | ICD-10-CM | POA: Diagnosis not present

## 2016-08-18 DIAGNOSIS — Z9181 History of falling: Secondary | ICD-10-CM | POA: Diagnosis not present

## 2016-08-19 DIAGNOSIS — Z96651 Presence of right artificial knee joint: Secondary | ICD-10-CM | POA: Insufficient documentation

## 2016-08-22 DIAGNOSIS — Z471 Aftercare following joint replacement surgery: Secondary | ICD-10-CM | POA: Diagnosis not present

## 2016-08-22 DIAGNOSIS — Z7984 Long term (current) use of oral hypoglycemic drugs: Secondary | ICD-10-CM | POA: Diagnosis not present

## 2016-08-22 DIAGNOSIS — I10 Essential (primary) hypertension: Secondary | ICD-10-CM | POA: Diagnosis not present

## 2016-08-22 DIAGNOSIS — Z96651 Presence of right artificial knee joint: Secondary | ICD-10-CM | POA: Diagnosis not present

## 2016-08-22 DIAGNOSIS — E079 Disorder of thyroid, unspecified: Secondary | ICD-10-CM | POA: Diagnosis not present

## 2016-08-22 DIAGNOSIS — G4730 Sleep apnea, unspecified: Secondary | ICD-10-CM | POA: Diagnosis not present

## 2016-08-22 DIAGNOSIS — Z9181 History of falling: Secondary | ICD-10-CM | POA: Diagnosis not present

## 2016-08-22 DIAGNOSIS — E119 Type 2 diabetes mellitus without complications: Secondary | ICD-10-CM | POA: Diagnosis not present

## 2016-08-22 DIAGNOSIS — Z7901 Long term (current) use of anticoagulants: Secondary | ICD-10-CM | POA: Diagnosis not present

## 2016-08-22 DIAGNOSIS — M1990 Unspecified osteoarthritis, unspecified site: Secondary | ICD-10-CM | POA: Diagnosis not present

## 2016-08-24 DIAGNOSIS — Z96651 Presence of right artificial knee joint: Secondary | ICD-10-CM | POA: Diagnosis not present

## 2016-08-24 DIAGNOSIS — Z9181 History of falling: Secondary | ICD-10-CM | POA: Diagnosis not present

## 2016-08-24 DIAGNOSIS — Z7901 Long term (current) use of anticoagulants: Secondary | ICD-10-CM | POA: Diagnosis not present

## 2016-08-24 DIAGNOSIS — G4730 Sleep apnea, unspecified: Secondary | ICD-10-CM | POA: Diagnosis not present

## 2016-08-24 DIAGNOSIS — Z7984 Long term (current) use of oral hypoglycemic drugs: Secondary | ICD-10-CM | POA: Diagnosis not present

## 2016-08-24 DIAGNOSIS — I10 Essential (primary) hypertension: Secondary | ICD-10-CM | POA: Diagnosis not present

## 2016-08-24 DIAGNOSIS — M1990 Unspecified osteoarthritis, unspecified site: Secondary | ICD-10-CM | POA: Diagnosis not present

## 2016-08-24 DIAGNOSIS — Z471 Aftercare following joint replacement surgery: Secondary | ICD-10-CM | POA: Diagnosis not present

## 2016-08-24 DIAGNOSIS — E119 Type 2 diabetes mellitus without complications: Secondary | ICD-10-CM | POA: Diagnosis not present

## 2016-08-24 DIAGNOSIS — E079 Disorder of thyroid, unspecified: Secondary | ICD-10-CM | POA: Diagnosis not present

## 2016-08-29 DIAGNOSIS — G4730 Sleep apnea, unspecified: Secondary | ICD-10-CM | POA: Diagnosis not present

## 2016-08-29 DIAGNOSIS — Z9181 History of falling: Secondary | ICD-10-CM | POA: Diagnosis not present

## 2016-08-29 DIAGNOSIS — Z7984 Long term (current) use of oral hypoglycemic drugs: Secondary | ICD-10-CM | POA: Diagnosis not present

## 2016-08-29 DIAGNOSIS — Z471 Aftercare following joint replacement surgery: Secondary | ICD-10-CM | POA: Diagnosis not present

## 2016-08-29 DIAGNOSIS — Z7901 Long term (current) use of anticoagulants: Secondary | ICD-10-CM | POA: Diagnosis not present

## 2016-08-29 DIAGNOSIS — Z96651 Presence of right artificial knee joint: Secondary | ICD-10-CM | POA: Diagnosis not present

## 2016-08-29 DIAGNOSIS — E079 Disorder of thyroid, unspecified: Secondary | ICD-10-CM | POA: Diagnosis not present

## 2016-08-29 DIAGNOSIS — I10 Essential (primary) hypertension: Secondary | ICD-10-CM | POA: Diagnosis not present

## 2016-08-29 DIAGNOSIS — M1990 Unspecified osteoarthritis, unspecified site: Secondary | ICD-10-CM | POA: Diagnosis not present

## 2016-08-29 DIAGNOSIS — E119 Type 2 diabetes mellitus without complications: Secondary | ICD-10-CM | POA: Diagnosis not present

## 2016-08-31 DIAGNOSIS — Z7901 Long term (current) use of anticoagulants: Secondary | ICD-10-CM | POA: Diagnosis not present

## 2016-08-31 DIAGNOSIS — Z471 Aftercare following joint replacement surgery: Secondary | ICD-10-CM | POA: Diagnosis not present

## 2016-08-31 DIAGNOSIS — Z7984 Long term (current) use of oral hypoglycemic drugs: Secondary | ICD-10-CM | POA: Diagnosis not present

## 2016-08-31 DIAGNOSIS — E119 Type 2 diabetes mellitus without complications: Secondary | ICD-10-CM | POA: Diagnosis not present

## 2016-08-31 DIAGNOSIS — E079 Disorder of thyroid, unspecified: Secondary | ICD-10-CM | POA: Diagnosis not present

## 2016-08-31 DIAGNOSIS — M1990 Unspecified osteoarthritis, unspecified site: Secondary | ICD-10-CM | POA: Diagnosis not present

## 2016-08-31 DIAGNOSIS — I10 Essential (primary) hypertension: Secondary | ICD-10-CM | POA: Diagnosis not present

## 2016-08-31 DIAGNOSIS — Z9181 History of falling: Secondary | ICD-10-CM | POA: Diagnosis not present

## 2016-08-31 DIAGNOSIS — Z96651 Presence of right artificial knee joint: Secondary | ICD-10-CM | POA: Diagnosis not present

## 2016-08-31 DIAGNOSIS — G4730 Sleep apnea, unspecified: Secondary | ICD-10-CM | POA: Diagnosis not present

## 2016-09-02 DIAGNOSIS — Z9181 History of falling: Secondary | ICD-10-CM | POA: Diagnosis not present

## 2016-09-02 DIAGNOSIS — M1990 Unspecified osteoarthritis, unspecified site: Secondary | ICD-10-CM | POA: Diagnosis not present

## 2016-09-02 DIAGNOSIS — E079 Disorder of thyroid, unspecified: Secondary | ICD-10-CM | POA: Diagnosis not present

## 2016-09-02 DIAGNOSIS — Z7984 Long term (current) use of oral hypoglycemic drugs: Secondary | ICD-10-CM | POA: Diagnosis not present

## 2016-09-02 DIAGNOSIS — Z471 Aftercare following joint replacement surgery: Secondary | ICD-10-CM | POA: Diagnosis not present

## 2016-09-02 DIAGNOSIS — Z96651 Presence of right artificial knee joint: Secondary | ICD-10-CM | POA: Diagnosis not present

## 2016-09-02 DIAGNOSIS — I10 Essential (primary) hypertension: Secondary | ICD-10-CM | POA: Diagnosis not present

## 2016-09-02 DIAGNOSIS — E119 Type 2 diabetes mellitus without complications: Secondary | ICD-10-CM | POA: Diagnosis not present

## 2016-09-02 DIAGNOSIS — G4730 Sleep apnea, unspecified: Secondary | ICD-10-CM | POA: Diagnosis not present

## 2016-09-02 DIAGNOSIS — Z7901 Long term (current) use of anticoagulants: Secondary | ICD-10-CM | POA: Diagnosis not present

## 2016-09-05 DIAGNOSIS — Z471 Aftercare following joint replacement surgery: Secondary | ICD-10-CM | POA: Diagnosis not present

## 2016-09-05 DIAGNOSIS — Z7901 Long term (current) use of anticoagulants: Secondary | ICD-10-CM | POA: Diagnosis not present

## 2016-09-05 DIAGNOSIS — Z7984 Long term (current) use of oral hypoglycemic drugs: Secondary | ICD-10-CM | POA: Diagnosis not present

## 2016-09-05 DIAGNOSIS — I10 Essential (primary) hypertension: Secondary | ICD-10-CM | POA: Diagnosis not present

## 2016-09-05 DIAGNOSIS — Z96651 Presence of right artificial knee joint: Secondary | ICD-10-CM | POA: Diagnosis not present

## 2016-09-05 DIAGNOSIS — G4730 Sleep apnea, unspecified: Secondary | ICD-10-CM | POA: Diagnosis not present

## 2016-09-05 DIAGNOSIS — E119 Type 2 diabetes mellitus without complications: Secondary | ICD-10-CM | POA: Diagnosis not present

## 2016-09-05 DIAGNOSIS — Z9181 History of falling: Secondary | ICD-10-CM | POA: Diagnosis not present

## 2016-09-05 DIAGNOSIS — M1990 Unspecified osteoarthritis, unspecified site: Secondary | ICD-10-CM | POA: Diagnosis not present

## 2016-09-05 DIAGNOSIS — E079 Disorder of thyroid, unspecified: Secondary | ICD-10-CM | POA: Diagnosis not present

## 2016-09-07 DIAGNOSIS — Z7984 Long term (current) use of oral hypoglycemic drugs: Secondary | ICD-10-CM | POA: Diagnosis not present

## 2016-09-07 DIAGNOSIS — Z9181 History of falling: Secondary | ICD-10-CM | POA: Diagnosis not present

## 2016-09-07 DIAGNOSIS — E119 Type 2 diabetes mellitus without complications: Secondary | ICD-10-CM | POA: Diagnosis not present

## 2016-09-07 DIAGNOSIS — Z96651 Presence of right artificial knee joint: Secondary | ICD-10-CM | POA: Diagnosis not present

## 2016-09-07 DIAGNOSIS — I10 Essential (primary) hypertension: Secondary | ICD-10-CM | POA: Diagnosis not present

## 2016-09-07 DIAGNOSIS — Z471 Aftercare following joint replacement surgery: Secondary | ICD-10-CM | POA: Diagnosis not present

## 2016-09-07 DIAGNOSIS — Z7901 Long term (current) use of anticoagulants: Secondary | ICD-10-CM | POA: Diagnosis not present

## 2016-09-07 DIAGNOSIS — E079 Disorder of thyroid, unspecified: Secondary | ICD-10-CM | POA: Diagnosis not present

## 2016-09-07 DIAGNOSIS — G4730 Sleep apnea, unspecified: Secondary | ICD-10-CM | POA: Diagnosis not present

## 2016-09-07 DIAGNOSIS — M1990 Unspecified osteoarthritis, unspecified site: Secondary | ICD-10-CM | POA: Diagnosis not present

## 2016-09-12 ENCOUNTER — Emergency Department: Payer: Medicare Other

## 2016-09-12 ENCOUNTER — Inpatient Hospital Stay
Admission: EM | Admit: 2016-09-12 | Discharge: 2016-09-14 | DRG: 394 | Disposition: A | Discharge status: Home / Self Care | Payer: Medicare Other | Attending: Internal Medicine | Admitting: Internal Medicine

## 2016-09-12 DIAGNOSIS — Z471 Aftercare following joint replacement surgery: Secondary | ICD-10-CM | POA: Diagnosis not present

## 2016-09-12 DIAGNOSIS — R1110 Vomiting, unspecified: Secondary | ICD-10-CM | POA: Diagnosis not present

## 2016-09-12 DIAGNOSIS — G4730 Sleep apnea, unspecified: Secondary | ICD-10-CM | POA: Diagnosis not present

## 2016-09-12 DIAGNOSIS — R109 Unspecified abdominal pain: Secondary | ICD-10-CM | POA: Diagnosis not present

## 2016-09-12 DIAGNOSIS — F419 Anxiety disorder, unspecified: Secondary | ICD-10-CM | POA: Diagnosis present

## 2016-09-12 DIAGNOSIS — R339 Retention of urine, unspecified: Secondary | ICD-10-CM

## 2016-09-12 DIAGNOSIS — R338 Other retention of urine: Secondary | ICD-10-CM

## 2016-09-12 DIAGNOSIS — F329 Major depressive disorder, single episode, unspecified: Secondary | ICD-10-CM | POA: Diagnosis present

## 2016-09-12 DIAGNOSIS — Z8249 Family history of ischemic heart disease and other diseases of the circulatory system: Secondary | ICD-10-CM

## 2016-09-12 DIAGNOSIS — Z7984 Long term (current) use of oral hypoglycemic drugs: Secondary | ICD-10-CM

## 2016-09-12 DIAGNOSIS — R112 Nausea with vomiting, unspecified: Secondary | ICD-10-CM

## 2016-09-12 DIAGNOSIS — Z79899 Other long term (current) drug therapy: Secondary | ICD-10-CM | POA: Diagnosis not present

## 2016-09-12 DIAGNOSIS — E039 Hypothyroidism, unspecified: Secondary | ICD-10-CM | POA: Diagnosis present

## 2016-09-12 DIAGNOSIS — Z90710 Acquired absence of both cervix and uterus: Secondary | ICD-10-CM | POA: Diagnosis not present

## 2016-09-12 DIAGNOSIS — J301 Allergic rhinitis due to pollen: Secondary | ICD-10-CM | POA: Diagnosis not present

## 2016-09-12 DIAGNOSIS — E872 Acidosis: Secondary | ICD-10-CM | POA: Diagnosis not present

## 2016-09-12 DIAGNOSIS — G4709 Other insomnia: Secondary | ICD-10-CM | POA: Diagnosis not present

## 2016-09-12 DIAGNOSIS — E079 Disorder of thyroid, unspecified: Secondary | ICD-10-CM | POA: Diagnosis not present

## 2016-09-12 DIAGNOSIS — Z818 Family history of other mental and behavioral disorders: Secondary | ICD-10-CM | POA: Diagnosis not present

## 2016-09-12 DIAGNOSIS — E119 Type 2 diabetes mellitus without complications: Secondary | ICD-10-CM | POA: Diagnosis present

## 2016-09-12 DIAGNOSIS — R197 Diarrhea, unspecified: Secondary | ICD-10-CM

## 2016-09-12 DIAGNOSIS — K55039 Acute (reversible) ischemia of large intestine, extent unspecified: Principal | ICD-10-CM | POA: Diagnosis present

## 2016-09-12 DIAGNOSIS — R1084 Generalized abdominal pain: Secondary | ICD-10-CM | POA: Diagnosis not present

## 2016-09-12 DIAGNOSIS — K529 Noninfective gastroenteritis and colitis, unspecified: Secondary | ICD-10-CM | POA: Diagnosis not present

## 2016-09-12 DIAGNOSIS — Z96651 Presence of right artificial knee joint: Secondary | ICD-10-CM | POA: Diagnosis not present

## 2016-09-12 DIAGNOSIS — R000 Tachycardia, unspecified: Secondary | ICD-10-CM | POA: Diagnosis not present

## 2016-09-12 DIAGNOSIS — K219 Gastro-esophageal reflux disease without esophagitis: Secondary | ICD-10-CM | POA: Diagnosis not present

## 2016-09-12 DIAGNOSIS — I10 Essential (primary) hypertension: Secondary | ICD-10-CM | POA: Diagnosis not present

## 2016-09-12 DIAGNOSIS — R110 Nausea: Secondary | ICD-10-CM

## 2016-09-12 DIAGNOSIS — Z7901 Long term (current) use of anticoagulants: Secondary | ICD-10-CM | POA: Diagnosis not present

## 2016-09-12 DIAGNOSIS — I959 Hypotension, unspecified: Secondary | ICD-10-CM | POA: Diagnosis present

## 2016-09-12 DIAGNOSIS — N179 Acute kidney failure, unspecified: Secondary | ICD-10-CM | POA: Diagnosis present

## 2016-09-12 DIAGNOSIS — Z9181 History of falling: Secondary | ICD-10-CM | POA: Diagnosis not present

## 2016-09-12 DIAGNOSIS — N289 Disorder of kidney and ureter, unspecified: Secondary | ICD-10-CM

## 2016-09-12 DIAGNOSIS — Z7982 Long term (current) use of aspirin: Secondary | ICD-10-CM

## 2016-09-12 DIAGNOSIS — M1990 Unspecified osteoarthritis, unspecified site: Secondary | ICD-10-CM | POA: Diagnosis not present

## 2016-09-12 HISTORY — DX: Essential (primary) hypertension: I10

## 2016-09-12 LAB — COMPREHENSIVE METABOLIC PANEL
ALT: 18 U/L (ref 14–54)
AST: 43 U/L — ABNORMAL HIGH (ref 15–41)
Albumin: 4 g/dL (ref 3.5–5.0)
Alkaline Phosphatase: 57 U/L (ref 38–126)
Anion gap: 14 (ref 5–15)
BUN: 11 mg/dL (ref 6–20)
CHLORIDE: 103 mmol/L (ref 101–111)
CO2: 19 mmol/L — AB (ref 22–32)
Calcium: 9.9 mg/dL (ref 8.9–10.3)
Creatinine, Ser: 1.34 mg/dL — ABNORMAL HIGH (ref 0.44–1.00)
GFR, EST AFRICAN AMERICAN: 45 mL/min — AB (ref >60)
GFR, EST NON AFRICAN AMERICAN: 39 mL/min — AB (ref >60)
Glucose, Bld: 260 mg/dL — ABNORMAL HIGH (ref 65–99)
POTASSIUM: 4.9 mmol/L (ref 3.5–5.1)
SODIUM: 136 mmol/L (ref 135–145)
Total Bilirubin: 0.8 mg/dL (ref 0.3–1.2)
Total Protein: 6.5 g/dL (ref 6.5–8.1)

## 2016-09-12 LAB — URINALYSIS, COMPLETE (UACMP) WITH MICROSCOPIC
Bacteria, UA: NONE SEEN
Bilirubin Urine: NEGATIVE
GLUCOSE, UA: NEGATIVE mg/dL
Hgb urine dipstick: NEGATIVE
Ketones, ur: NEGATIVE mg/dL
Leukocytes, UA: NEGATIVE
NITRITE: NEGATIVE
PH: 5 (ref 5.0–8.0)
Protein, ur: NEGATIVE mg/dL
Specific Gravity, Urine: 1.008 (ref 1.005–1.030)
Squamous Epithelial / LPF: NONE SEEN

## 2016-09-12 LAB — CBC WITH DIFFERENTIAL/PLATELET
BASOS ABS: 0.1 10*3/uL (ref 0–0.1)
Basophils Relative: 0 %
EOS PCT: 0 %
Eosinophils Absolute: 0 10*3/uL (ref 0–0.7)
HCT: 39.1 % (ref 35.0–47.0)
Hemoglobin: 13 g/dL (ref 12.0–16.0)
LYMPHS ABS: 1.5 10*3/uL (ref 1.0–3.6)
LYMPHS PCT: 7 %
MCH: 29.5 pg (ref 26.0–34.0)
MCHC: 33.1 g/dL (ref 32.0–36.0)
MCV: 89.1 fL (ref 80.0–100.0)
MONO ABS: 1.2 10*3/uL — AB (ref 0.2–0.9)
Monocytes Relative: 6 %
NEUTROS ABS: 18.6 10*3/uL — AB (ref 1.4–6.5)
Neutrophils Relative %: 87 %
PLATELETS: 296 10*3/uL (ref 150–440)
RBC: 4.4 MIL/uL (ref 3.80–5.20)
RDW: 14.1 % (ref 11.5–14.5)
WBC: 21.4 10*3/uL — ABNORMAL HIGH (ref 3.6–11.0)

## 2016-09-12 LAB — LIPASE, BLOOD: LIPASE: 27 U/L (ref 11–51)

## 2016-09-12 LAB — LACTIC ACID, PLASMA
LACTIC ACID, VENOUS: 5.8 mmol/L — AB (ref 0.5–1.9)
LACTIC ACID, VENOUS: 4.4 mmol/L — AB (ref 0.5–1.9)

## 2016-09-12 MED ORDER — SODIUM CHLORIDE 0.9 % IV BOLUS (SEPSIS)
1000.0000 mL | Freq: Once | INTRAVENOUS | Status: AC
  Administered 2016-09-12: 1000 mL via INTRAVENOUS

## 2016-09-12 MED ORDER — ONDANSETRON HCL 4 MG/2ML IJ SOLN
4.0000 mg | Freq: Four times a day (QID) | INTRAMUSCULAR | Status: DC | PRN
  Administered 2016-09-13: 4 mg via INTRAVENOUS
  Filled 2016-09-12: qty 2

## 2016-09-12 MED ORDER — SIMVASTATIN 40 MG PO TABS
40.0000 mg | ORAL_TABLET | Freq: Every day | ORAL | Status: DC
  Administered 2016-09-13 (×2): 40 mg via ORAL
  Filled 2016-09-12 (×3): qty 1

## 2016-09-12 MED ORDER — IOPAMIDOL (ISOVUE-300) INJECTION 61%
30.0000 mL | Freq: Once | INTRAVENOUS | Status: AC | PRN
  Administered 2016-09-12: 30 mL via ORAL

## 2016-09-12 MED ORDER — DARIFENACIN HYDROBROMIDE ER 7.5 MG PO TB24
7.5000 mg | ORAL_TABLET | Freq: Every day | ORAL | Status: DC
  Filled 2016-09-12: qty 1

## 2016-09-12 MED ORDER — CITALOPRAM HYDROBROMIDE 20 MG PO TABS
40.0000 mg | ORAL_TABLET | Freq: Every day | ORAL | Status: DC
  Administered 2016-09-13 – 2016-09-14 (×2): 40 mg via ORAL
  Filled 2016-09-12 (×2): qty 2

## 2016-09-12 MED ORDER — QUETIAPINE FUMARATE 25 MG PO TABS
50.0000 mg | ORAL_TABLET | Freq: Two times a day (BID) | ORAL | Status: DC
  Administered 2016-09-13 – 2016-09-14 (×4): 50 mg via ORAL
  Filled 2016-09-12 (×4): qty 2

## 2016-09-12 MED ORDER — METRONIDAZOLE IN NACL 5-0.79 MG/ML-% IV SOLN
500.0000 mg | Freq: Three times a day (TID) | INTRAVENOUS | Status: DC
  Administered 2016-09-12 – 2016-09-14 (×5): 500 mg via INTRAVENOUS
  Filled 2016-09-12 (×6): qty 100

## 2016-09-12 MED ORDER — SODIUM CHLORIDE 0.9 % IV SOLN
INTRAVENOUS | Status: DC
  Administered 2016-09-13 (×2): via INTRAVENOUS

## 2016-09-12 MED ORDER — IOPAMIDOL (ISOVUE-300) INJECTION 61%
75.0000 mL | Freq: Once | INTRAVENOUS | Status: AC | PRN
  Administered 2016-09-12: 75 mL via INTRAVENOUS

## 2016-09-12 MED ORDER — ACETAMINOPHEN 650 MG RE SUPP: 650.0000 mg | Freq: Four times a day (QID) | RECTAL | Status: DC | PRN

## 2016-09-12 MED ORDER — OXYCODONE HCL 5 MG PO TABS
5.0000 mg | ORAL_TABLET | ORAL | Status: DC | PRN
  Administered 2016-09-13 (×2): 5 mg via ORAL
  Filled 2016-09-12 (×2): qty 1

## 2016-09-12 MED ORDER — SUCRALFATE 1 G PO TABS
1.0000 g | ORAL_TABLET | Freq: Three times a day (TID) | ORAL | Status: DC
  Administered 2016-09-13 – 2016-09-14 (×5): 1 g via ORAL
  Filled 2016-09-12 (×5): qty 1

## 2016-09-12 MED ORDER — METRONIDAZOLE IN NACL 5-0.79 MG/ML-% IV SOLN
INTRAVENOUS | Status: AC
  Administered 2016-09-12: 500 mg via INTRAVENOUS
  Filled 2016-09-12: qty 100

## 2016-09-12 MED ORDER — ASPIRIN 81 MG PO CHEW
81.0000 mg | CHEWABLE_TABLET | Freq: Every day | ORAL | Status: DC
  Administered 2016-09-13 – 2016-09-14 (×2): 81 mg via ORAL
  Filled 2016-09-12 (×2): qty 1

## 2016-09-12 MED ORDER — LEVOTHYROXINE SODIUM 50 MCG PO TABS
50.0000 ug | ORAL_TABLET | Freq: Every morning | ORAL | Status: DC
  Administered 2016-09-13 – 2016-09-14 (×2): 50 ug via ORAL
  Filled 2016-09-12 (×2): qty 1

## 2016-09-12 MED ORDER — PANTOPRAZOLE SODIUM 40 MG PO TBEC
40.0000 mg | DELAYED_RELEASE_TABLET | Freq: Two times a day (BID) | ORAL | Status: DC
  Administered 2016-09-13 – 2016-09-14 (×4): 40 mg via ORAL
  Filled 2016-09-12 (×4): qty 1

## 2016-09-12 MED ORDER — ENOXAPARIN SODIUM 40 MG/0.4ML ~~LOC~~ SOLN
40.0000 mg | SUBCUTANEOUS | Status: DC
  Administered 2016-09-13: 40 mg via SUBCUTANEOUS
  Filled 2016-09-12: qty 0.4

## 2016-09-12 MED ORDER — BUSPIRONE HCL 5 MG PO TABS
5.0000 mg | ORAL_TABLET | Freq: Two times a day (BID) | ORAL | Status: DC
  Administered 2016-09-13 – 2016-09-14 (×4): 5 mg via ORAL
  Filled 2016-09-12 (×5): qty 1

## 2016-09-12 MED ORDER — CIPROFLOXACIN IN D5W 400 MG/200ML IV SOLN
INTRAVENOUS | Status: AC
  Administered 2016-09-12: 400 mg via INTRAVENOUS
  Filled 2016-09-12: qty 200

## 2016-09-12 MED ORDER — ONDANSETRON HCL 4 MG PO TABS: 4.0000 mg | ORAL_TABLET | Freq: Four times a day (QID) | ORAL | Status: DC | PRN

## 2016-09-12 MED ORDER — TIZANIDINE HCL 2 MG PO TABS: 2.0000 mg | ORAL_TABLET | Freq: Four times a day (QID) | ORAL | Status: DC | PRN

## 2016-09-12 MED ORDER — ACETAMINOPHEN 325 MG PO TABS
650.0000 mg | ORAL_TABLET | Freq: Four times a day (QID) | ORAL | Status: DC | PRN
  Administered 2016-09-13: 650 mg via ORAL
  Filled 2016-09-12: qty 2

## 2016-09-12 MED ORDER — CIPROFLOXACIN IN D5W 400 MG/200ML IV SOLN
400.0000 mg | Freq: Two times a day (BID) | INTRAVENOUS | Status: DC
  Administered 2016-09-12 – 2016-09-14 (×4): 400 mg via INTRAVENOUS
  Filled 2016-09-12 (×4): qty 200

## 2016-09-12 NOTE — ED Notes (Signed)
ED Provider at bedside. 

## 2016-09-12 NOTE — H&P (Signed)
[facility] NAME: [name]    MR#:  [id number]  DATE OF BIRTH:  10-Apr-1945  DATE OF ADMISSION:  09/12/2016  PRIMARY CARE PHYSICIAN: Allyne Gee, MD   REQUESTING/REFERRING PHYSICIAN:   CHIEF COMPLAINT:   Chief Complaint  Patient presents with  . Abdominal Pain  . Diarrhea    HISTORY OF PRESENT ILLNESS: [name]  is a 71 y.o. female with a known history of Anxiety, depression, diabetes mellitus type 2, gastro-esophageal reflux disease, hypertension, hypothyroidism, who presents to the hospital with complaints of sudden episode of nausea, vomiting, diarrhea, chills, weakness, lower abdominal pain. On arrival to the hospital patient was noted to have colitis on CT scan, her bladder was noted to be also distended, Foley catheter was placed with some improvement of her abdominal pain. She admits to decreased clearance of urination since she was started on Vesicare. Patient denies hematemesis or hematochezia.   PAST MEDICAL HISTORY:   Past Medical History:  Diagnosis Date  . Anxiety   . Depression   . Diabetes mellitus, type II ([place])   . GERD (gastroesophageal reflux disease)   . Hypertension   . Thyroid disease     PAST SURGICAL HISTORY: Past Surgical History:  Procedure Laterality Date  . ABDOMINAL HYSTERECTOMY    . BREAST BIOPSY      SOCIAL HISTORY:  Social History  Substance Use Topics  . Smoking status: Never Smoker  . Smokeless tobacco: Never Used  . Alcohol use No    FAMILY HISTORY:  Family History  Problem Relation Age of Onset  . Anxiety disorder Mother   . Colon cancer Mother   . Heart disease Father   . Anxiety disorder Father   . Obesity Sister   . Anxiety disorder Sister   . Depression Sister   . Osteoporosis Sister     DRUG ALLERGIES: No Known Allergies  Review of Systems  Constitutional: Positive for chills, malaise/fatigue and weight loss. Negative for fever.  HENT:  Positive for congestion.   Eyes: Negative for blurred vision and double vision.  Respiratory: Negative for cough, sputum production, shortness of breath and wheezing.   Cardiovascular: Negative for chest pain, palpitations, orthopnea, leg swelling and PND.  Gastrointestinal: Positive for abdominal pain, nausea and vomiting. Negative for blood in stool, constipation and diarrhea.  Genitourinary: Negative for dysuria, frequency, hematuria and urgency.  Musculoskeletal: Negative for falls.  Neurological: Negative for dizziness, tremors, focal weakness and headaches.  Endo/Heme/Allergies: Does not bruise/bleed easily.  Psychiatric/Behavioral: Negative for depression. The patient does not have insomnia.     MEDICATIONS AT HOME:  Prior to Admission medications   Medication Sig Start Date End Date Taking? Authorizing Provider  aspirin 81 MG tablet Take 1 tablet by mouth daily.   Yes [provider]  busPIRone (BUSPAR) 5 MG tablet Take 5 mg by mouth 2 (two) times daily as needed.   Yes [provider]  citalopram (CELEXA) 40 MG tablet Take 1 tablet (40 mg total) by mouth daily. 01/13/16  Yes Ravi, Himabindu, MD  diltiazem (CARDIZEM CD) 240 MG 24 hr capsule Take 240 mg by mouth daily. 06/30/14  Yes [provider]  levothyroxine (SYNTHROID, LEVOTHROID) 50 MCG tablet Take 50 mcg by mouth every morning. 07/07/14  Yes [provider]  lisinopril (PRINIVIL,ZESTRIL) 20 MG tablet Take 20 mg by mouth daily. 06/28/14  Yes [provider]  metFORMIN (GLUCOPHAGE) 500 MG tablet Take 500 mg by  mouth 2 (two) times daily with a meal.  07/14/14  Yes [provider]  metoprolol tartrate (LOPRESSOR) 25 MG tablet TAKE 1 TWICE DAILY 06/23/14  Yes [provider]  metoprolol tartrate (LOPRESSOR) 25 MG tablet Take 25 mg by mouth 2 (two) times daily.   Yes [provider]  pantoprazole (PROTONIX) 40 MG tablet Take 40 mg by mouth 2 (two) times daily.    Yes  [provider]  QUEtiapine (SEROQUEL) 50 MG tablet Take 1 tablet (50 mg total) by mouth 2 (two) times daily. 01/13/16  Yes Ravi, Himabindu, MD  simvastatin (ZOCOR) 40 MG tablet Take 40 mg by mouth at bedtime. 07/05/14  Yes [provider]  VESICARE 5 MG tablet Take 5 mg by mouth at bedtime. 06/08/15  Yes [provider]  BELSOMRA 20 MG TABS Take 20 mg by mouth at bedtime as needed. 12/24/14   Marjie Skiff, MD  benzonatate (TESSALON PERLES) 100 MG capsule Take 1 capsule (100 mg total) by mouth every 6 (six) hours as needed for cough. Patient not taking: Reported on 09/12/2016 01/19/16 01/18/17  Nance Pear, MD  HYDROcodone-homatropine [facility]) 5-1.5 MG/5ML syrup Take 5 mLs by mouth every 6 (six) hours as needed for cough. Patient not taking: Reported on 09/12/2016 03/13/15   Mortimer Fries, PA-C  meloxicam (MOBIC) 7.5 MG tablet Take 7.5 mg by mouth 2 (two) times daily.    [provider]  oxycodone (OXY-IR) 5 MG capsule Take 5 mg by mouth every 4 (four) hours as needed.    [provider]  promethazine (PHENERGAN) 25 MG tablet Take 25 mg by mouth 2 (two) times daily as needed for nausea or vomiting.    [provider]  sucralfate (CARAFATE) 1 G tablet Take 1 g by mouth 4 (four) times daily as needed.     [provider]  tiZANidine (ZANAFLEX) 2 MG tablet Take 2 mg by mouth every 6 (six) hours as needed for muscle spasms.    [provider]      PHYSICAL EXAMINATION:   VITAL SIGNS: Blood pressure (!) 129/58, pulse 82, temperature 98.6 F (37 C), temperature source Oral, height 5' (1.524 m), weight 79.4 kg (175 lb), SpO2 100 %.  GENERAL:  71 y.o.-year-old pale patient lying in the bed with no acute distress.  EYES: Pupils equal, round, reactive to light and accommodation. No scleral icterus. Extraocular muscles intact.  HEENT: Head atraumatic, normocephalic. Oropharynx and nasopharynx clear.  NECK:  Supple, no jugular  venous distention. No thyroid enlargement, no tenderness.  LUNGS: Normal breath sounds bilaterally, no wheezing, rales,rhonchi or crepitation. No use of accessory muscles  Abdomen: Soft, tender to palpation in the left side of the abdomen, mostly left lower quadrant, some voluntary guarding and rebound, nondistended. Bowel sounds are present. No organomegaly or mass.  EXTREMITIES: No pedal edema, cyanosis, or clubbing. . Right knee incision, some increased warmth around the right knee itself and swelling, tenderness, healing from prior recent surgery NEUROLOGIC: Cranial nerves II through XII are intact. Muscle strength 5/5 in all extremities. Sensation intact. Gait not checked.  PSYCHIATRIC: The patient is alert and oriented x 3.  SKIN: No obvious rash, lesion, or ulcer.   LABORATORY PANEL:   CBC  Recent Labs Lab 09/12/16 1756  WBC 21.4*  HGB 13.0  HCT 39.1  PLT 296  MCV 89.1  MCH 29.5  MCHC 33.1  RDW 14.1  LYMPHSABS 1.5  MONOABS 1.2*  EOSABS 0.0  BASOSABS 0.1   ------------------------------------------------------------------------------------------------------------------  Chemistries   Recent Labs Lab 09/12/16 1756  NA 136  K 4.9  CL 103  CO2 19*  GLUCOSE 260*  BUN 11  CREATININE 1.34*  CALCIUM 9.9  AST 43*  ALT 18  ALKPHOS 57  BILITOT 0.8   ------------------------------------------------------------------------------------------------------------------  Cardiac Enzymes No results for input(s): TROPONINI in the last 168 hours. ------------------------------------------------------------------------------------------------------------------  RADIOLOGY: Ct Abdomen Pelvis W Contrast  Result Date: 09/12/2016 CLINICAL DATA:  Generalized abdominal pain, vomiting, diarrhea. EXAM: CT ABDOMEN AND PELVIS WITH CONTRAST TECHNIQUE: Multidetector CT imaging of the abdomen and pelvis was performed using the standard protocol following bolus administration of intravenous  contrast. CONTRAST:  46mL ISOVUE-300 IOPAMIDOL (ISOVUE-300) INJECTION 61% COMPARISON:  CT scan of April 25, 2016. FINDINGS: Lower chest: Large hiatal hernia is noted. Visualized lung bases are unremarkable. Hepatobiliary: No focal liver abnormality is seen. No gallstones, gallbladder wall thickening, or biliary dilatation. Pancreas: Unremarkable. No pancreatic ductal dilatation or surrounding inflammatory changes. Spleen: Normal in size without focal abnormality. Adrenals/Urinary Tract: Adrenal glands are unremarkable. Stable left renal cyst is noted. No hydronephrosis or renal obstruction is noted. No renal or ureteral calculi are noted. Severe urinary bladder distention is noted. Stomach/Bowel: The stomach and appendix appear normal. Wall thickening of the splenic flexure and descending colon is noted with inflammation consistent with infectious or inflammatory colitis. Sigmoid diverticulosis is noted without inflammation. Vascular/Lymphatic: Aortic atherosclerosis. No enlarged abdominal or pelvic lymph nodes. Reproductive: Status post hysterectomy. No adnexal masses. Other: No abdominal wall hernia or abnormality. No abdominopelvic ascites. Musculoskeletal: No acute or significant osseous findings. IMPRESSION: Large sliding-type hiatal hernia. Severe urinary bladder distention. Findings consistent with infectious or inflammatory colitis involving the splenic flexure and descending colon. Aortic atherosclerosis. Electronically Signed   By: Marijo Conception, M.D.   On: 09/12/2016 19:11    EKG: Orders placed or performed during the hospital encounter of 01/19/16  . ED EKG within 10 minutes  . ED EKG within 10 minutes  . EKG    IMPRESSION AND PLAN:  Active Problems:   Acute ischemic colitis (HCC)   Nausea and vomiting   Acute urinary retention   Acute renal insufficiency   Colitis   #1 acute colitis of unclear etiology, concerning for ischemic, continue patient on IV fluids, hold blood pressure  medications, since patient became hypotensive anyway, initiate her on Cipro and Flagyl, awaiting for gastrointestinal panel, C. difficile testing results, enteric precautions  . #2. Nausea and vomiting, supportive therapy, IV fluids, clear liquid diet, PPI #3. Acute urinary retention, status post Foley catheter placement, getting urinalysis #4. Leukocytosis, follow with therapy #5. Lactic acidosis, continue IV fluids, antibiotic therapy, follow lactic acid levels   All the records are reviewed and case discussed with ED provider. Management plans discussed with the patient, family and they are in agreement.  CODE STATUS: Code Status History    This patient does not have a recorded code status. Please follow your organizational policy for patients in this situation.       TOTAL TIME TAKING CARE OF THIS PATIENT: 55 minutes.    Theodoro Grist M.D on 09/12/2016 at 8:04 PM  Between 7am to 6pm - Pager - [phone] After 6pm go to [email or web address] - password [email or web address] [facility]  Office  [phone]  CC: Primary care physician; Allyne Gee, MD

## 2016-09-12 NOTE — ED Triage Notes (Signed)
Pt ate strawberry short cake both yesterday and this morning. Pt felt ill after eating the cake this morning around 12pm and called daughter into the room. Pt states nausea, vomiting, and diarrhea all happened at once. Pt reports no dizziness or loss of consciousness, just feels weak.

## 2016-09-12 NOTE — ED Provider Notes (Addendum)
[facility] Emergency Department Provider Note  ____________________________________________   I have reviewed the triage vital signs and the nursing notes.   HISTORY  Chief Complaint Abdominal Pain and Diarrhea    HPI [name] is a 71 y.o. female with a history of "colitis" in [place] last year, history of reflux disease and diabetes was in her normal state of health today, when she beganto have copious diarrhea "I don't think there is anything left", non-melanotic nonbloody. Patient has had no fever no chills. Did have copious nonbloody non-bilious vomiting as well. Complains of diffuse abdominal pain.  Nothing makes it better and nothing makes it worse, onset was this afternoon, progressive. No recent antibiotics, no recent travel. Diffuse nonradiating abdominal pain more in the left than the right   Past Medical History:  Diagnosis Date  . Anxiety   . Depression   . Diabetes mellitus, type II ([place])   . GERD (gastroesophageal reflux disease)   . Thyroid disease     Patient Active Problem List   Diagnosis Date Noted  . Depression, major, recurrent, moderate ([place]) 07/22/2014  . Anxiety, generalized 07/22/2014  . Insomnia, persistent 07/22/2014  . Panic disorder without agoraphobia 07/22/2014  . H/O: obesity 06/01/2014  . H/O: HTN (hypertension) 06/01/2014  . H/O gastrointestinal disease 06/01/2014  . H/O diabetes mellitus 06/01/2014  . H/O elevated lipids 06/01/2014  . History of hay fever 06/01/2014    Past Surgical History:  Procedure Laterality Date  . ABDOMINAL HYSTERECTOMY    . BREAST BIOPSY      Prior to Admission medications   Medication Sig Start Date End Date Taking? Authorizing Provider  aspirin 81 MG tablet Take 1 tablet by mouth daily.   Yes [provider]  busPIRone (BUSPAR) 5 MG tablet Take 5 mg by mouth 2 (two) times daily as needed.   Yes [provider]  citalopram (CELEXA) 40 MG tablet Take 1  tablet (40 mg total) by mouth daily. 01/13/16  Yes Ravi, Himabindu, MD  diltiazem (CARDIZEM CD) 240 MG 24 hr capsule Take 240 mg by mouth daily. 06/30/14  Yes [provider]  levothyroxine (SYNTHROID, LEVOTHROID) 50 MCG tablet Take 50 mcg by mouth every morning. 07/07/14  Yes [provider]  lisinopril (PRINIVIL,ZESTRIL) 20 MG tablet Take 20 mg by mouth daily. 06/28/14  Yes [provider]  metFORMIN (GLUCOPHAGE) 500 MG tablet Take 500 mg by mouth 2 (two) times daily with a meal.  07/14/14  Yes [provider]  metoprolol tartrate (LOPRESSOR) 25 MG tablet TAKE 1 TWICE DAILY 06/23/14  Yes [provider]  metoprolol tartrate (LOPRESSOR) 25 MG tablet Take 25 mg by mouth 2 (two) times daily.   Yes [provider]  pantoprazole (PROTONIX) 40 MG tablet Take 40 mg by mouth 2 (two) times daily.    Yes [provider]  QUEtiapine (SEROQUEL) 50 MG tablet Take 1 tablet (50 mg total) by mouth 2 (two) times daily. 01/13/16  Yes Ravi, Himabindu, MD  simvastatin (ZOCOR) 40 MG tablet Take 40 mg by mouth at bedtime. 07/05/14  Yes [provider]  VESICARE 5 MG tablet Take 5 mg by mouth at bedtime. 06/08/15  Yes [provider]  BELSOMRA 20 MG TABS Take 20 mg by mouth at bedtime as needed. 12/24/14   Marjie Skiff, MD  benzonatate (TESSALON PERLES) 100 MG capsule Take 1 capsule (100 mg total) by mouth every 6 (six) hours as needed for cough. Patient not taking: Reported  on 09/12/2016 01/19/16 01/18/17  Nance Pear, MD  HYDROcodone-homatropine [facility]) 5-1.5 MG/5ML syrup Take 5 mLs by mouth every 6 (six) hours as needed for cough. Patient not taking: Reported on 09/12/2016 03/13/15   Mortimer Fries, PA-C  meloxicam (MOBIC) 7.5 MG tablet Take 7.5 mg by mouth 2 (two) times daily.    [provider]  oxycodone (OXY-IR) 5 MG capsule Take 5 mg by mouth every 4 (four) hours as needed.    [provider]  promethazine (PHENERGAN)  25 MG tablet Take 25 mg by mouth 2 (two) times daily as needed for nausea or vomiting.    [provider]  sucralfate (CARAFATE) 1 G tablet Take 1 g by mouth 4 (four) times daily as needed.     [provider]  tiZANidine (ZANAFLEX) 2 MG tablet Take 2 mg by mouth every 6 (six) hours as needed for muscle spasms.    [provider]    Allergies Patient has no known allergies.  Family History  Problem Relation Age of Onset  . Anxiety disorder Mother   . Colon cancer Mother   . Heart disease Father   . Anxiety disorder Father   . Obesity Sister   . Anxiety disorder Sister   . Depression Sister   . Osteoporosis Sister     Social History Social History  Substance Use Topics  . Smoking status: Never Smoker  . Smokeless tobacco: Never Used  . Alcohol use No    Review of Systems Constitutional: No fever/chills Eyes: No visual changes. ENT: No sore throat. No stiff neck no neck pain Cardiovascular: Denies chest pain. Respiratory: Denies shortness of breath. Gastrointestinal:  See history of present illness Genitourinary: Negative for dysuria. Musculoskeletal: Negative lower extremity swelling Skin: Negative for rash. Neurological: Negative for severe headaches, focal weakness or numbness.   ____________________________________________   PHYSICAL EXAM:  VITAL SIGNS: ED Triage Vitals  Enc Vitals Group     BP 09/12/16 1734 (!) 129/58     Pulse Rate 09/12/16 1734 82     Resp --      Temp 09/12/16 1734 98.6 F (37 C)     Temp Source 09/12/16 1734 Oral     SpO2 09/12/16 1734 100 %     Weight 09/12/16 1736 175 lb (79.4 kg)     Height 09/12/16 1736 5' (1.524 m)     Head Circumference --      Peak Flow --      Pain Score --      Pain Loc --      Pain Edu? --      Excl. in [place]? --     Constitutional: Alert and oriented. Looks somewhat diffusely weak but nontoxic Eyes: Conjunctivae are normal Head: Atraumatic HEENT: No congestion/rhinnorhea.  Mucous membranes are moist.  Oropharynx non-erythematous Neck:   Nontender with no meningismus, no masses, no stridor Cardiovascular: Normal rate, regular rhythm. Grossly normal heart sounds.  Good peripheral circulation. Respiratory: Normal respiratory effort.  No retractions. Lungs CTAB. Abdominal: Soft and Diffusely tender more on the left than the right no guarding or rebound. No distention. Back:  There is no focal tenderness or step off.  there is no midline tenderness there are no lesions noted. there is no CVA tenderness Musculoskeletal: No lower extremity tenderness, no upper extremity tenderness. No joint effusions, no DVT signs strong distal pulses no edema Neurologic:  Normal speech and language. No gross focal neurologic deficits are appreciated.  Skin:  Skin is warm,  dry and intact. No rash noted. Psychiatric: Mood and affect are normal. Speech and behavior are normal.  ____________________________________________   LABS (all labs ordered are listed, but only abnormal results are displayed)  Labs Reviewed  CBC WITH DIFFERENTIAL/PLATELET - Abnormal; Notable for the following:       Result Value   WBC 21.4 (*)    Neutro Abs 18.6 (*)    Monocytes Absolute 1.2 (*)    All other components within normal limits  GASTROINTESTINAL PANEL BY PCR, STOOL (REPLACES STOOL CULTURE)  C DIFFICILE QUICK SCREEN W PCR REFLEX  COMPREHENSIVE METABOLIC PANEL  LIPASE, BLOOD  LACTIC ACID, PLASMA  LACTIC ACID, PLASMA   ____________________________________________  EKG  I personally interpreted any EKGs ordered by me or triage  ____________________________________________  RADIOLOGY  I reviewed any imaging ordered by me or triage that were performed during my shift and, if possible, patient and/or family made aware of any abnormal findings. ____________________________________________   PROCEDURES  Procedure(s) performed: None  Procedures  Critical Care performed:  None  ____________________________________________   INITIAL IMPRESSION / ASSESSMENT AND PLAN / ED COURSE  Pertinent labs & imaging results that were available during my care of the patient were reviewed by me and considered in my medical decision making (see chart for details).  She with explosive and sudden nausea vomiting diarrhea and abdominal pain. We are going to obtain CT scan give her IV fluids and continue to observe. Differential is quite broad.  ----------------------------------------- 7:18 PM on 09/12/2016 -----------------------------------------  Evidence of colitis but no other acute pathology on CT aside from a very large urinary bladder which I ordered a noted on my own review of the CT and we are inserting a urinary catheter. Patient does take Vesicare. Apparently she has had trouble with urinary retention while taking this medication in the past. We will gradually let off that very large urine. A certainly possible some of her symptoms will relieve especially her discomfort after that. Nonetheless she does have a lactic of 5 evidence of colitis on CT, stool culture and C. difficile is pending. No history of C. difficile that they know of. We'll defer antibiotics pending identification positive agent. Patient will require admission to the hospital. She is looking better.    ____________________________________________   FINAL CLINICAL IMPRESSION(S) / ED DIAGNOSES  Final diagnoses:  None      This chart was dictated using voice recognition software.  Despite best efforts to proofread,  errors can occur which can change meaning.      Schuyler Amor, MD 09/12/16 1844    Schuyler Amor, MD 09/12/16 1919

## 2016-09-12 NOTE — ED Notes (Signed)
Foley catheter unclamped at this time, will continue to monitor output.

## 2016-09-13 LAB — BASIC METABOLIC PANEL
ANION GAP: 6 (ref 5–15)
BUN: 6 mg/dL (ref 6–20)
CHLORIDE: 109 mmol/L (ref 101–111)
CO2: 24 mmol/L (ref 22–32)
Calcium: 8.5 mg/dL — ABNORMAL LOW (ref 8.9–10.3)
Creatinine, Ser: 0.76 mg/dL (ref 0.44–1.00)
GFR calc non Af Amer: >60 mL/min (ref >60)
Glucose, Bld: 108 mg/dL — ABNORMAL HIGH (ref 65–99)
Potassium: 3.8 mmol/L (ref 3.5–5.1)
Sodium: 139 mmol/L (ref 135–145)

## 2016-09-13 LAB — CBC
HEMATOCRIT: 35.2 % (ref 35.0–47.0)
HEMOGLOBIN: 11.5 g/dL — AB (ref 12.0–16.0)
MCH: 28.9 pg (ref 26.0–34.0)
MCHC: 32.8 g/dL (ref 32.0–36.0)
MCV: 88.3 fL (ref 80.0–100.0)
Platelets: 272 10*3/uL (ref 150–440)
RBC: 3.98 MIL/uL (ref 3.80–5.20)
RDW: 14.1 % (ref 11.5–14.5)
WBC: 14.7 10*3/uL — ABNORMAL HIGH (ref 3.6–11.0)

## 2016-09-13 LAB — LACTIC ACID, PLASMA: Lactic Acid, Venous: 1.2 mmol/L (ref 0.5–1.9)

## 2016-09-13 LAB — GLUCOSE, CAPILLARY
GLUCOSE-CAPILLARY: 136 mg/dL — AB (ref 65–99)
Glucose-Capillary: 106 mg/dL — ABNORMAL HIGH (ref 65–99)
Glucose-Capillary: 134 mg/dL — ABNORMAL HIGH (ref 65–99)

## 2016-09-13 LAB — TSH: TSH: 1.025 u[IU]/mL (ref 0.350–4.500)

## 2016-09-13 MED ORDER — DILTIAZEM HCL ER COATED BEADS 120 MG PO CP24
240.0000 mg | ORAL_CAPSULE | Freq: Every day | ORAL | Status: DC
  Administered 2016-09-13 – 2016-09-14 (×2): 240 mg via ORAL
  Filled 2016-09-13 (×2): qty 2

## 2016-09-13 MED ORDER — SUVOREXANT 20 MG PO TABS: 20.0000 mg | ORAL_TABLET | Freq: Every evening | ORAL | Status: DC | PRN

## 2016-09-13 MED ORDER — LISINOPRIL 10 MG PO TABS
5.0000 mg | ORAL_TABLET | Freq: Every day | ORAL | Status: DC
  Administered 2016-09-14: 5 mg via ORAL
  Filled 2016-09-13: qty 1

## 2016-09-13 MED ORDER — ZOLPIDEM TARTRATE 5 MG PO TABS
5.0000 mg | ORAL_TABLET | Freq: Every evening | ORAL | Status: DC | PRN
  Administered 2016-09-13 (×2): 5 mg via ORAL
  Filled 2016-09-13 (×2): qty 1

## 2016-09-13 MED ORDER — INSULIN ASPART 100 UNIT/ML ~~LOC~~ SOLN
0.0000 [IU] | Freq: Three times a day (TID) | SUBCUTANEOUS | Status: DC
  Administered 2016-09-13 – 2016-09-14 (×2): 1 [IU] via SUBCUTANEOUS
  Filled 2016-09-13 (×2): qty 1

## 2016-09-13 MED ORDER — INSULIN ASPART 100 UNIT/ML ~~LOC~~ SOLN: 0.0000 [IU] | Freq: Every day | SUBCUTANEOUS | Status: DC

## 2016-09-13 MED ORDER — DIPHENHYDRAMINE HCL 25 MG PO CAPS
25.0000 mg | ORAL_CAPSULE | Freq: Four times a day (QID) | ORAL | Status: DC | PRN
  Administered 2016-09-13: 25 mg via ORAL
  Filled 2016-09-13: qty 1

## 2016-09-13 MED ORDER — METOPROLOL TARTRATE 50 MG PO TABS
50.0000 mg | ORAL_TABLET | Freq: Two times a day (BID) | ORAL | Status: DC
  Administered 2016-09-13 – 2016-09-14 (×2): 50 mg via ORAL
  Filled 2016-09-13 (×2): qty 1

## 2016-09-13 MED ORDER — METOPROLOL TARTRATE 25 MG PO TABS
25.0000 mg | ORAL_TABLET | Freq: Two times a day (BID) | ORAL | Status: DC
  Filled 2016-09-13: qty 1

## 2016-09-13 MED ORDER — LISINOPRIL 20 MG PO TABS
20.0000 mg | ORAL_TABLET | Freq: Every day | ORAL | Status: DC
  Administered 2016-09-13: 20 mg via ORAL
  Filled 2016-09-13: qty 1

## 2016-09-13 NOTE — Progress Notes (Signed)
[place] [facility] [place] NAME: [name]    MR#:  [id number]  DATE OF BIRTH:  August 23, 1945  SUBJECTIVE:  CHIEF COMPLAINT:   Chief Complaint  Patient presents with  . Abdominal Pain  . Diarrhea  Feeling much better, minimal abdominal pain.  If any REVIEW OF SYSTEMS:  Review of Systems  Constitutional: Negative for chills, fever and weight loss.  HENT: Negative for nosebleeds and sore throat.   Eyes: Negative for blurred vision.  Respiratory: Negative for cough, shortness of breath and wheezing.   Cardiovascular: Negative for chest pain, orthopnea, leg swelling and PND.  Gastrointestinal: Positive for abdominal pain. Negative for constipation, diarrhea, heartburn, nausea and vomiting.  Genitourinary: Negative for dysuria and urgency.  Musculoskeletal: Negative for back pain.  Skin: Negative for rash.  Neurological: Negative for dizziness, speech change, focal weakness and headaches.  Endo/Heme/Allergies: Does not bruise/bleed easily.  Psychiatric/Behavioral: Negative for depression.    DRUG ALLERGIES:  No Known Allergies VITALS:  Blood pressure (!) 138/59, pulse (!) 120, temperature 98.8 F (37.1 C), temperature source Oral, resp. rate 16, height 5' (1.524 m), weight 79.7 kg (175 lb 11.2 oz), SpO2 95 %. PHYSICAL EXAMINATION:  Physical Exam  Constitutional: She is oriented to person, place, and time and well-developed, well-nourished, and in no distress.  HENT:  Head: Normocephalic and atraumatic.  Eyes: Pupils are equal, round, and reactive to light. Conjunctivae and EOM are normal.  Neck: Normal range of motion. Neck supple. No tracheal deviation present. No thyromegaly present.  Cardiovascular: Normal rate, regular rhythm and normal heart sounds.   Pulmonary/Chest: Effort normal and breath sounds normal. No respiratory distress. She has no wheezes. She exhibits no tenderness.  Abdominal: Soft. Bowel sounds are normal. She  exhibits no distension. There is no tenderness.  Musculoskeletal: Normal range of motion.  Neurological: She is alert and oriented to person, place, and time. No cranial nerve deficit.  Skin: Skin is warm and dry. No rash noted.  Psychiatric: Mood and affect normal.   LABORATORY PANEL:  Female CBC  Recent Labs Lab 09/13/16 0507  WBC 14.7*  HGB 11.5*  HCT 35.2  PLT 272   ------------------------------------------------------------------------------------------------------------------ Chemistries   Recent Labs Lab 09/12/16 1756 09/13/16 0507  NA 136 139  K 4.9 3.8  CL 103 109  CO2 19* 24  GLUCOSE 260* 108*  BUN 11 6  CREATININE 1.34* 0.76  CALCIUM 9.9 8.5*  AST 43*  --   ALT 18  --   ALKPHOS 57  --   BILITOT 0.8  --    RADIOLOGY:  Ct Abdomen Pelvis W Contrast  Result Date: 09/12/2016 CLINICAL DATA:  Generalized abdominal pain, vomiting, diarrhea. EXAM: CT ABDOMEN AND PELVIS WITH CONTRAST TECHNIQUE: Multidetector CT imaging of the abdomen and pelvis was performed using the standard protocol following bolus administration of intravenous contrast. CONTRAST:  41mL ISOVUE-300 IOPAMIDOL (ISOVUE-300) INJECTION 61% COMPARISON:  CT scan of April 25, 2016. FINDINGS: Lower chest: Large hiatal hernia is noted. Visualized lung bases are unremarkable. Hepatobiliary: No focal liver abnormality is seen. No gallstones, gallbladder wall thickening, or biliary dilatation. Pancreas: Unremarkable. No pancreatic ductal dilatation or surrounding inflammatory changes. Spleen: Normal in size without focal abnormality. Adrenals/Urinary Tract: Adrenal glands are unremarkable. Stable left renal cyst is noted. No hydronephrosis or renal obstruction is noted. No renal or ureteral calculi are noted. Severe urinary bladder distention is noted. Stomach/Bowel: The stomach and appendix appear normal. Wall thickening of the splenic flexure and  descending colon is noted with inflammation consistent with infectious or  inflammatory colitis. Sigmoid diverticulosis is noted without inflammation. Vascular/Lymphatic: Aortic atherosclerosis. No enlarged abdominal or pelvic lymph nodes. Reproductive: Status post hysterectomy. No adnexal masses. Other: No abdominal wall hernia or abnormality. No abdominopelvic ascites. Musculoskeletal: No acute or significant osseous findings. IMPRESSION: Large sliding-type hiatal hernia. Severe urinary bladder distention. Findings consistent with infectious or inflammatory colitis involving the splenic flexure and descending colon. Aortic atherosclerosis. Electronically Signed   By: Marijo Conception, M.D.   On: 09/12/2016 19:11   ASSESSMENT AND PLAN:    #1 acute colitis  - continue Cipro and Flagyl, Patient has not had any stool while here in the hospital  #2.  Hypertension/tachycardia - We will cut back does of lisinopril from 20 mg to 5 mg and increase the dose of metoprolol from 25->50 mg twice a day  #3. Acute urinary retention, status post Foley catheter placement in the emergency department, urinalysis is negative for any acute pathology.  Will request for removal  #4. Leukocytosis: Monitor. could be due to colitis  #5. Lactic acidosis, continue IV fluids, antibiotic therapy, this has resolved   Discontinue IV fluids, advance diet  All the records are reviewed and case discussed with Care Management/Social Worker. Management plans discussed with the patient, family and they are in agreement.  CODE STATUS: Full Code  TOTAL TIME TAKING CARE OF THIS PATIENT: 35 minutes.   More than 50% of the time was spent in counseling/coordination of care: YES  POSSIBLE D/C IN 1-2 DAYS, DEPENDING ON CLINICAL CONDITION.   Max Sane M.D on 09/13/2016 at 4:16 PM  Between 7am to 6pm - Pager - [phone]  After 6pm go to [email or web address] - password [email or web address] [facility]  [employer] [place] [facility]  Office  [phone]  CC: Primary care physician; Allyne Gee, MD  Note: This  dictation was prepared with Dragon dictation along with smaller phrase technology. Any transcriptional errors that result from this process are unintentional.

## 2016-09-13 NOTE — Progress Notes (Signed)
Chaplain received an Order Requisition that patient wanted information about an Forensic scientist. Chaplain had to suit up to enter patients room. Chaplain spoke with patient about the information. Patient said she had to wait on her daughter with her glasses and they would look over information.

## 2016-09-13 NOTE — Progress Notes (Signed)
Initial Nutrition Assessment  DOCUMENTATION CODES:   Obesity unspecified  INTERVENTION:  Encouraged adequate intake of calories and protein as diet is advanced. Discussed sources of protein and encouraged patient to have protein at each meal. Discussed that having high-protein snacks between meals can help prevent loss of body weight/lean body mass.   Discussed patient's fluid goals. Encouraged intake of adequate fluid in setting of diarrhea.  NUTRITION DIAGNOSIS:   Inadequate oral intake related to poor appetite as evidenced by per patient/family report.  GOAL:   Patient will meet greater than or equal to 90% of their needs  MONITOR:   PO intake, Labs, Weight trends, I & O's  REASON FOR ASSESSMENT:   Malnutrition Screening Tool    ASSESSMENT:   71 year old female with PMHx of anxiety, depression, hypothyroidism, DM type 2, GERD, HTN who presents with N/V, diarrhea, lower abdominal pain found ot have acute colitis of unclear etiology on CT Abd/Pelvis on 8/6.   Spoke with patient at bedside. She reports her appetite has been decreased for approximately one month following her right knee replacement on 08/15/2016. She is still eating 3 meals per day, but feels like she is not eating as much as she used to. Unable to clarify exactly what types of food she is eating now. Patient reports yesterday before coming into hospital she had acute onset of nausea, one episode of emesis, abdominal pain, and diarrhea. She reports that she already feels better today and tolerated her clear liquid breakfast well. She finished everything except the coffee (pt reports she does not like coffee). She believes her appetite will be able to return to normal. She does not believe she will need any oral nutrition supplement to help meet her calorie/protein needs.  UBW 185 lbs. Patient reports she has lost 9.3 lbs (5% body weight) over the past month, which is significant for time frame.  Medications reviewed  and include: levothyroxine, pantoprazole, sucralfate 1 gram TID with meals and QHS, NS @ 125 ml/hr, Cipro, Flagyl.  Labs reviewed: CBG 106, Lactic Acid 4.4. Last HgbA1c was 7.4 on 07/17/2013.  Nutrition-Focused physical exam completed. Findings are no fat depletion, no muscle depletion, and no edema.   Patient does not meet criteria for malnutrition at this time, but is at risk for acute malnutrition in setting of 5% weight loss over 1 month.  Diet Order:  Diet clear liquid Room service appropriate? Yes; Fluid consistency: Thin  Skin:  Reviewed, no issues  Last BM:  09/13/2016  Height:   Ht Readings from Last 1 Encounters:  09/12/16 5' (1.524 m)    Weight:   Wt Readings from Last 1 Encounters:  09/13/16 175 lb 11.2 oz (79.7 kg)    Ideal Body Weight:  45.5 kg  BMI:  Body mass index is 34.31 kg/m.  Estimated Nutritional Needs:   Kcal:  1485-1730 (MSJ x 1.2-1.4)  Protein:  80-95 grams (1-1.2 grams/kg)  Fluid:  1.5-1.7 L/day (1 ml/kcal)  EDUCATION NEEDS:   Education needs addressed  Willey Blade, MS, RD, LDN Pager: [phone] After Hours Pager: [phone]

## 2016-09-13 NOTE — Progress Notes (Signed)
Inpatient Diabetes Program Recommendations  AACE/ADA: New Consensus Statement on Inpatient Glycemic Control (2015)  Target Ranges:  Prepandial:   less than 140 mg/dL      Peak postprandial:   less than 180 mg/dL (1-2 hours)      Critically ill patients:  140 - 180 mg/dL   Results for [name], [name] (MRN [id number]) as of 09/13/2016 09:49  Ref. Range 09/13/2016 08:16  Glucose-Capillary Latest Ref Range: 65 - 99 mg/dL 106 (H)  Results for [name], [name] (MRN [id number]) as of 09/13/2016 09:49  Ref. Range 09/12/2016 17:56 09/13/2016 05:07  Glucose Latest Ref Range: 65 - 99 mg/dL 260 (H) 108 (H)   Review of Glycemic Control  Diabetes history: DM2 Outpatient Diabetes medications: Metformin 500 mg BID Current orders for Inpatient glycemic control: None  Inpatient Diabetes Program Recommendations: Correction (SSI): Please consider ordering CBGs with Novolog 0-9 units TID with meals and Novolog 0-5 units QHS. A1C: Please consider ordering an A1C to evaluate glycemic control over the past 2-3 months.  Thanks, Barnie Alderman, RN, MSN, CDE Diabetes Coordinator Inpatient Diabetes Program [phone] (Team Pager from 8am to 5pm)

## 2016-09-14 LAB — CBC
HEMATOCRIT: 32.6 % — AB (ref 35.0–47.0)
HEMOGLOBIN: 10.9 g/dL — AB (ref 12.0–16.0)
MCH: 29.5 pg (ref 26.0–34.0)
MCHC: 33.5 g/dL (ref 32.0–36.0)
MCV: 88 fL (ref 80.0–100.0)
Platelets: 251 10*3/uL (ref 150–440)
RBC: 3.71 MIL/uL — AB (ref 3.80–5.20)
RDW: 13.9 % (ref 11.5–14.5)
WBC: 11.1 10*3/uL — AB (ref 3.6–11.0)

## 2016-09-14 LAB — BASIC METABOLIC PANEL
ANION GAP: 8 (ref 5–15)
BUN: 7 mg/dL (ref 6–20)
CO2: 26 mmol/L (ref 22–32)
Calcium: 9 mg/dL (ref 8.9–10.3)
Chloride: 107 mmol/L (ref 101–111)
Creatinine, Ser: 1 mg/dL (ref 0.44–1.00)
GFR calc non Af Amer: 55 mL/min — ABNORMAL LOW (ref >60)
GLUCOSE: 136 mg/dL — AB (ref 65–99)
POTASSIUM: 3.7 mmol/L (ref 3.5–5.1)
Sodium: 141 mmol/L (ref 135–145)

## 2016-09-14 LAB — URINE CULTURE: CULTURE: NO GROWTH

## 2016-09-14 LAB — GLUCOSE, CAPILLARY: Glucose-Capillary: 126 mg/dL — ABNORMAL HIGH (ref 65–99)

## 2016-09-14 LAB — HEMOGLOBIN A1C
Hgb A1c MFr Bld: 6.1 % — ABNORMAL HIGH (ref 4.8–5.6)
Mean Plasma Glucose: 128 mg/dL

## 2016-09-14 MED ORDER — METOPROLOL TARTRATE 50 MG PO TABS: 50.0000 mg | ORAL_TABLET | Freq: Two times a day (BID) | ORAL | 0 refills | Status: DC

## 2016-09-14 MED ORDER — METRONIDAZOLE 500 MG PO TABS: 500.0000 mg | ORAL_TABLET | Freq: Three times a day (TID) | ORAL | 0 refills | Status: AC

## 2016-09-14 MED ORDER — ATORVASTATIN CALCIUM 20 MG PO TABS: 20.0000 mg | ORAL_TABLET | Freq: Every day | ORAL | 0 refills | Status: DC

## 2016-09-14 MED ORDER — CIPROFLOXACIN HCL 500 MG PO TABS: 500.0000 mg | ORAL_TABLET | Freq: Two times a day (BID) | ORAL | 0 refills | Status: AC

## 2016-09-14 NOTE — Care Management (Signed)
Patient admitted from home with colitis. PCP KHAN.  Notified by [place] from [facility] that patient open with home health services for PT.  Resumption orders have been placed, as well as addition of RN.  Patient was discharged home today.  RNCM signed off.

## 2016-09-14 NOTE — Discharge Instructions (Signed)
Colitis Colitis is inflammation of the colon. Colitis may last a short time (acute) or it may last a long time (chronic). What are the causes? This condition may be caused by:  Viruses.  Bacteria.  Reactions to medicine.  Certain autoimmune diseases, such as Crohn disease or ulcerative colitis.  What are the signs or symptoms? Symptoms of this condition include:  Diarrhea.  Passing bloody or tarry stool.  Pain.  Fever.  Vomiting.  Tiredness (fatigue).  Weight loss.  Bloating.  Sudden increase in abdominal pain.  Having fewer bowel movements than usual.  How is this diagnosed? This condition is diagnosed with a stool test or a blood test. You may also have other tests, including X-rays, a CT scan, or a colonoscopy. How is this treated? Treatment may include:  Resting the bowel. This involves not eating or drinking for a period of time.  Fluids that are given through an IV tube.  Medicine for pain and diarrhea.  Antibiotic medicines.  Cortisone medicines.  Surgery.  Follow these instructions at home: Eating and drinking  Follow instructions from your health care provider about eating or drinking restrictions.  Drink enough fluid to keep your urine clear or pale yellow.  Work with a dietitian to determine which foods cause your condition to flare up.  Avoid foods that cause flare-ups.  Eat a well-balanced diet. Medicines  Take over-the-counter and prescription medicines only as told by your health care provider.  If you were prescribed an antibiotic medicine, take it as told by your health care provider. Do not stop taking the antibiotic even if you start to feel better. General instructions  Keep all follow-up visits as told by your health care provider. This is important. Contact a health care provider if:  Your symptoms do not go away.  You develop new symptoms. Get help right away if:  You have a fever that does not go away with  treatment.  You develop chills.  You have extreme weakness, fainting, or dehydration.  You have repeated vomiting.  You develop severe pain in your abdomen.  You pass bloody or tarry stool. This information is not intended to replace advice given to you by your health care provider. Make sure you discuss any questions you have with your health care provider. Document Released: 03/03/2004 Document Revised: 07/02/2015 Document Reviewed: 05/19/2014 Elsevier Interactive Patient Education  2018 [employer]  

## 2016-09-14 NOTE — Progress Notes (Signed)
Discharge paperwork reviewed with pt and daughter. Questions answered to satisfaction. IV removed. Pt taken to car via wheelchair.

## 2016-09-15 DIAGNOSIS — Z7901 Long term (current) use of anticoagulants: Secondary | ICD-10-CM | POA: Diagnosis not present

## 2016-09-15 DIAGNOSIS — E119 Type 2 diabetes mellitus without complications: Secondary | ICD-10-CM | POA: Diagnosis not present

## 2016-09-15 DIAGNOSIS — Z471 Aftercare following joint replacement surgery: Secondary | ICD-10-CM | POA: Diagnosis not present

## 2016-09-15 DIAGNOSIS — E079 Disorder of thyroid, unspecified: Secondary | ICD-10-CM | POA: Diagnosis not present

## 2016-09-15 DIAGNOSIS — Z9181 History of falling: Secondary | ICD-10-CM | POA: Diagnosis not present

## 2016-09-15 DIAGNOSIS — D72829 Elevated white blood cell count, unspecified: Secondary | ICD-10-CM | POA: Diagnosis not present

## 2016-09-15 DIAGNOSIS — M25561 Pain in right knee: Secondary | ICD-10-CM | POA: Diagnosis not present

## 2016-09-15 DIAGNOSIS — M1990 Unspecified osteoarthritis, unspecified site: Secondary | ICD-10-CM | POA: Diagnosis not present

## 2016-09-15 DIAGNOSIS — A09 Infectious gastroenteritis and colitis, unspecified: Secondary | ICD-10-CM | POA: Diagnosis not present

## 2016-09-15 DIAGNOSIS — G4730 Sleep apnea, unspecified: Secondary | ICD-10-CM | POA: Diagnosis not present

## 2016-09-15 DIAGNOSIS — I10 Essential (primary) hypertension: Secondary | ICD-10-CM | POA: Diagnosis not present

## 2016-09-15 DIAGNOSIS — Z96651 Presence of right artificial knee joint: Secondary | ICD-10-CM | POA: Diagnosis not present

## 2016-09-15 DIAGNOSIS — Z7984 Long term (current) use of oral hypoglycemic drugs: Secondary | ICD-10-CM | POA: Diagnosis not present

## 2016-09-15 DIAGNOSIS — E860 Dehydration: Secondary | ICD-10-CM | POA: Diagnosis not present

## 2016-09-15 NOTE — Discharge Summary (Signed)
[facility] [place] NAME: [name]    MR#:  [id number]  DATE OF BIRTH:  May 06, 1945  DATE OF ADMISSION:  09/12/2016   ADMITTING PHYSICIAN: Theodoro Grist, MD  DATE OF DISCHARGE: 09/14/2016  1:15 PM  PRIMARY CARE PHYSICIAN: Allyne Gee, MD   ADMISSION DIAGNOSIS:  Urinary retention [R33.9] Nausea vomiting and diarrhea [R11.2, R19.7] Abdominal pain, unspecified abdominal location [R10.9] DISCHARGE DIAGNOSIS:  Active Problems:   Acute ischemic colitis ([place])   Nausea and vomiting   Acute urinary retention   Acute renal insufficiency   Colitis  SECONDARY DIAGNOSIS:   Past Medical History:  Diagnosis Date  . Anxiety   . Depression   . Diabetes mellitus, type II ([place])   . GERD (gastroesophageal reflux disease)   . Hypertension   . Thyroid disease    HOSPITAL COURSE:   #1 acute colitis  - improved on Cipro and Flagyl and being DCed on same.  #2.  Hypertension/tachycardia - stopped lisinopril and increased the dose of metoprolol from 25->50 mg twice a day  #3. Acute urinary retention, status post Foley catheter placement in the emergency department, urinalysis is negative for any acute pathology.  resolved and foley removed  #4. Leukocytosis: due to colitis  #5. Lactic acidosis: resolved with treatment  DISCHARGE CONDITIONS:  stable CONSULTS OBTAINED:   DRUG ALLERGIES:  No Known Allergies DISCHARGE MEDICATIONS:   Allergies as of 09/14/2016   No Known Allergies     Medication List    STOP taking these medications   lisinopril 20 MG tablet Commonly known as:  PRINIVIL,ZESTRIL   simvastatin 40 MG tablet Commonly known as:  ZOCOR   VESICARE 5 MG tablet Generic drug:  solifenacin     TAKE these medications   aspirin 81 MG tablet Take 1 tablet by mouth daily.   atorvastatin 20 MG tablet Commonly known as:  LIPITOR Take 1 tablet (20 mg total) by mouth daily.   BELSOMRA 20 MG Tabs Generic drug:   Suvorexant Take 20 mg by mouth at bedtime as needed.   benzonatate 100 MG capsule Commonly known as:  TESSALON PERLES Take 1 capsule (100 mg total) by mouth every 6 (six) hours as needed for cough.   busPIRone 5 MG tablet Commonly known as:  BUSPAR Take 5 mg by mouth 2 (two) times daily as needed.   ciprofloxacin 500 MG tablet Commonly known as:  CIPRO Take 1 tablet (500 mg total) by mouth 2 (two) times daily.   citalopram 40 MG tablet Commonly known as:  CELEXA Take 1 tablet (40 mg total) by mouth daily.   diltiazem 240 MG 24 hr capsule Commonly known as:  CARDIZEM CD Take 240 mg by mouth daily.   HYDROcodone-homatropine 5-1.5 MG/5ML syrup Commonly known as:  HYCODAN Take 5 mLs by mouth every 6 (six) hours as needed for cough.   levothyroxine 50 MCG tablet Commonly known as:  SYNTHROID, LEVOTHROID Take 50 mcg by mouth every morning.   meloxicam 7.5 MG tablet Commonly known as:  MOBIC Take 7.5 mg by mouth 2 (two) times daily.   metFORMIN 500 MG tablet Commonly known as:  GLUCOPHAGE Take 500 mg by mouth 2 (two) times daily with a meal.   metoprolol tartrate 50 MG tablet Commonly known as:  LOPRESSOR Take 1 tablet (50 mg total) by mouth 2 (two) times daily. What changed:  medication strength  See the new instructions.  Another medication with the same name was  removed. Continue taking this medication, and follow the directions you see here.   metroNIDAZOLE 500 MG tablet Commonly known as:  FLAGYL Take 1 tablet (500 mg total) by mouth 3 (three) times daily.   oxycodone 5 MG capsule Commonly known as:  OXY-IR Take 5 mg by mouth every 4 (four) hours as needed.   promethazine 25 MG tablet Commonly known as:  PHENERGAN Take 25 mg by mouth 2 (two) times daily as needed for nausea or vomiting.   PROTONIX 40 MG tablet Generic drug:  pantoprazole Take 40 mg by mouth 2 (two) times daily.   QUEtiapine 50 MG tablet Commonly known as:  SEROQUEL Take 1 tablet (50  mg total) by mouth 2 (two) times daily.   sucralfate 1 g tablet Commonly known as:  CARAFATE Take 1 g by mouth 4 (four) times daily as needed.   tiZANidine 2 MG tablet Commonly known as:  ZANAFLEX Take 2 mg by mouth every 6 (six) hours as needed for muscle spasms.        DISCHARGE INSTRUCTIONS:   DIET:  Regular diet DISCHARGE CONDITION:  Good ACTIVITY:  Activity as tolerated OXYGEN:  Home Oxygen: No.  Oxygen Delivery: room air DISCHARGE LOCATION:  home   If you experience worsening of your admission symptoms, develop shortness of breath, life threatening emergency, suicidal or homicidal thoughts you must seek medical attention immediately by calling 911 or calling your MD immediately  if symptoms less severe.  You Must read complete instructions/literature along with all the possible adverse reactions/side effects for all the Medicines you take and that have been prescribed to you. Take any new Medicines after you have completely understood and accpet all the possible adverse reactions/side effects.   Please note  You were cared for by a hospitalist during your hospital stay. If you have any questions about your discharge medications or the care you received while you were in the hospital after you are discharged, you can call the unit and asked to speak with the hospitalist on call if the hospitalist that took care of you is not available. Once you are discharged, your primary care physician will handle any further medical issues. Please note that NO REFILLS for any discharge medications will be authorized once you are discharged, as it is imperative that you return to your primary care physician (or establish a relationship with a primary care physician if you do not have one) for your aftercare needs so that they can reassess your need for medications and monitor your lab values.    On the day of Discharge:  VITAL SIGNS:  Blood pressure (!) 149/63, pulse 95, temperature  98.6 F (37 C), temperature source Oral, resp. rate 20, height 5' (1.524 m), weight 80.4 kg (177 lb 4.8 oz), SpO2 98 %. PHYSICAL EXAMINATION:  GENERAL:  71 y.o.-year-old patient lying in the bed with no acute distress.  EYES: Pupils equal, round, reactive to light and accommodation. No scleral icterus. Extraocular muscles intact.  HEENT: Head atraumatic, normocephalic. Oropharynx and nasopharynx clear.  NECK:  Supple, no jugular venous distention. No thyroid enlargement, no tenderness.  LUNGS: Normal breath sounds bilaterally, no wheezing, rales,rhonchi or crepitation. No use of accessory muscles of respiration.  CARDIOVASCULAR: S1, S2 normal. No murmurs, rubs, or gallops.  ABDOMEN: Soft, non-tender, non-distended. Bowel sounds present. No organomegaly or mass.  EXTREMITIES: No pedal edema, cyanosis, or clubbing.  NEUROLOGIC: Cranial nerves II through XII are intact. Muscle strength 5/5 in all extremities. Sensation intact. Gait not  checked.  PSYCHIATRIC: The patient is alert and oriented x 3.  SKIN: No obvious rash, lesion, or ulcer.  DATA REVIEW:   CBC  Recent Labs Lab 09/14/16 0446  WBC 11.1*  HGB 10.9*  HCT 32.6*  PLT 251    Chemistries   Recent Labs Lab 09/12/16 1756  09/14/16 0446  NA 136  < > 141  K 4.9  < > 3.7  CL 103  < > 107  CO2 19*  < > 26  GLUCOSE 260*  < > 136*  BUN 11  < > 7  CREATININE 1.34*  < > 1.00  CALCIUM 9.9  < > 9.0  AST 43*  --   --   ALT 18  --   --   ALKPHOS 57  --   --   BILITOT 0.8  --   --   < > = values in this interval not displayed.    Management plans discussed with the patient, family and they are in agreement.  CODE STATUS: Prior   TOTAL TIME TAKING CARE OF THIS PATIENT: 45 minutes.    Max Sane M.D on 09/15/2016 at 10:55 PM  Between 7am to 6pm - Pager - [phone]  After 6pm go to [email or web address] - password [email or web address] [facility]  [facility]  Office  [phone]  CC: Primary care physician; Allyne Gee, MD   Note: This dictation was prepared with Dragon dictation along with smaller phrase technology. Any transcriptional errors that result from this process are unintentional.

## 2016-09-16 DIAGNOSIS — Z7984 Long term (current) use of oral hypoglycemic drugs: Secondary | ICD-10-CM | POA: Diagnosis not present

## 2016-09-16 DIAGNOSIS — E079 Disorder of thyroid, unspecified: Secondary | ICD-10-CM | POA: Diagnosis not present

## 2016-09-16 DIAGNOSIS — I10 Essential (primary) hypertension: Secondary | ICD-10-CM | POA: Diagnosis not present

## 2016-09-16 DIAGNOSIS — Z9181 History of falling: Secondary | ICD-10-CM | POA: Diagnosis not present

## 2016-09-16 DIAGNOSIS — Z96651 Presence of right artificial knee joint: Secondary | ICD-10-CM | POA: Diagnosis not present

## 2016-09-16 DIAGNOSIS — Z7901 Long term (current) use of anticoagulants: Secondary | ICD-10-CM | POA: Diagnosis not present

## 2016-09-16 DIAGNOSIS — G4730 Sleep apnea, unspecified: Secondary | ICD-10-CM | POA: Diagnosis not present

## 2016-09-16 DIAGNOSIS — M1990 Unspecified osteoarthritis, unspecified site: Secondary | ICD-10-CM | POA: Diagnosis not present

## 2016-09-16 DIAGNOSIS — Z471 Aftercare following joint replacement surgery: Secondary | ICD-10-CM | POA: Diagnosis not present

## 2016-09-16 DIAGNOSIS — E119 Type 2 diabetes mellitus without complications: Secondary | ICD-10-CM | POA: Diagnosis not present

## 2016-09-17 LAB — CULTURE, BLOOD (ROUTINE X 2)
Culture: NO GROWTH
Culture: NO GROWTH
SPECIAL REQUESTS: ADEQUATE
Special Requests: ADEQUATE

## 2016-09-20 DIAGNOSIS — Z7984 Long term (current) use of oral hypoglycemic drugs: Secondary | ICD-10-CM | POA: Diagnosis not present

## 2016-09-20 DIAGNOSIS — E119 Type 2 diabetes mellitus without complications: Secondary | ICD-10-CM | POA: Diagnosis not present

## 2016-09-20 DIAGNOSIS — Z7901 Long term (current) use of anticoagulants: Secondary | ICD-10-CM | POA: Diagnosis not present

## 2016-09-20 DIAGNOSIS — I10 Essential (primary) hypertension: Secondary | ICD-10-CM | POA: Diagnosis not present

## 2016-09-20 DIAGNOSIS — G4730 Sleep apnea, unspecified: Secondary | ICD-10-CM | POA: Diagnosis not present

## 2016-09-20 DIAGNOSIS — M1990 Unspecified osteoarthritis, unspecified site: Secondary | ICD-10-CM | POA: Diagnosis not present

## 2016-09-20 DIAGNOSIS — Z471 Aftercare following joint replacement surgery: Secondary | ICD-10-CM | POA: Diagnosis not present

## 2016-09-20 DIAGNOSIS — E079 Disorder of thyroid, unspecified: Secondary | ICD-10-CM | POA: Diagnosis not present

## 2016-09-20 DIAGNOSIS — Z9181 History of falling: Secondary | ICD-10-CM | POA: Diagnosis not present

## 2016-09-20 DIAGNOSIS — Z96651 Presence of right artificial knee joint: Secondary | ICD-10-CM | POA: Diagnosis not present

## 2016-09-22 DIAGNOSIS — Z7984 Long term (current) use of oral hypoglycemic drugs: Secondary | ICD-10-CM | POA: Diagnosis not present

## 2016-09-22 DIAGNOSIS — Z96651 Presence of right artificial knee joint: Secondary | ICD-10-CM | POA: Diagnosis not present

## 2016-09-22 DIAGNOSIS — Z471 Aftercare following joint replacement surgery: Secondary | ICD-10-CM | POA: Diagnosis not present

## 2016-09-22 DIAGNOSIS — E079 Disorder of thyroid, unspecified: Secondary | ICD-10-CM | POA: Diagnosis not present

## 2016-09-22 DIAGNOSIS — G4730 Sleep apnea, unspecified: Secondary | ICD-10-CM | POA: Diagnosis not present

## 2016-09-22 DIAGNOSIS — I10 Essential (primary) hypertension: Secondary | ICD-10-CM | POA: Diagnosis not present

## 2016-09-22 DIAGNOSIS — Z9181 History of falling: Secondary | ICD-10-CM | POA: Diagnosis not present

## 2016-09-22 DIAGNOSIS — Z7901 Long term (current) use of anticoagulants: Secondary | ICD-10-CM | POA: Diagnosis not present

## 2016-09-22 DIAGNOSIS — M1990 Unspecified osteoarthritis, unspecified site: Secondary | ICD-10-CM | POA: Diagnosis not present

## 2016-09-22 DIAGNOSIS — E119 Type 2 diabetes mellitus without complications: Secondary | ICD-10-CM | POA: Diagnosis not present

## 2016-09-26 DIAGNOSIS — Z7984 Long term (current) use of oral hypoglycemic drugs: Secondary | ICD-10-CM | POA: Diagnosis not present

## 2016-09-26 DIAGNOSIS — G4730 Sleep apnea, unspecified: Secondary | ICD-10-CM | POA: Diagnosis not present

## 2016-09-26 DIAGNOSIS — E119 Type 2 diabetes mellitus without complications: Secondary | ICD-10-CM | POA: Diagnosis not present

## 2016-09-26 DIAGNOSIS — Z96651 Presence of right artificial knee joint: Secondary | ICD-10-CM | POA: Diagnosis not present

## 2016-09-26 DIAGNOSIS — E079 Disorder of thyroid, unspecified: Secondary | ICD-10-CM | POA: Diagnosis not present

## 2016-09-26 DIAGNOSIS — I10 Essential (primary) hypertension: Secondary | ICD-10-CM | POA: Diagnosis not present

## 2016-09-26 DIAGNOSIS — Z471 Aftercare following joint replacement surgery: Secondary | ICD-10-CM | POA: Diagnosis not present

## 2016-09-26 DIAGNOSIS — Z9181 History of falling: Secondary | ICD-10-CM | POA: Diagnosis not present

## 2016-09-26 DIAGNOSIS — M1990 Unspecified osteoarthritis, unspecified site: Secondary | ICD-10-CM | POA: Diagnosis not present

## 2016-09-26 DIAGNOSIS — Z7901 Long term (current) use of anticoagulants: Secondary | ICD-10-CM | POA: Diagnosis not present

## 2016-09-28 DIAGNOSIS — G4730 Sleep apnea, unspecified: Secondary | ICD-10-CM | POA: Diagnosis not present

## 2016-09-28 DIAGNOSIS — E079 Disorder of thyroid, unspecified: Secondary | ICD-10-CM | POA: Diagnosis not present

## 2016-09-28 DIAGNOSIS — I10 Essential (primary) hypertension: Secondary | ICD-10-CM | POA: Diagnosis not present

## 2016-09-28 DIAGNOSIS — E119 Type 2 diabetes mellitus without complications: Secondary | ICD-10-CM | POA: Diagnosis not present

## 2016-09-28 DIAGNOSIS — Z471 Aftercare following joint replacement surgery: Secondary | ICD-10-CM | POA: Diagnosis not present

## 2016-09-28 DIAGNOSIS — M1990 Unspecified osteoarthritis, unspecified site: Secondary | ICD-10-CM | POA: Diagnosis not present

## 2016-09-28 DIAGNOSIS — Z9181 History of falling: Secondary | ICD-10-CM | POA: Diagnosis not present

## 2016-09-28 DIAGNOSIS — Z7984 Long term (current) use of oral hypoglycemic drugs: Secondary | ICD-10-CM | POA: Diagnosis not present

## 2016-09-28 DIAGNOSIS — Z7901 Long term (current) use of anticoagulants: Secondary | ICD-10-CM | POA: Diagnosis not present

## 2016-09-28 DIAGNOSIS — Z96651 Presence of right artificial knee joint: Secondary | ICD-10-CM | POA: Diagnosis not present

## 2016-10-04 DIAGNOSIS — A09 Infectious gastroenteritis and colitis, unspecified: Secondary | ICD-10-CM | POA: Diagnosis not present

## 2016-10-04 DIAGNOSIS — E1165 Type 2 diabetes mellitus with hyperglycemia: Secondary | ICD-10-CM | POA: Diagnosis not present

## 2016-10-04 DIAGNOSIS — E782 Mixed hyperlipidemia: Secondary | ICD-10-CM | POA: Diagnosis not present

## 2016-10-04 DIAGNOSIS — I10 Essential (primary) hypertension: Secondary | ICD-10-CM | POA: Diagnosis not present

## 2016-10-06 DIAGNOSIS — Z96651 Presence of right artificial knee joint: Secondary | ICD-10-CM | POA: Diagnosis not present

## 2016-10-06 DIAGNOSIS — M25561 Pain in right knee: Secondary | ICD-10-CM | POA: Diagnosis not present

## 2016-10-13 DIAGNOSIS — Z96651 Presence of right artificial knee joint: Secondary | ICD-10-CM | POA: Diagnosis not present

## 2016-10-13 DIAGNOSIS — M25561 Pain in right knee: Secondary | ICD-10-CM | POA: Diagnosis not present

## 2016-10-17 DIAGNOSIS — Z96651 Presence of right artificial knee joint: Secondary | ICD-10-CM | POA: Diagnosis not present

## 2016-10-17 DIAGNOSIS — M25561 Pain in right knee: Secondary | ICD-10-CM | POA: Diagnosis not present

## 2016-10-25 DIAGNOSIS — M25561 Pain in right knee: Secondary | ICD-10-CM | POA: Diagnosis not present

## 2016-10-25 DIAGNOSIS — D72829 Elevated white blood cell count, unspecified: Secondary | ICD-10-CM | POA: Diagnosis not present

## 2016-10-25 DIAGNOSIS — Z96651 Presence of right artificial knee joint: Secondary | ICD-10-CM | POA: Diagnosis not present

## 2016-10-28 DIAGNOSIS — M25561 Pain in right knee: Secondary | ICD-10-CM | POA: Diagnosis not present

## 2016-10-28 DIAGNOSIS — Z96651 Presence of right artificial knee joint: Secondary | ICD-10-CM | POA: Diagnosis not present

## 2016-11-02 DIAGNOSIS — E860 Dehydration: Secondary | ICD-10-CM | POA: Diagnosis not present

## 2016-11-02 DIAGNOSIS — E1165 Type 2 diabetes mellitus with hyperglycemia: Secondary | ICD-10-CM | POA: Diagnosis not present

## 2016-11-02 DIAGNOSIS — I10 Essential (primary) hypertension: Secondary | ICD-10-CM | POA: Diagnosis not present

## 2016-11-02 DIAGNOSIS — Z23 Encounter for immunization: Secondary | ICD-10-CM | POA: Diagnosis not present

## 2016-11-02 DIAGNOSIS — E782 Mixed hyperlipidemia: Secondary | ICD-10-CM | POA: Diagnosis not present

## 2016-11-04 DIAGNOSIS — Z96651 Presence of right artificial knee joint: Secondary | ICD-10-CM | POA: Diagnosis not present

## 2016-11-04 DIAGNOSIS — M25561 Pain in right knee: Secondary | ICD-10-CM | POA: Diagnosis not present

## 2016-12-16 DIAGNOSIS — E1165 Type 2 diabetes mellitus with hyperglycemia: Secondary | ICD-10-CM | POA: Diagnosis not present

## 2016-12-16 DIAGNOSIS — E782 Mixed hyperlipidemia: Secondary | ICD-10-CM | POA: Diagnosis not present

## 2016-12-16 DIAGNOSIS — Z0001 Encounter for general adult medical examination with abnormal findings: Secondary | ICD-10-CM | POA: Diagnosis not present

## 2016-12-16 DIAGNOSIS — G4700 Insomnia, unspecified: Secondary | ICD-10-CM | POA: Diagnosis not present

## 2016-12-16 DIAGNOSIS — I10 Essential (primary) hypertension: Secondary | ICD-10-CM | POA: Diagnosis not present

## 2017-02-07 HISTORY — PX: JOINT REPLACEMENT: SHX530

## 2017-02-20 ENCOUNTER — Other Ambulatory Visit: Payer: Self-pay

## 2017-02-20 MED ORDER — METFORMIN HCL 500 MG PO TABS: 500.0000 mg | ORAL_TABLET | Freq: Two times a day (BID) | ORAL | 3 refills | Status: DC

## 2017-03-02 ENCOUNTER — Other Ambulatory Visit: Payer: Self-pay | Admitting: Nurse Practitioner

## 2017-03-02 DIAGNOSIS — J069 Acute upper respiratory infection, unspecified: Secondary | ICD-10-CM

## 2017-03-02 DIAGNOSIS — J22 Unspecified acute lower respiratory infection: Secondary | ICD-10-CM | POA: Diagnosis not present

## 2017-03-02 MED ORDER — AZITHROMYCIN 250 MG PO TABS: ORAL_TABLET | ORAL | 0 refills | Status: DC

## 2017-03-02 NOTE — Progress Notes (Signed)
Sent in z-pack. Take as directed for 5 days. Rest and increase fluids. OTC medications for aches and fever.

## 2017-03-04 ENCOUNTER — Emergency Department
Admission: EM | Admit: 2017-03-04 | Discharge: 2017-03-04 | Disposition: A | Discharge status: Home / Self Care | Payer: Medicare Other | Attending: Emergency Medicine | Admitting: Emergency Medicine

## 2017-03-04 ENCOUNTER — Emergency Department: Payer: Medicare Other

## 2017-03-04 ENCOUNTER — Other Ambulatory Visit: Payer: Self-pay

## 2017-03-04 DIAGNOSIS — I10 Essential (primary) hypertension: Secondary | ICD-10-CM | POA: Insufficient documentation

## 2017-03-04 DIAGNOSIS — E119 Type 2 diabetes mellitus without complications: Secondary | ICD-10-CM | POA: Diagnosis not present

## 2017-03-04 DIAGNOSIS — Z7984 Long term (current) use of oral hypoglycemic drugs: Secondary | ICD-10-CM | POA: Insufficient documentation

## 2017-03-04 DIAGNOSIS — Z7982 Long term (current) use of aspirin: Secondary | ICD-10-CM | POA: Diagnosis not present

## 2017-03-04 DIAGNOSIS — Z79899 Other long term (current) drug therapy: Secondary | ICD-10-CM | POA: Insufficient documentation

## 2017-03-04 DIAGNOSIS — J40 Bronchitis, not specified as acute or chronic: Secondary | ICD-10-CM | POA: Diagnosis not present

## 2017-03-04 DIAGNOSIS — R062 Wheezing: Secondary | ICD-10-CM | POA: Diagnosis not present

## 2017-03-04 DIAGNOSIS — M791 Myalgia, unspecified site: Secondary | ICD-10-CM | POA: Diagnosis not present

## 2017-03-04 DIAGNOSIS — R05 Cough: Secondary | ICD-10-CM | POA: Diagnosis not present

## 2017-03-04 DIAGNOSIS — R0981 Nasal congestion: Secondary | ICD-10-CM | POA: Diagnosis not present

## 2017-03-04 MED ORDER — HYDROCOD POLST-CPM POLST ER 10-8 MG/5ML PO SUER
5.0000 mL | Freq: Once | ORAL | Status: AC
  Administered 2017-03-04: 5 mL via ORAL
  Filled 2017-03-04: qty 5

## 2017-03-04 MED ORDER — PREDNISONE 20 MG PO TABS: 40.0000 mg | ORAL_TABLET | Freq: Every day | ORAL | 0 refills | Status: DC

## 2017-03-04 MED ORDER — PREDNISONE 20 MG PO TABS
60.0000 mg | ORAL_TABLET | Freq: Once | ORAL | Status: AC
  Administered 2017-03-04: 60 mg via ORAL
  Filled 2017-03-04: qty 3

## 2017-03-04 MED ORDER — HYDROCOD POLST-CPM POLST ER 10-8 MG/5ML PO SUER: 5.0000 mL | Freq: Every evening | ORAL | 0 refills | Status: DC | PRN

## 2017-03-04 NOTE — ED Provider Notes (Signed)
[facility] EMERGENCY DEPARTMENT Provider Note   CSN: [id number] Arrival date & time: 03/04/17  1912     History   Chief Complaint Chief Complaint  Patient presents with  . Cough    HPI [name] is a 72 y.o. female presents to the emergency department for evaluation of cough.  Symptoms have been present for 7 days.  Patient describes dry cough, congestion runny nose and mild sore throat.  She has been taking guaifenesin, Tessalon Perles and amoxicillin that was prescribed urgent care facility 4 days ago.  Patient unsure of her diagnosis, patient denies any sinus pain, pressure.  She is tolerating p.o. well.  She has had low-grade fever of 99.  She has episodes of shortness of breath, she describes wheezing, has not been using her home nebulizers.  Patient denies any chest pain.  HPI  Past Medical History:  Diagnosis Date  . Anxiety   . Depression   . Diabetes mellitus, type II ([place])   . GERD (gastroesophageal reflux disease)   . Hypertension   . Thyroid disease     Patient Active Problem List   Diagnosis Date Noted  . Acute ischemic colitis ([place]) 09/12/2016  . Nausea and vomiting 09/12/2016  . Acute urinary retention 09/12/2016  . Acute renal insufficiency 09/12/2016  . Colitis 09/12/2016  . Depression, major, recurrent, moderate ([place]) 07/22/2014  . Anxiety, generalized 07/22/2014  . Insomnia, persistent 07/22/2014  . Panic disorder without agoraphobia 07/22/2014  . H/O: obesity 06/01/2014  . H/O: HTN (hypertension) 06/01/2014  . H/O gastrointestinal disease 06/01/2014  . H/O diabetes mellitus 06/01/2014  . H/O elevated lipids 06/01/2014  . History of hay fever 06/01/2014    Past Surgical History:  Procedure Laterality Date  . ABDOMINAL HYSTERECTOMY    . BREAST BIOPSY      OB History    No data available       Home Medications    Prior to Admission medications   Medication Sig Start Date End Date Taking? Authorizing  Provider  aspirin 81 MG tablet Take 1 tablet by mouth daily.    [provider]  atorvastatin (LIPITOR) 20 MG tablet Take 1 tablet (20 mg total) by mouth daily. 09/14/16 09/14/17  Max Sane, MD  azithromycin (ZITHROMAX) 250 MG tablet z-pack - take as directed for 5 days 03/02/17   Ronnell Freshwater, NP  BELSOMRA 20 MG TABS Take 20 mg by mouth at bedtime as needed. 12/24/14   Marjie Skiff, MD  busPIRone (BUSPAR) 5 MG tablet Take 5 mg by mouth 2 (two) times daily as needed.    [provider]  chlorpheniramine-HYDROcodone (TUSSIONEX PENNKINETIC ER) 10-8 MG/5ML SUER Take 5 mLs by mouth at bedtime as needed for cough. 03/04/17   Duanne Guess, PA-C  citalopram (CELEXA) 40 MG tablet Take 1 tablet (40 mg total) by mouth daily. 01/13/16   Elvin So, MD  diltiazem (CARDIZEM CD) 240 MG 24 hr capsule Take 240 mg by mouth daily. 06/30/14   [provider]  HYDROcodone-homatropine (HYCODAN) 5-1.5 MG/5ML syrup Take 5 mLs by mouth every 6 (six) hours as needed for cough. Patient not taking: Reported on 09/12/2016 03/13/15   Mortimer Fries, PA-C  levothyroxine (SYNTHROID, LEVOTHROID) 50 MCG tablet Take 50 mcg by mouth every morning. 07/07/14   [provider]  meloxicam (MOBIC) 7.5 MG tablet Take 7.5 mg by mouth 2 (two) times daily.    [provider]  metFORMIN (GLUCOPHAGE) 500 MG tablet Take  1 tablet (500 mg total) by mouth 2 (two) times daily with a meal. 02/20/17   Boscia, Greer Ee, NP  metoprolol tartrate (LOPRESSOR) 50 MG tablet Take 1 tablet (50 mg total) by mouth 2 (two) times daily. 09/14/16   Max Sane, MD  oxycodone (OXY-IR) 5 MG capsule Take 5 mg by mouth every 4 (four) hours as needed.    [provider]  pantoprazole (PROTONIX) 40 MG tablet Take 40 mg by mouth 2 (two) times daily.     [provider]  predniSONE (DELTASONE) 20 MG tablet Take 2 tablets (40 mg total) by mouth daily. 03/04/17   Duanne Guess, PA-C  promethazine  (PHENERGAN) 25 MG tablet Take 25 mg by mouth 2 (two) times daily as needed for nausea or vomiting.    [provider]  QUEtiapine (SEROQUEL) 50 MG tablet Take 1 tablet (50 mg total) by mouth 2 (two) times daily. 01/13/16   Elvin So, MD  sucralfate (CARAFATE) 1 G tablet Take 1 g by mouth 4 (four) times daily as needed.     [provider]  tiZANidine (ZANAFLEX) 2 MG tablet Take 2 mg by mouth every 6 (six) hours as needed for muscle spasms.    [provider]    Family History Family History  Problem Relation Age of Onset  . Anxiety disorder Mother   . Colon cancer Mother   . Heart disease Father   . Anxiety disorder Father   . Obesity Sister   . Anxiety disorder Sister   . Depression Sister   . Osteoporosis Sister     Social History Social History   Tobacco Use  . Smoking status: Never Smoker  . Smokeless tobacco: Never Used  Substance Use Topics  . Alcohol use: No    Alcohol/week: 0.0 oz  . Drug use: No     Allergies   Patient has no known allergies.   Review of Systems Review of Systems  Constitutional: Negative for fever.  HENT: Positive for congestion, rhinorrhea and sore throat. Negative for ear discharge, sinus pressure, sinus pain, trouble swallowing and voice change.   Respiratory: Positive for cough and wheezing. Negative for shortness of breath and stridor.   Cardiovascular: Negative for chest pain.  Gastrointestinal: Negative for abdominal pain, diarrhea, nausea and vomiting.  Genitourinary: Negative for dysuria, flank pain and pelvic pain.  Musculoskeletal: Positive for myalgias. Negative for back pain.  Skin: Negative for rash.  Neurological: Negative for dizziness and headaches.     Physical Exam Updated Vital Signs BP (!) 153/69 (BP Location: Left Arm)   Pulse 93   Temp 99 F (37.2 C) (Oral)   Resp 18   Ht 5' (1.524 m)   Wt 82.6 kg (182 lb)   SpO2 98%   BMI 35.54 kg/m   Physical Exam  Constitutional: She is  oriented to person, place, and time. She appears well-developed and well-nourished. No distress.  HENT:  Head: Normocephalic and atraumatic.  Right Ear: Hearing, tympanic membrane, external ear and ear canal normal.  Left Ear: Hearing, tympanic membrane, external ear and ear canal normal.  Nose: Rhinorrhea present.  Mouth/Throat: Mucous membranes are normal. No trismus in the jaw. No uvula swelling. Posterior oropharyngeal erythema present. No oropharyngeal exudate, posterior oropharyngeal edema or tonsillar abscesses. No tonsillar exudate.  No pharyngeal exudates, swelling.  Eyes: Conjunctivae are normal. Right eye exhibits no discharge. Left eye exhibits no discharge.  Neck: Normal range of motion.  Cardiovascular: Normal rate and regular rhythm.  Pulmonary/Chest: Effort normal. No stridor. No respiratory distress. She has wheezes. She has no rales.  Very minimal expiratory wheeze  Abdominal: Soft. She exhibits no distension. There is no tenderness.  Musculoskeletal: Normal range of motion. She exhibits no deformity.  Lymphadenopathy:    She has cervical adenopathy.  Neurological: She is alert and oriented to person, place, and time. She has normal reflexes.  Skin: Skin is warm and dry.  Psychiatric: She has a normal mood and affect. Her behavior is normal. Thought content normal.     ED Treatments / Results  Labs (all labs ordered are listed, but only abnormal results are displayed) Labs Reviewed - No data to display  EKG  EKG Interpretation None       Radiology Dg Chest 2 View  Result Date: 03/04/2017 CLINICAL DATA:  Productive cough EXAM: CHEST  2 VIEW COMPARISON:  05/04/2016 chest radiograph. FINDINGS: Stable cardiomediastinal silhouette with normal heart size and large hiatal hernia with air-fluid level. No pneumothorax. No pleural effusion. Lungs appear clear, with no acute consolidative airspace disease and no pulmonary edema. IMPRESSION: No active cardiopulmonary  disease.  Stable large hiatal hernia. Electronically Signed   By: Ilona Sorrel M.D.   On: 03/04/2017 19:55    Procedures Procedures (including critical care time)  Medications Ordered in ED Medications  predniSONE (DELTASONE) tablet 60 mg (not administered)  chlorpheniramine-HYDROcodone (TUSSIONEX) 10-8 MG/5ML suspension 5 mL (not administered)     Initial Impression / Assessment and Plan / ED Course  I have reviewed the triage vital signs and the nursing notes.  Pertinent labs & imaging results that were available during my care of the patient were reviewed by me and considered in my medical decision making (see chart for details).     72 year old female with cough times 7 days.  She is with slight expiratory wheeze on exam.  Vital signs are normal.  Nighttime cough is moderate, nonproductive.  No relief with Tessalon Perles.  Patient will start prednisone 40 mg daily for 5 days and she is encouraged to start using her albuterol nebulizers every 6 hours as needed.  She will take Tussionex at nighttime for cough.  She is educated on signs and symptoms to return to the ED for.  Final Clinical Impressions(s) / ED Diagnoses   Final diagnoses:  Bronchitis    ED Discharge Orders        Ordered    chlorpheniramine-HYDROcodone (TUSSIONEX PENNKINETIC ER) 10-8 MG/5ML SUER  At bedtime PRN     03/04/17 2029    predniSONE (DELTASONE) 20 MG tablet  Daily     03/04/17 2029       Renata Caprice 03/04/17 2039    Lavonia Drafts, MD 03/04/17 2308

## 2017-03-04 NOTE — ED Notes (Signed)
PT states seen at UC approx 3 days ago, was given script for Amoxicillin and Tessalon. Pt reports continued cough and sore throat at this time. Pt reports painful swallowing. Pt with strong cough noted at time of assessment and green sputum, pt reports small speck of blood in one sputum productions that she coughed up.

## 2017-03-04 NOTE — Discharge Instructions (Signed)
Please take prednisone as prescribed.  Please use your home nebulizers every 6 hours as needed.  You may take Tessalon Perles during the day and Tussionex cough syrup at nighttime before going to bed.  Monitor for any fevers.  If any fevers, worsening cough, shortness of breath, return to the emergency department for further evaluation.

## 2017-03-04 NOTE — ED Triage Notes (Addendum)
Pt arrives to ED via POV from home with c/o cough x1 week. Pt seen at Urgent care 3 days ago, r/x'd Amoxicillin and Tessalon pearls. Pt reports taking meds as prescribed w/o relief. Pt reports productive cough with yellow/green sputum. Pt denies N/V/D or fever, no CP or SHOB. Pt reports she has gotten her flu shot for this yr. Pt RR even, regular, and unlabored; skin color/temp is WNL.

## 2017-03-04 NOTE — ED Notes (Signed)
NAD noted at time of D/C. Pt denies questions or concerns. Pt ambulatory to the lobby at this time.  

## 2017-03-16 ENCOUNTER — Other Ambulatory Visit: Payer: Self-pay

## 2017-03-16 DIAGNOSIS — J40 Bronchitis, not specified as acute or chronic: Secondary | ICD-10-CM | POA: Diagnosis not present

## 2017-03-16 MED ORDER — ALBUTEROL SULFATE (2.5 MG/3ML) 0.083% IN NEBU: 2.5000 mg | INHALATION_SOLUTION | Freq: Four times a day (QID) | RESPIRATORY_TRACT | 5 refills | Status: DC | PRN

## 2017-03-24 ENCOUNTER — Other Ambulatory Visit: Payer: Self-pay

## 2017-03-24 MED ORDER — BUSPIRONE HCL 10 MG PO TABS: 10.0000 mg | ORAL_TABLET | Freq: Two times a day (BID) | ORAL | 1 refills | Status: DC

## 2017-03-27 ENCOUNTER — Ambulatory Visit: Payer: Self-pay | Admitting: Nurse Practitioner

## 2017-04-05 ENCOUNTER — Other Ambulatory Visit: Payer: Self-pay

## 2017-04-05 MED ORDER — LEVOTHYROXINE SODIUM 50 MCG PO TABS: 50.0000 ug | ORAL_TABLET | Freq: Every morning | ORAL | 3 refills | Status: DC

## 2017-04-05 MED ORDER — ATORVASTATIN CALCIUM 20 MG PO TABS: 20.0000 mg | ORAL_TABLET | Freq: Every day | ORAL | 3 refills | Status: DC

## 2017-04-07 DIAGNOSIS — E669 Obesity, unspecified: Secondary | ICD-10-CM | POA: Insufficient documentation

## 2017-04-07 DIAGNOSIS — G8929 Other chronic pain: Secondary | ICD-10-CM | POA: Diagnosis not present

## 2017-04-07 DIAGNOSIS — S22080A Wedge compression fracture of T11-T12 vertebra, initial encounter for closed fracture: Secondary | ICD-10-CM | POA: Diagnosis not present

## 2017-04-07 DIAGNOSIS — M545 Low back pain: Secondary | ICD-10-CM | POA: Diagnosis not present

## 2017-04-07 DIAGNOSIS — Z6835 Body mass index (BMI) 35.0-35.9, adult: Secondary | ICD-10-CM | POA: Insufficient documentation

## 2017-04-10 ENCOUNTER — Ambulatory Visit: Payer: Self-pay | Admitting: Nurse Practitioner

## 2017-04-11 ENCOUNTER — Other Ambulatory Visit: Payer: Self-pay

## 2017-04-11 MED ORDER — QUETIAPINE FUMARATE 50 MG PO TABS: 50.0000 mg | ORAL_TABLET | Freq: Two times a day (BID) | ORAL | 1 refills | Status: DC

## 2017-05-04 ENCOUNTER — Ambulatory Visit (INDEPENDENT_AMBULATORY_CARE_PROVIDER_SITE_OTHER): Payer: Medicare Other | Admitting: Internal Medicine

## 2017-05-04 ENCOUNTER — Encounter: Payer: Self-pay | Admitting: Internal Medicine

## 2017-05-04 VITALS — BP 137/71 | HR 57 | Resp 16 | Ht 60.0 in | Wt 178.2 lb

## 2017-05-04 DIAGNOSIS — G479 Sleep disorder, unspecified: Secondary | ICD-10-CM | POA: Diagnosis not present

## 2017-05-04 DIAGNOSIS — R5383 Other fatigue: Secondary | ICD-10-CM

## 2017-05-04 DIAGNOSIS — I10 Essential (primary) hypertension: Secondary | ICD-10-CM

## 2017-05-04 DIAGNOSIS — E782 Mixed hyperlipidemia: Secondary | ICD-10-CM

## 2017-05-04 DIAGNOSIS — E039 Hypothyroidism, unspecified: Secondary | ICD-10-CM | POA: Diagnosis not present

## 2017-05-04 DIAGNOSIS — F322 Major depressive disorder, single episode, severe without psychotic features: Secondary | ICD-10-CM

## 2017-05-04 DIAGNOSIS — E1165 Type 2 diabetes mellitus with hyperglycemia: Secondary | ICD-10-CM | POA: Diagnosis not present

## 2017-05-04 LAB — POCT GLYCOSYLATED HEMOGLOBIN (HGB A1C): Hemoglobin A1C: 6.8

## 2017-05-04 MED ORDER — ZOLPIDEM TARTRATE 10 MG PO TABS: 10.0000 mg | ORAL_TABLET | Freq: Every evening | ORAL | 3 refills | Status: DC | PRN

## 2017-05-04 MED ORDER — LISINOPRIL 20 MG PO TABS: 20.0000 mg | ORAL_TABLET | Freq: Every day | ORAL | 3 refills | Status: DC

## 2017-05-04 MED ORDER — QUETIAPINE FUMARATE 50 MG PO TABS: 50.0000 mg | ORAL_TABLET | Freq: Every day | ORAL | 3 refills | Status: DC

## 2017-05-04 MED ORDER — DILTIAZEM HCL ER COATED BEADS 240 MG PO CP24: 240.0000 mg | ORAL_CAPSULE | Freq: Every day | ORAL | 3 refills | Status: DC

## 2017-05-04 NOTE — Progress Notes (Signed)
[facility] [place], [place] [address]  Internal MEDICINE  Office Visit Note  Patient Name: [name]  [id number]  [id number]  Date of Service: 05/18/2017  Chief Complaint  Patient presents with  . Annual Exam  . Depression  . Diabetes  . Hypertension  . Insomnia  Pt is here for routine follow up.   Diabetes  She presents for her follow-up diabetic visit. She has type 2 diabetes mellitus. Her disease course has been improving. There are no hypoglycemic associated symptoms. Pertinent negatives for hypoglycemia include no nervousness/anxiousness or tremors. Associated symptoms include fatigue and weakness. Pertinent negatives for diabetes include no chest pain. There are no hypoglycemic complications. Symptoms are improving. Risk factors for coronary artery disease include sedentary lifestyle, obesity, hypertension, dyslipidemia and diabetes mellitus. She is compliant with treatment all of the time. Her weight is stable. She has not had a previous visit with a dietitian. She rarely participates in exercise. Her home blood glucose trend is decreasing steadily. Her breakfast blood glucose range is generally 110-130 mg/dl. Her bedtime blood glucose is taken between 10-11 pm. Her bedtime blood glucose range is generally 140-180 mg/dl. An ACE inhibitor/angiotensin II receptor blocker is being taken. She sees a podiatrist.Eye exam is current.  Hypertension  This is a chronic problem. The problem has been rapidly improving since onset. The problem is controlled. Associated symptoms include malaise/fatigue. Pertinent negatives include no chest pain, neck pain, palpitations or shortness of breath. Past treatments include ACE inhibitors and calcium channel blockers. The current treatment provides significant improvement. There are no compliance problems.   Insomnia  Primary symptoms: fragmented sleep, no sleep disturbance, somnolence, malaise/fatigue, napping.  The  current episode started more than one year. The onset quality is undetermined. The problem occurs every several days. The problem has been rapidly worsening since onset. PMH includes: depression.  Depression       (Tired during the day)  This is a chronic problem.  The current episode started more than 1 year ago.   The onset quality is undetermined.   Associated symptoms include fatigue, insomnia, irritable and sad.  Associated symptoms include no suicidal ideas.   Current Medication: Outpatient Encounter Medications as of 05/04/2017  Medication Sig Note  . albuterol (PROVENTIL) (2.5 MG/3ML) 0.083% nebulizer solution Take 3 mLs (2.5 mg total) by nebulization every 6 (six) hours as needed for wheezing or shortness of breath.   Marland Kitchen aspirin 81 MG tablet Take 1 tablet by mouth daily.   Marland Kitchen atorvastatin (LIPITOR) 20 MG tablet Take 1 tablet (20 mg total) by mouth daily.   . busPIRone (BUSPAR) 10 MG tablet Take 1 tablet (10 mg total) by mouth 2 (two) times daily. Take 1/2 to 1 tablet by mouth twice daily.   . citalopram (CELEXA) 40 MG tablet Take 1 tablet (40 mg total) by mouth daily.   Marland Kitchen diltiazem (CARDIZEM CD) 240 MG 24 hr capsule Take 1 capsule (240 mg total) by mouth daily.   Marland Kitchen levothyroxine (SYNTHROID, LEVOTHROID) 50 MCG tablet Take 1 tablet (50 mcg total) by mouth every morning.   Marland Kitchen lisinopril (PRINIVIL,ZESTRIL) 20 MG tablet Take 1 tablet (20 mg total) by mouth daily.   . meloxicam (MOBIC) 7.5 MG tablet Take 7.5 mg by mouth 2 (two) times daily. 09/12/2016: Pt has not started yet. New script  . metFORMIN (GLUCOPHAGE) 500 MG tablet Take 1 tablet (500 mg total) by mouth 2 (two) times daily with a meal.   . metoprolol tartrate (LOPRESSOR)  50 MG tablet Take 1 tablet (50 mg total) by mouth 2 (two) times daily.   Marland Kitchen oxycodone (OXY-IR) 5 MG capsule Take 5 mg by mouth every 4 (four) hours as needed.   . pantoprazole (PROTONIX) 40 MG tablet Take 40 mg by mouth 2 (two) times daily.    . predniSONE (DELTASONE) 20  MG tablet Take 2 tablets (40 mg total) by mouth daily.   . promethazine (PHENERGAN) 25 MG tablet Take 25 mg by mouth 2 (two) times daily as needed for nausea or vomiting.   Marland Kitchen QUEtiapine (SEROQUEL) 50 MG tablet Take 1 tablet (50 mg total) by mouth at bedtime.   . sucralfate (CARAFATE) 1 G tablet Take 1 g by mouth 4 (four) times daily as needed.    Marland Kitchen tiZANidine (ZANAFLEX) 2 MG tablet Take 2 mg by mouth every 6 (six) hours as needed for muscle spasms.   Marland Kitchen zolpidem (AMBIEN) 10 MG tablet Take 1 tablet (10 mg total) by mouth at bedtime as needed for sleep.   . [DISCONTINUED] azithromycin (ZITHROMAX) 250 MG tablet z-pack - take as directed for 5 days   . [DISCONTINUED] BELSOMRA 20 MG TABS Take 20 mg by mouth at bedtime as needed.   . [DISCONTINUED] chlorpheniramine-HYDROcodone (TUSSIONEX PENNKINETIC ER) 10-8 MG/5ML SUER Take 5 mLs by mouth at bedtime as needed for cough.   . [DISCONTINUED] diltiazem (CARDIZEM CD) 240 MG 24 hr capsule Take 240 mg by mouth daily. 07/22/2014: Received from: External Pharmacy  . [DISCONTINUED] HYDROcodone-homatropine (HYCODAN) 5-1.5 MG/5ML syrup Take 5 mLs by mouth every 6 (six) hours as needed for cough. (Patient not taking: Reported on 09/12/2016)   . [DISCONTINUED] QUEtiapine (SEROQUEL) 50 MG tablet Take 1 tablet (50 mg total) by mouth 2 (two) times daily.    No facility-administered encounter medications on file as of 05/04/2017.     Surgical History: Past Surgical History:  Procedure Laterality Date  . ABDOMINAL HYSTERECTOMY    . BREAST BIOPSY      Medical History: Past Medical History:  Diagnosis Date  . Anxiety   . Depression   . Diabetes mellitus, type II ([place])   . GERD (gastroesophageal reflux disease)   . Hypertension   . Thyroid disease     Family History: Family History  Problem Relation Age of Onset  . Anxiety disorder Mother   . Colon cancer Mother   . Heart disease Father   . Anxiety disorder Father   . Obesity Sister   . Anxiety disorder  Sister   . Depression Sister   . Osteoporosis Sister     Social History   Socioeconomic History  . Marital status: Widowed    Spouse name: Not on file  . Number of children: Not on file  . Years of education: Not on file  . Highest education level: Not on file  Occupational History  . Not on file  Social Needs  . Financial resource strain: Not on file  . Food insecurity:    Worry: Not on file    Inability: Not on file  . Transportation needs:    Medical: Not on file    Non-medical: Not on file  Tobacco Use  . Smoking status: Never Smoker  . Smokeless tobacco: Never Used  Substance and Sexual Activity  . Alcohol use: No    Alcohol/week: 0.0 oz  . Drug use: No  . Sexual activity: Never  Lifestyle  . Physical activity:    Days per week: Not on file  Minutes per session: Not on file  . Stress: Not on file  Relationships  . Social connections:    Talks on phone: Not on file    Gets together: Not on file    Attends religious service: Not on file    Active member of club or organization: Not on file    Attends meetings of clubs or organizations: Not on file    Relationship status: Not on file  . Intimate partner violence:    Fear of current or ex partner: Not on file    Emotionally abused: Not on file    Physically abused: Not on file    Forced sexual activity: Not on file  Other Topics Concern  . Not on file  Social History Narrative  . Not on file    Review of Systems  Constitutional: Positive for fatigue and malaise/fatigue. Negative for chills and unexpected weight change.  HENT: Positive for postnasal drip. Negative for congestion, rhinorrhea, sneezing and sore throat.   Eyes: Negative for redness.  Respiratory: Negative for cough, chest tightness and shortness of breath.   Cardiovascular: Negative for chest pain and palpitations.  Gastrointestinal: Negative for abdominal pain, constipation, diarrhea, nausea and vomiting.  Genitourinary: Negative for  dysuria and frequency.  Musculoskeletal: Negative for arthralgias, back pain, joint swelling and neck pain.  Skin: Negative for rash.  Neurological: Positive for weakness. Negative for tremors and numbness.  Hematological: Negative for adenopathy. Does not bruise/bleed easily.  Psychiatric/Behavioral: Positive for depression. Negative for behavioral problems (Depression), sleep disturbance and suicidal ideas. The patient has insomnia. The patient is not nervous/anxious.     Vital Signs: BP 137/71   Pulse (!) 57   Resp 16   Ht 5' (1.524 m)   Wt 178 lb 3.2 oz (80.8 kg)   HC 16" (40.6 cm)   SpO2 96%   BMI 34.80 kg/m    Physical Exam  Constitutional: She is oriented to person, place, and time. She appears well-developed and well-nourished. She is irritable. No distress.  HENT:  Head: Normocephalic and atraumatic.  Mouth/Throat: Oropharynx is clear and moist. No oropharyngeal exudate.  Eyes: Pupils are equal, round, and reactive to light. EOM are normal.  Neck: Normal range of motion. Neck supple. No JVD present. No tracheal deviation present. No thyromegaly present.  Cardiovascular: Normal rate, regular rhythm and normal heart sounds. Exam reveals no gallop and no friction rub.  No murmur heard. Pulmonary/Chest: Effort normal. No respiratory distress. She has no wheezes. She has no rales. She exhibits no tenderness.  Abdominal: Soft. Bowel sounds are normal.  Musculoskeletal: Normal range of motion.  Lymphadenopathy:    She has no cervical adenopathy.  Neurological: She is alert and oriented to person, place, and time. No cranial nerve deficit.  Skin: Skin is warm and dry. She is not diaphoretic.  Psychiatric: She has a normal mood and affect. Her behavior is normal. Judgment and thought content normal.   Assessment/Plan: 1. Uncontrolled type 2 diabetes mellitus with hyperglycemia (HCC) - Controlled  - POCT HgB A1C  2. Mixed hyperlipidemia - Lipid Panel With LDL/HDL Ratio,  continue Lipitor   3. Essential hypertension, benign - lisinopril (PRINIVIL,ZESTRIL) 20 MG tablet; Take 1 tablet (20 mg total) by mouth daily.  Dispense: 90 tablet; Refill: 3 - diltiazem (CARDIZEM CD) 240 MG 24 hr capsule; Take 1 capsule (240 mg total) by mouth daily.  Dispense: 90 capsule; Refill: 3 - Comprehensive metabolic panel - Urinalysis  4. Depression, major, single episode, severe ([place]) -  DC am dose of Seroquel  - QUEtiapine (SEROQUEL) 50 MG tablet; Take 1 tablet (50 mg total) by mouth at bedtime.  Dispense: 90 tablet; Refill: 3 - CBC with Differential/Platelet - B12 and Folate Panel - Fe+TIBC+Fer  5. Hypothyroidism, unspecified type - TSH - T4, free  6. Fatigue due to sleep pattern disturbance - Home sleep study is ordered  - zolpidem (AMBIEN) 10 MG tablet; Take 1 tablet (10 mg total) by mouth at bedtime as needed for sleep.  Dispense: 30 tablet; Refill: 3  General Counseling: [name] verbalizes understanding of the findings of todays visit and agrees with plan of treatment. I have discussed any further diagnostic evaluation that may be needed or ordered today. We also reviewed her medications today. she has been encouraged to call the office with any questions or concerns that should arise related to todays visit. Diabetes Counseling:  1. Addition of ACE inh/ ARB'S for nephroprotection. 2. Diabetic foot care, prevention of complications.  3.Exercise and lose weight.  4. Diabetic eye examination, 5. Monitor blood sugar closlely. nutrition counseling.  6.Sign and symptoms of hypoglycemia including shaking sweating,confusion and headaches.   Orders Placed This Encounter  Procedures  . CBC with Differential/Platelet  . Lipid Panel With LDL/HDL Ratio  . TSH  . T4, free  . Comprehensive metabolic panel  . Urinalysis  . B12 and Folate Panel  . Fe+TIBC+Fer  . POCT HgB A1C    Meds ordered this encounter  Medications  . zolpidem (AMBIEN) 10 MG tablet    Sig: Take 1  tablet (10 mg total) by mouth at bedtime as needed for sleep.    Dispense:  30 tablet    Refill:  3  . QUEtiapine (SEROQUEL) 50 MG tablet    Sig: Take 1 tablet (50 mg total) by mouth at bedtime.    Dispense:  90 tablet    Refill:  3  . lisinopril (PRINIVIL,ZESTRIL) 20 MG tablet    Sig: Take 1 tablet (20 mg total) by mouth daily.    Dispense:  90 tablet    Refill:  3  . diltiazem (CARDIZEM CD) 240 MG 24 hr capsule    Sig: Take 1 capsule (240 mg total) by mouth daily.    Dispense:  90 capsule    Refill:  3    Time spent: 25 Minutes   Dr Lavera Guise Internal medicine

## 2017-05-27 ENCOUNTER — Other Ambulatory Visit: Payer: Self-pay | Admitting: Internal Medicine

## 2017-05-31 ENCOUNTER — Other Ambulatory Visit: Payer: Self-pay

## 2017-05-31 MED ORDER — PANTOPRAZOLE SODIUM 40 MG PO TBEC: 40.0000 mg | DELAYED_RELEASE_TABLET | Freq: Two times a day (BID) | ORAL | 1 refills | Status: DC

## 2017-06-13 ENCOUNTER — Ambulatory Visit: Payer: Medicare Other | Admitting: Internal Medicine

## 2017-06-16 DIAGNOSIS — E782 Mixed hyperlipidemia: Secondary | ICD-10-CM | POA: Diagnosis not present

## 2017-06-16 DIAGNOSIS — I10 Essential (primary) hypertension: Secondary | ICD-10-CM | POA: Diagnosis not present

## 2017-06-16 DIAGNOSIS — E039 Hypothyroidism, unspecified: Secondary | ICD-10-CM | POA: Diagnosis not present

## 2017-06-17 LAB — CBC WITH DIFFERENTIAL/PLATELET
BASOS ABS: 0.1 10*3/uL (ref 0.0–0.2)
Basos: 1 %
EOS (ABSOLUTE): 0.1 10*3/uL (ref 0.0–0.4)
Eos: 2 %
HEMOGLOBIN: 12.7 g/dL (ref 11.1–15.9)
Hematocrit: 38.9 % (ref 34.0–46.6)
Immature Grans (Abs): 0 10*3/uL (ref 0.0–0.1)
Immature Granulocytes: 0 %
LYMPHS ABS: 3.7 10*3/uL — AB (ref 0.7–3.1)
Lymphs: 40 %
MCH: 29.3 pg (ref 26.6–33.0)
MCHC: 32.6 g/dL (ref 31.5–35.7)
MCV: 90 fL (ref 79–97)
MONOCYTES: 6 %
MONOS ABS: 0.6 10*3/uL (ref 0.1–0.9)
NEUTROS ABS: 4.8 10*3/uL (ref 1.4–7.0)
Neutrophils: 51 %
Platelets: 311 10*3/uL (ref 150–379)
RBC: 4.34 x10E6/uL (ref 3.77–5.28)
RDW: 12.7 % (ref 12.3–15.4)
WBC: 9.2 10*3/uL (ref 3.4–10.8)

## 2017-06-17 LAB — T4, FREE: Free T4: 1.26 ng/dL (ref 0.82–1.77)

## 2017-06-17 LAB — COMPREHENSIVE METABOLIC PANEL
ALK PHOS: 64 IU/L (ref 39–117)
ALT: 36 IU/L — ABNORMAL HIGH (ref 0–32)
AST: 30 IU/L (ref 0–40)
Albumin/Globulin Ratio: 2.9 — ABNORMAL HIGH (ref 1.2–2.2)
Albumin: 4.6 g/dL (ref 3.5–4.8)
BILIRUBIN TOTAL: 0.5 mg/dL (ref 0.0–1.2)
BUN/Creatinine Ratio: 8 — ABNORMAL LOW (ref 12–28)
BUN: 6 mg/dL — AB (ref 8–27)
CHLORIDE: 100 mmol/L (ref 96–106)
CO2: 22 mmol/L (ref 20–29)
Calcium: 10.3 mg/dL (ref 8.7–10.3)
Creatinine, Ser: 0.8 mg/dL (ref 0.57–1.00)
GFR calc Af Amer: 85 mL/min/{1.73_m2} (ref >59)
GFR calc non Af Amer: 74 mL/min/{1.73_m2} (ref >59)
GLUCOSE: 136 mg/dL — AB (ref 65–99)
Globulin, Total: 1.6 g/dL (ref 1.5–4.5)
Potassium: 4.9 mmol/L (ref 3.5–5.2)
Sodium: 138 mmol/L (ref 134–144)
TOTAL PROTEIN: 6.2 g/dL (ref 6.0–8.5)

## 2017-06-17 LAB — LIPID PANEL WITH LDL/HDL RATIO
CHOLESTEROL TOTAL: 115 mg/dL (ref 100–199)
HDL: 34 mg/dL — ABNORMAL LOW (ref >39)
LDL CALC: 54 mg/dL (ref 0–99)
LDL/HDL RATIO: 1.6 ratio (ref 0.0–3.2)
Triglycerides: 137 mg/dL (ref 0–149)
VLDL Cholesterol Cal: 27 mg/dL (ref 5–40)

## 2017-06-17 LAB — TSH: TSH: 0.852 u[IU]/mL (ref 0.450–4.500)

## 2017-06-17 LAB — IRON,TIBC AND FERRITIN PANEL
Ferritin: 52 ng/mL (ref 15–150)
Iron Saturation: 23 % (ref 15–55)
Iron: 69 ug/dL (ref 27–139)
Total Iron Binding Capacity: 300 ug/dL (ref 250–450)
UIBC: 231 ug/dL (ref 118–369)

## 2017-06-17 LAB — B12 AND FOLATE PANEL: Vitamin B-12: 686 pg/mL (ref 232–1245)

## 2017-07-10 ENCOUNTER — Ambulatory Visit: Payer: Medicare Other | Admitting: Nurse Practitioner

## 2017-07-14 ENCOUNTER — Ambulatory Visit (INDEPENDENT_AMBULATORY_CARE_PROVIDER_SITE_OTHER): Payer: Medicare Other | Admitting: Nurse Practitioner

## 2017-07-14 ENCOUNTER — Encounter: Payer: Self-pay | Admitting: Nurse Practitioner

## 2017-07-14 VITALS — BP 130/70 | HR 75 | Resp 16 | Ht 60.0 in | Wt 174.8 lb

## 2017-07-14 DIAGNOSIS — E039 Hypothyroidism, unspecified: Secondary | ICD-10-CM | POA: Diagnosis not present

## 2017-07-14 DIAGNOSIS — F322 Major depressive disorder, single episode, severe without psychotic features: Secondary | ICD-10-CM | POA: Diagnosis not present

## 2017-07-14 DIAGNOSIS — E1165 Type 2 diabetes mellitus with hyperglycemia: Secondary | ICD-10-CM | POA: Diagnosis not present

## 2017-07-14 DIAGNOSIS — I10 Essential (primary) hypertension: Secondary | ICD-10-CM

## 2017-07-14 DIAGNOSIS — Z23 Encounter for immunization: Secondary | ICD-10-CM | POA: Diagnosis not present

## 2017-07-14 NOTE — Progress Notes (Signed)
[facility] [place], [place] [address]  Internal MEDICINE  Office Visit Note  Patient Name: [name]  [id number]  [id number]  Date of Service: 08/05/2017   Pt is here for routine follow up.   Chief Complaint  Patient presents with  . Hypertension    follow up    Hypertension  This is a chronic problem. The current episode started more than 1 year ago. The problem has been gradually improving since onset. The problem is controlled. Associated symptoms include headaches. Pertinent negatives include no chest pain, neck pain, palpitations or shortness of breath. Agents associated with hypertension include thyroid hormones. Risk factors for coronary artery disease include post-menopausal state, dyslipidemia and diabetes mellitus. Past treatments include beta blockers, ACE inhibitors and calcium channel blockers. The current treatment provides moderate improvement. There are no compliance problems.  Identifiable causes of hypertension include a hypertension causing med.       Current Medication: Outpatient Encounter Medications as of 07/14/2017  Medication Sig Note  . albuterol (PROVENTIL) (2.5 MG/3ML) 0.083% nebulizer solution Take 3 mLs (2.5 mg total) by nebulization every 6 (six) hours as needed for wheezing or shortness of breath.   Marland Kitchen aspirin 81 MG tablet Take 1 tablet by mouth daily.   Marland Kitchen atorvastatin (LIPITOR) 20 MG tablet Take 1 tablet (20 mg total) by mouth daily.   . busPIRone (BUSPAR) 10 MG tablet Take 1 tablet (10 mg total) by mouth 2 (two) times daily. Take 1/2 to 1 tablet by mouth twice daily.   Marland Kitchen diltiazem (CARDIZEM CD) 240 MG 24 hr capsule Take 1 capsule (240 mg total) by mouth daily.   Marland Kitchen levothyroxine (SYNTHROID, LEVOTHROID) 50 MCG tablet Take 1 tablet (50 mcg total) by mouth every morning.   Marland Kitchen lisinopril (PRINIVIL,ZESTRIL) 20 MG tablet Take 1 tablet (20 mg total) by mouth daily.   . meloxicam (MOBIC) 7.5 MG tablet Take 7.5 mg by mouth 2  (two) times daily. 09/12/2016: Pt has not started yet. New script  . metFORMIN (GLUCOPHAGE) 500 MG tablet Take 1 tablet (500 mg total) by mouth 2 (two) times daily with a meal.   . metoprolol tartrate (LOPRESSOR) 50 MG tablet Take 1 tablet (50 mg total) by mouth 2 (two) times daily.   Marland Kitchen oxycodone (OXY-IR) 5 MG capsule Take 5 mg by mouth every 4 (four) hours as needed.   . pantoprazole (PROTONIX) 40 MG tablet Take 1 tablet (40 mg total) by mouth 2 (two) times daily.   . promethazine (PHENERGAN) 25 MG tablet Take 25 mg by mouth 2 (two) times daily as needed for nausea or vomiting.   Marland Kitchen QUEtiapine (SEROQUEL) 50 MG tablet Take 1 tablet (50 mg total) by mouth at bedtime.   . sucralfate (CARAFATE) 1 g tablet TAKE 1 TABLET BY MOUTH FOUR TIMES A DAY IF NEEDED   . tiZANidine (ZANAFLEX) 2 MG tablet Take 2 mg by mouth every 6 (six) hours as needed for muscle spasms.   Marland Kitchen zolpidem (AMBIEN) 10 MG tablet Take 1 tablet (10 mg total) by mouth at bedtime as needed for sleep.   . [DISCONTINUED] citalopram (CELEXA) 40 MG tablet Take 1 tablet (40 mg total) by mouth daily.   . [DISCONTINUED] predniSONE (DELTASONE) 20 MG tablet Take 2 tablets (40 mg total) by mouth daily.    No facility-administered encounter medications on file as of 07/14/2017.     Surgical History: Past Surgical History:  Procedure Laterality Date  . ABDOMINAL HYSTERECTOMY    .  BREAST BIOPSY      Medical History: Past Medical History:  Diagnosis Date  . Anxiety   . Depression   . Diabetes mellitus, type II ([place])   . GERD (gastroesophageal reflux disease)   . Hypertension   . Thyroid disease     Family History: Family History  Problem Relation Age of Onset  . Anxiety disorder Mother   . Colon cancer Mother   . Heart disease Father   . Anxiety disorder Father   . Obesity Sister   . Anxiety disorder Sister   . Depression Sister   . Osteoporosis Sister     Social History   Socioeconomic History  . Marital status: Widowed     Spouse name: Not on file  . Number of children: Not on file  . Years of education: Not on file  . Highest education level: Not on file  Occupational History  . Not on file  Social Needs  . Financial resource strain: Not on file  . Food insecurity:    Worry: Not on file    Inability: Not on file  . Transportation needs:    Medical: Not on file    Non-medical: Not on file  Tobacco Use  . Smoking status: Never Smoker  . Smokeless tobacco: Never Used  Substance and Sexual Activity  . Alcohol use: No    Alcohol/week: 0.0 oz  . Drug use: No  . Sexual activity: Never  Lifestyle  . Physical activity:    Days per week: Not on file    Minutes per session: Not on file  . Stress: Not on file  Relationships  . Social connections:    Talks on phone: Not on file    Gets together: Not on file    Attends religious service: Not on file    Active member of club or organization: Not on file    Attends meetings of clubs or organizations: Not on file    Relationship status: Not on file  . Intimate partner violence:    Fear of current or ex partner: Not on file    Emotionally abused: Not on file    Physically abused: Not on file    Forced sexual activity: Not on file  Other Topics Concern  . Not on file  Social History Narrative  . Not on file      Review of Systems  Constitutional: Positive for fatigue. Negative for chills and unexpected weight change.  HENT: Negative for congestion, postnasal drip, rhinorrhea, sneezing and sore throat.   Eyes: Negative.  Negative for redness.  Respiratory: Negative for cough, chest tightness, shortness of breath and wheezing.   Cardiovascular: Negative for chest pain, palpitations and leg swelling.  Gastrointestinal: Negative for abdominal pain, constipation, diarrhea, nausea and vomiting.  Endocrine: Negative for cold intolerance, heat intolerance, polydipsia, polyphagia and polyuria.       Blood sugars doing well   Genitourinary: Negative for  dysuria and frequency.  Musculoskeletal: Negative for arthralgias, back pain, joint swelling and neck pain.  Skin: Negative for rash.  Allergic/Immunologic: Positive for environmental allergies.  Neurological: Positive for weakness and headaches. Negative for tremors, speech difficulty and numbness.  Hematological: Negative for adenopathy. Does not bruise/bleed easily.  Psychiatric/Behavioral: Positive for dysphoric mood. Negative for behavioral problems (Depression), sleep disturbance and suicidal ideas. The patient is nervous/anxious.     Today's Vitals   07/14/17 1532  BP: 130/70  Pulse: 75  Resp: 16  SpO2: 95%  Weight: 174 lb 12.8  oz (79.3 kg)  Height: 5' (1.524 m)   Physical Exam  Constitutional: She is oriented to person, place, and time. She appears well-developed and well-nourished. No distress.  HENT:  Head: Normocephalic and atraumatic.  Nose: Nose normal.  Mouth/Throat: Oropharynx is clear and moist. No oropharyngeal exudate.  Eyes: Pupils are equal, round, and reactive to light. Conjunctivae and EOM are normal.  Neck: Normal range of motion. Neck supple. No JVD present. Carotid bruit is not present. No tracheal deviation present. No thyromegaly present.  Cardiovascular: Normal rate, regular rhythm, normal heart sounds and intact distal pulses. Exam reveals no gallop and no friction rub.  No murmur heard. Pulmonary/Chest: Effort normal and breath sounds normal. No respiratory distress. She has no wheezes. She has no rales. She exhibits no tenderness.  Abdominal: Soft. Bowel sounds are normal. There is no tenderness.  Musculoskeletal: Normal range of motion.  Lymphadenopathy:    She has no cervical adenopathy.  Neurological: She is alert and oriented to person, place, and time. No cranial nerve deficit.  Skin: Skin is warm and dry. She is not diaphoretic.  Psychiatric: She has a normal mood and affect. Her behavior is normal. Judgment and thought content normal.  Nursing  note and vitals reviewed.  Assessment/Plan: 1. Essential hypertension, benign Well controlled. Continue bp medication as prescribe.d   2. Uncontrolled type 2 diabetes mellitus with hyperglycemia ([place]) Continue diabetic medication as prescribed. Refer to opthamology for diabetic eye exam.  - Ambulatory referral to Ophthalmology  3. Hypothyroidism, unspecified type Continue levothyroxine as prescribed.   4. Depression, major, single episode, severe (HCC) Continue buspirone and seroquel as needed and as prescribed.   5. Need for vaccination against Streptococcus pneumoniae using pneumococcal conjugate vaccine 13 - Pneumococcal conjugate vaccine 13-valent IM  General Counseling: Zuria verbalizes understanding of the findings of todays visit and agrees with plan of treatment. I have discussed any further diagnostic evaluation that may be needed or ordered today. We also reviewed her medications today. she has been encouraged to call the office with any questions or concerns that should arise related to todays visit.    Counseling:  This patient was seen by Leretha Pol, FNP- C in Collaboration with Dr Lavera Guise as a part of collaborative care agreement  Orders Placed This Encounter  Procedures  . Pneumococcal conjugate vaccine 13-valent IM  . Ambulatory referral to Ophthalmology      Time spent: [place] [facility]

## 2017-08-01 ENCOUNTER — Other Ambulatory Visit: Payer: Self-pay

## 2017-08-01 MED ORDER — CITALOPRAM HYDROBROMIDE 40 MG PO TABS: 40.0000 mg | ORAL_TABLET | Freq: Every day | ORAL | 0 refills | Status: DC

## 2017-08-05 DIAGNOSIS — I10 Essential (primary) hypertension: Secondary | ICD-10-CM | POA: Insufficient documentation

## 2017-08-05 DIAGNOSIS — I152 Hypertension secondary to endocrine disorders: Secondary | ICD-10-CM | POA: Insufficient documentation

## 2017-08-05 DIAGNOSIS — F322 Major depressive disorder, single episode, severe without psychotic features: Secondary | ICD-10-CM | POA: Insufficient documentation

## 2017-08-05 DIAGNOSIS — Z23 Encounter for immunization: Secondary | ICD-10-CM | POA: Insufficient documentation

## 2017-08-05 DIAGNOSIS — E039 Hypothyroidism, unspecified: Secondary | ICD-10-CM | POA: Insufficient documentation

## 2017-08-05 DIAGNOSIS — E1165 Type 2 diabetes mellitus with hyperglycemia: Secondary | ICD-10-CM | POA: Insufficient documentation

## 2017-09-07 ENCOUNTER — Other Ambulatory Visit: Payer: Self-pay

## 2017-09-07 ENCOUNTER — Telehealth: Payer: Self-pay

## 2017-09-07 DIAGNOSIS — G479 Sleep disorder, unspecified: Secondary | ICD-10-CM

## 2017-09-07 DIAGNOSIS — R5383 Other fatigue: Principal | ICD-10-CM

## 2017-09-07 MED ORDER — BUSPIRONE HCL 10 MG PO TABS: ORAL_TABLET | ORAL | 1 refills | Status: DC

## 2017-09-07 MED ORDER — ZOLPIDEM TARTRATE 10 MG PO TABS: 10.0000 mg | ORAL_TABLET | Freq: Every evening | ORAL | 1 refills | Status: DC | PRN

## 2017-09-07 NOTE — Telephone Encounter (Signed)
Called in refill for zolpidem 10mg  to [facility] with 1 refill

## 2017-09-20 ENCOUNTER — Encounter: Payer: Self-pay | Admitting: Nurse Practitioner

## 2017-09-20 ENCOUNTER — Ambulatory Visit (INDEPENDENT_AMBULATORY_CARE_PROVIDER_SITE_OTHER): Payer: Medicare Other | Admitting: Nurse Practitioner

## 2017-09-20 VITALS — BP 144/71 | HR 77 | Resp 16 | Ht 65.0 in | Wt 174.0 lb

## 2017-09-20 DIAGNOSIS — E1165 Type 2 diabetes mellitus with hyperglycemia: Secondary | ICD-10-CM

## 2017-09-20 DIAGNOSIS — E2839 Other primary ovarian failure: Secondary | ICD-10-CM

## 2017-09-20 DIAGNOSIS — G8929 Other chronic pain: Secondary | ICD-10-CM

## 2017-09-20 DIAGNOSIS — M5441 Lumbago with sciatica, right side: Secondary | ICD-10-CM

## 2017-09-20 LAB — POCT GLYCOSYLATED HEMOGLOBIN (HGB A1C): Hemoglobin A1C: 6.8 % — AB (ref 4.0–5.6)

## 2017-09-20 MED ORDER — OXYCODONE HCL 5 MG PO TABS: 5.0000 mg | ORAL_TABLET | Freq: Three times a day (TID) | ORAL | 0 refills | Status: DC | PRN

## 2017-09-20 NOTE — Progress Notes (Signed)
[facility] [place], [place] [address]  Internal MEDICINE  Office Visit Note  Patient Name: [name]  [id number]  [id number]  Date of Service: 09/27/2017  Chief Complaint  Patient presents with  . Diabetes  . Quality Metric Gaps    Bone density    Patient is c/o dry, red skin along the nose and cheeks. Slightly itchy. Not painful.  She does have intermittent low back which radiates across her right hip and down into her right leg. Most of the time, When it is severe, she rates pain as 8/10. She can take tylenol and this will bring pain to 5/10 most of the time. She has had to use oxycodone, previously prescribed by orthopedics, when pain is severe. Last prescriptions was over a year ago, and she still has a few left in the bottle. She avoids taking this as much as possible. She is asking if she can have a new prescription to have if this pain gets bad.   Diabetes  She presents for her follow-up diabetic visit. She has type 2 diabetes mellitus. Hypoglycemia symptoms include headaches and nervousness/anxiousness. Pertinent negatives for hypoglycemia include no speech difficulty or tremors. Associated symptoms include fatigue and weakness. Pertinent negatives for diabetes include no chest pain, no polydipsia, no polyphagia and no polyuria.       Current Medication: Outpatient Encounter Medications as of 09/20/2017  Medication Sig Note  . albuterol (PROVENTIL) (2.5 MG/3ML) 0.083% nebulizer solution Take 3 mLs (2.5 mg total) by nebulization every 6 (six) hours as needed for wheezing or shortness of breath.   Marland Kitchen aspirin 81 MG tablet Take 1 tablet by mouth daily.   Marland Kitchen atorvastatin (LIPITOR) 20 MG tablet Take 1 tablet (20 mg total) by mouth daily.   . busPIRone (BUSPAR) 10 MG tablet Take 1/2 to 1 tablet by mouth twice daily.   . citalopram (CELEXA) 40 MG tablet Take 1 tablet (40 mg total) by mouth daily.   Marland Kitchen diltiazem (CARDIZEM CD) 240 MG 24 hr capsule Take 1  capsule (240 mg total) by mouth daily.   Marland Kitchen levothyroxine (SYNTHROID, LEVOTHROID) 50 MCG tablet Take 1 tablet (50 mcg total) by mouth every morning.   Marland Kitchen lisinopril (PRINIVIL,ZESTRIL) 20 MG tablet Take 1 tablet (20 mg total) by mouth daily.   . meloxicam (MOBIC) 7.5 MG tablet Take 7.5 mg by mouth 2 (two) times daily. 09/12/2016: Pt has not started yet. New script  . metFORMIN (GLUCOPHAGE) 500 MG tablet Take 1 tablet (500 mg total) by mouth 2 (two) times daily with a meal.   . metoprolol tartrate (LOPRESSOR) 50 MG tablet Take 1 tablet (50 mg total) by mouth 2 (two) times daily.   Marland Kitchen oxyCODONE (OXY IR/ROXICODONE) 5 MG immediate release tablet Take 1 tablet (5 mg total) by mouth 3 (three) times daily as needed for severe pain.   . pantoprazole (PROTONIX) 40 MG tablet Take 1 tablet (40 mg total) by mouth 2 (two) times daily.   . promethazine (PHENERGAN) 25 MG tablet Take 25 mg by mouth 2 (two) times daily as needed for nausea or vomiting.   Marland Kitchen QUEtiapine (SEROQUEL) 50 MG tablet Take 1 tablet (50 mg total) by mouth at bedtime.   . sucralfate (CARAFATE) 1 g tablet TAKE 1 TABLET BY MOUTH FOUR TIMES A DAY IF NEEDED   . tiZANidine (ZANAFLEX) 2 MG tablet Take 2 mg by mouth every 6 (six) hours as needed for muscle spasms.   Marland Kitchen zolpidem (AMBIEN) 10 MG  tablet Take 1 tablet (10 mg total) by mouth at bedtime as needed for sleep.   . [DISCONTINUED] oxycodone (OXY-IR) 5 MG capsule Take 5 mg by mouth every 4 (four) hours as needed.    No facility-administered encounter medications on file as of 09/20/2017.     Surgical History: Past Surgical History:  Procedure Laterality Date  . ABDOMINAL HYSTERECTOMY    . BREAST BIOPSY      Medical History: Past Medical History:  Diagnosis Date  . Anxiety   . Depression   . Diabetes mellitus, type II ([place])   . GERD (gastroesophageal reflux disease)   . Hypertension   . Thyroid disease     Family History: Family History  Problem Relation Age of Onset  . Anxiety  disorder Mother   . Colon cancer Mother   . Heart disease Father   . Anxiety disorder Father   . Obesity Sister   . Anxiety disorder Sister   . Depression Sister   . Osteoporosis Sister     Social History   Socioeconomic History  . Marital status: Widowed    Spouse name: Not on file  . Number of children: Not on file  . Years of education: Not on file  . Highest education level: Not on file  Occupational History  . Not on file  Social Needs  . Financial resource strain: Not on file  . Food insecurity:    Worry: Not on file    Inability: Not on file  . Transportation needs:    Medical: Not on file    Non-medical: Not on file  Tobacco Use  . Smoking status: Never Smoker  . Smokeless tobacco: Never Used  Substance and Sexual Activity  . Alcohol use: No    Alcohol/week: 0.0 standard drinks  . Drug use: No  . Sexual activity: Never  Lifestyle  . Physical activity:    Days per week: Not on file    Minutes per session: Not on file  . Stress: Not on file  Relationships  . Social connections:    Talks on phone: Not on file    Gets together: Not on file    Attends religious service: Not on file    Active member of club or organization: Not on file    Attends meetings of clubs or organizations: Not on file    Relationship status: Not on file  . Intimate partner violence:    Fear of current or ex partner: Not on file    Emotionally abused: Not on file    Physically abused: Not on file    Forced sexual activity: Not on file  Other Topics Concern  . Not on file  Social History Narrative  . Not on file      Review of Systems  Constitutional: Positive for fatigue. Negative for chills and unexpected weight change.  HENT: Negative for congestion, postnasal drip, rhinorrhea, sneezing and sore throat.   Eyes: Negative.  Negative for redness.  Respiratory: Negative for cough, chest tightness, shortness of breath and wheezing.   Cardiovascular: Negative for chest pain,  palpitations and leg swelling.  Gastrointestinal: Negative for abdominal pain, constipation, diarrhea, nausea and vomiting.  Endocrine: Negative for cold intolerance, heat intolerance, polydipsia, polyphagia and polyuria.       Blood sugars doing well   Genitourinary: Negative for dysuria and frequency.  Musculoskeletal: Negative for arthralgias, back pain, joint swelling and neck pain.  Skin: Negative for rash.  Allergic/Immunologic: Positive for environmental allergies.  Neurological: Positive for weakness and headaches. Negative for tremors, speech difficulty and numbness.  Hematological: Negative for adenopathy. Does not bruise/bleed easily.  Psychiatric/Behavioral: Positive for dysphoric mood. Negative for behavioral problems (Depression), sleep disturbance and suicidal ideas. The patient is nervous/anxious.     Today's Vitals   09/20/17 1134  BP: (!) 144/71  Pulse: 77  Resp: 16  SpO2: 94%  Weight: 174 lb (78.9 kg)  Height: 5\' 5"  (1.651 m)    Physical Exam  Constitutional: She is oriented to person, place, and time. She appears well-developed and well-nourished. No distress.  HENT:  Head: Normocephalic and atraumatic.  Nose: Nose normal.  Mouth/Throat: Oropharynx is clear and moist. No oropharyngeal exudate.  Eyes: Pupils are equal, round, and reactive to light. Conjunctivae and EOM are normal.  Neck: Normal range of motion. Neck supple. No JVD present. Carotid bruit is not present. No tracheal deviation present. No thyromegaly present.  Cardiovascular: Normal rate, regular rhythm and normal heart sounds. Exam reveals no gallop and no friction rub.  No murmur heard. Pulmonary/Chest: Effort normal and breath sounds normal. No respiratory distress. She has no wheezes. She has no rales. She exhibits no tenderness.  Abdominal: Soft. Bowel sounds are normal. There is no tenderness.  Musculoskeletal: Normal range of motion.  Lymphadenopathy:    She has no cervical adenopathy.   Neurological: She is alert and oriented to person, place, and time. No cranial nerve deficit.  Skin: Skin is warm and dry. She is not diaphoretic.  Psychiatric: She has a normal mood and affect. Her behavior is normal. Judgment and thought content normal.  Nursing note and vitals reviewed.  Assessment/Plan:  1. Uncontrolled type 2 diabetes mellitus with hyperglycemia (HCC) - POCT glycosylated hemoglobin (Hb A1C) 6.8 today. Continue diabetic medications as prescribed. xamples ozempic given to patient.  2. Ovarian failure - DG Bone Density; Future  3. Chronic right-sided low back pain with right-sided sciatica Added oxycodone 5mg  up to three times daily. Expectation is to take as seldom as possible. Also reviewed risk factors associated with taking narcotic pain relievers.  - oxyCODONE (OXY IR/ROXICODONE) 5 MG immediate release tablet; Take 1 tablet (5 mg total) by mouth 3 (three) times daily as needed for severe pain.  Dispense: 15 tablet; Refill: 0  General Counseling: Akiba verbalizes understanding of the findings of todays visit and agrees with plan of treatment. I have discussed any further diagnostic evaluation that may be needed or ordered today. We also reviewed her medications today. she has been encouraged to call the office with any questions or concerns that should arise related to todays visit.  Reviewed risks and possible side effects associated with taking opiates and benzodiazepines. Combination of these could cause dizziness and drowsiness. Advised patient not to drive or operate machinery when taking these medications, as patient's and other's life can be at risk and will have consequences. Patient verbalized understanding in this matter.   This patient was seen by Leretha Pol FNP Collaboration with Dr Lavera Guise as a part of collaborative care agreement  Orders Placed This Encounter  Procedures  . DG Bone Density  . POCT glycosylated hemoglobin (Hb A1C)    Meds  ordered this encounter  Medications  . oxyCODONE (OXY IR/ROXICODONE) 5 MG immediate release tablet    Sig: Take 1 tablet (5 mg total) by mouth 3 (three) times daily as needed for severe pain.    Dispense:  15 tablet    Refill:  0    Order Specific  Question:   Supervising Provider    Answer:   Lavera Guise [[id number]]    Time spent: 69 Minutes      Dr Lavera Guise Internal medicine

## 2017-09-21 ENCOUNTER — Other Ambulatory Visit: Payer: Self-pay | Admitting: Nurse Practitioner

## 2017-09-21 ENCOUNTER — Telehealth: Payer: Self-pay

## 2017-09-21 DIAGNOSIS — Z1231 Encounter for screening mammogram for malignant neoplasm of breast: Secondary | ICD-10-CM

## 2017-09-21 NOTE — Telephone Encounter (Signed)
Spoke with patient's daughter informing her that the orders for the patient's mammogram and bone density are ready and that she can call [facility] to get them scheduled

## 2017-09-22 ENCOUNTER — Telehealth: Payer: Self-pay | Admitting: Nurse Practitioner

## 2017-09-22 DIAGNOSIS — L209 Atopic dermatitis, unspecified: Secondary | ICD-10-CM

## 2017-09-22 MED ORDER — CRISABOROLE 2 % EX OINT: 1.0000 "application " | TOPICAL_OINTMENT | Freq: Two times a day (BID) | CUTANEOUS | 2 refills | Status: DC | PRN

## 2017-09-22 NOTE — Telephone Encounter (Signed)
Was seen on Wednesday , was given samples of a cream and she is wanting a Rx for the cream

## 2017-09-22 NOTE — Telephone Encounter (Signed)
Sent in rx for eucrisa which should be used sparingly to affected areas twice daily as needed.

## 2017-09-27 ENCOUNTER — Telehealth: Payer: Self-pay

## 2017-09-27 DIAGNOSIS — G8929 Other chronic pain: Secondary | ICD-10-CM | POA: Insufficient documentation

## 2017-09-27 DIAGNOSIS — M5441 Lumbago with sciatica, right side: Secondary | ICD-10-CM

## 2017-09-27 DIAGNOSIS — E2839 Other primary ovarian failure: Secondary | ICD-10-CM | POA: Insufficient documentation

## 2017-09-27 NOTE — Telephone Encounter (Signed)
Faxed [facility] QualityOMW Report with most recent note attached indicating that a bone density test was performed

## 2017-09-29 ENCOUNTER — Telehealth: Payer: Self-pay | Admitting: Nurse Practitioner

## 2017-09-29 NOTE — Telephone Encounter (Signed)
Prior authorization for [name] has been approved

## 2017-09-29 NOTE — Telephone Encounter (Signed)
Cream that was prescribed is not covered , prefferred medication is mometasone cream or triaminoclone cream

## 2017-09-29 NOTE — Telephone Encounter (Signed)
These aer spots on her face, close to her eyes. I don't feel like these preferred medications are safe for this area.

## 2017-10-04 ENCOUNTER — Ambulatory Visit
Admission: RE | Admit: 2017-10-04 | Discharge: 2017-10-04 | Disposition: A | Discharge status: Home / Self Care | Payer: Medicare Other | Source: Ambulatory Visit | Attending: Nurse Practitioner | Admitting: Nurse Practitioner

## 2017-10-04 DIAGNOSIS — M810 Age-related osteoporosis without current pathological fracture: Secondary | ICD-10-CM | POA: Diagnosis not present

## 2017-10-04 DIAGNOSIS — E2839 Other primary ovarian failure: Secondary | ICD-10-CM

## 2017-10-04 DIAGNOSIS — Z780 Asymptomatic menopausal state: Secondary | ICD-10-CM | POA: Diagnosis not present

## 2017-10-12 ENCOUNTER — Ambulatory Visit
Admission: RE | Admit: 2017-10-12 | Discharge: 2017-10-12 | Disposition: A | Discharge status: Home / Self Care | Payer: Medicare Other | Source: Ambulatory Visit | Attending: Nurse Practitioner | Admitting: Nurse Practitioner

## 2017-10-12 ENCOUNTER — Other Ambulatory Visit: Payer: Medicare Other

## 2017-10-12 DIAGNOSIS — Z1231 Encounter for screening mammogram for malignant neoplasm of breast: Secondary | ICD-10-CM | POA: Insufficient documentation

## 2017-10-20 ENCOUNTER — Ambulatory Visit: Payer: Self-pay | Admitting: Nurse Practitioner

## 2017-10-24 ENCOUNTER — Other Ambulatory Visit: Payer: Self-pay

## 2017-10-24 ENCOUNTER — Other Ambulatory Visit: Payer: Self-pay | Admitting: Nurse Practitioner

## 2017-10-24 MED ORDER — CITALOPRAM HYDROBROMIDE 40 MG PO TABS: 40.0000 mg | ORAL_TABLET | Freq: Every day | ORAL | 0 refills | Status: DC

## 2017-10-24 MED ORDER — CITALOPRAM HYDROBROMIDE 40 MG PO TABS: 40.0000 mg | ORAL_TABLET | Freq: Every day | ORAL | 1 refills | Status: DC

## 2017-11-02 ENCOUNTER — Other Ambulatory Visit: Payer: Self-pay | Admitting: Internal Medicine

## 2017-11-07 ENCOUNTER — Other Ambulatory Visit: Payer: Self-pay | Admitting: Internal Medicine

## 2017-11-07 DIAGNOSIS — G479 Sleep disorder, unspecified: Secondary | ICD-10-CM

## 2017-11-07 DIAGNOSIS — R5383 Other fatigue: Principal | ICD-10-CM

## 2017-11-08 NOTE — Telephone Encounter (Signed)
Last 8/19 and next 11/19

## 2017-11-09 ENCOUNTER — Telehealth: Payer: Self-pay

## 2017-11-09 NOTE — Telephone Encounter (Signed)
Called in [place] 10 mg one tab daily 30 with 1 refills as per heather pt had appt next month

## 2017-11-20 ENCOUNTER — Other Ambulatory Visit: Payer: Self-pay

## 2017-11-20 MED ORDER — PANTOPRAZOLE SODIUM 40 MG PO TBEC: 40.0000 mg | DELAYED_RELEASE_TABLET | Freq: Two times a day (BID) | ORAL | 1 refills | Status: DC

## 2017-12-01 DIAGNOSIS — H2513 Age-related nuclear cataract, bilateral: Secondary | ICD-10-CM | POA: Diagnosis not present

## 2017-12-28 ENCOUNTER — Encounter: Payer: Self-pay | Admitting: Nurse Practitioner

## 2017-12-28 ENCOUNTER — Ambulatory Visit (INDEPENDENT_AMBULATORY_CARE_PROVIDER_SITE_OTHER): Payer: Medicare Other | Admitting: Nurse Practitioner

## 2017-12-28 VITALS — BP 155/79 | HR 94 | Resp 16 | Ht 60.0 in | Wt 177.0 lb

## 2017-12-28 DIAGNOSIS — R5383 Other fatigue: Secondary | ICD-10-CM

## 2017-12-28 DIAGNOSIS — Z0001 Encounter for general adult medical examination with abnormal findings: Secondary | ICD-10-CM | POA: Diagnosis not present

## 2017-12-28 DIAGNOSIS — G479 Sleep disorder, unspecified: Secondary | ICD-10-CM

## 2017-12-28 DIAGNOSIS — M5440 Lumbago with sciatica, unspecified side: Secondary | ICD-10-CM | POA: Diagnosis not present

## 2017-12-28 DIAGNOSIS — E1165 Type 2 diabetes mellitus with hyperglycemia: Secondary | ICD-10-CM | POA: Diagnosis not present

## 2017-12-28 DIAGNOSIS — I10 Essential (primary) hypertension: Secondary | ICD-10-CM

## 2017-12-28 MED ORDER — TIZANIDINE HCL 2 MG PO TABS: 2.0000 mg | ORAL_TABLET | Freq: Two times a day (BID) | ORAL | 1 refills | Status: DC | PRN

## 2017-12-28 MED ORDER — ZOLPIDEM TARTRATE 10 MG PO TABS: 10.0000 mg | ORAL_TABLET | Freq: Every evening | ORAL | 3 refills | Status: DC | PRN

## 2017-12-28 MED ORDER — METHYLPREDNISOLONE 4 MG PO TBPK: ORAL_TABLET | ORAL | 0 refills | Status: DC

## 2017-12-28 NOTE — Progress Notes (Signed)
[facility] [place], [place] [address]  Internal MEDICINE  Office Visit Note  Patient Name: [name] Pe  [id number]  [id number]  Date of Service: 12/30/2017   Pt is here for routine health maintenance examination  Chief Complaint  Patient presents with  . Annual Exam  . Diabetes  . Back Pain     Diabetes  She presents for her follow-up diabetic visit. She has type 2 diabetes mellitus. Her disease course has been stable. Hypoglycemia symptoms include headaches and nervousness/anxiousness. Pertinent negatives for hypoglycemia include no speech difficulty or tremors. Associated symptoms include fatigue and weakness. Pertinent negatives for diabetes include no chest pain, no polydipsia, no polyphagia and no polyuria. There are no hypoglycemic complications. Symptoms are stable. Risk factors for coronary artery disease include diabetes mellitus, dyslipidemia, hypertension and post-menopausal. Current diabetic treatment includes oral agent (monotherapy). She is compliant with treatment all of the time. Her weight is stable. She is following a generally healthy diet. Meal planning includes avoidance of concentrated sweets. She has not had a previous visit with a dietitian. She participates in exercise intermittently. There is no change in her home blood glucose trend. An ACE inhibitor/angiotensin II receptor blocker is being taken. She does not see a podiatrist.Eye exam is not current.  Back Pain  This is a new problem. The current episode started 1 to 4 weeks ago. The problem occurs constantly. The problem is unchanged. The pain is present in the lumbar spine and sacro-iliac. The quality of the pain is described as cramping and aching. The pain is at a severity of 8/10. The pain is moderate. The pain is the same all the time. The symptoms are aggravated by bending, twisting and position. Stiffness is present all day. Associated symptoms include headaches and  weakness. Pertinent negatives include no abdominal pain, chest pain, dysuria or numbness. Risk factors include sedentary lifestyle. She has tried heat, analgesics and muscle relaxant for the symptoms. The treatment provided mild relief.     Current Medication: Outpatient Encounter Medications as of 12/28/2017  Medication Sig Note  . albuterol (PROVENTIL) (2.5 MG/3ML) 0.083% nebulizer solution Take 3 mLs (2.5 mg total) by nebulization every 6 (six) hours as needed for wheezing or shortness of breath.   Marland Kitchen aspirin 81 MG tablet Take 1 tablet by mouth daily.   Marland Kitchen atorvastatin (LIPITOR) 20 MG tablet Take 1 tablet (20 mg total) by mouth daily.   . busPIRone (BUSPAR) 10 MG tablet Take 1/2 to 1 tablet by mouth twice daily.   . citalopram (CELEXA) 40 MG tablet Take 1 tablet (40 mg total) by mouth daily.   Stasia Cavalier (EUCRISA) 2 % OINT Apply 1 application topically 2 (two) times daily as needed.   . diltiazem (CARDIZEM CD) 240 MG 24 hr capsule Take 1 capsule (240 mg total) by mouth daily.   Marland Kitchen FLUAD 0.5 ML SUSY ADM 0.5ML IM UTD   . levothyroxine (SYNTHROID, LEVOTHROID) 50 MCG tablet Take 1 tablet (50 mcg total) by mouth every morning.   Marland Kitchen lisinopril (PRINIVIL,ZESTRIL) 20 MG tablet Take 1 tablet (20 mg total) by mouth daily.   . meloxicam (MOBIC) 7.5 MG tablet Take 7.5 mg by mouth 2 (two) times daily. 09/12/2016: Pt has not started yet. New script  . metFORMIN (GLUCOPHAGE) 500 MG tablet Take 1 tablet (500 mg total) by mouth 2 (two) times daily with a meal.   . methylPREDNISolone (MEDROL) 4 MG TBPK tablet Take by mouth as directed for 6 days   .  metoprolol tartrate (LOPRESSOR) 50 MG tablet Take 1 tablet (50 mg total) by mouth 2 (two) times daily.   Marland Kitchen oxyCODONE (OXY IR/ROXICODONE) 5 MG immediate release tablet Take 1 tablet (5 mg total) by mouth 3 (three) times daily as needed for severe pain.   . pantoprazole (PROTONIX) 40 MG tablet Take 1 tablet (40 mg total) by mouth 2 (two) times daily.   . promethazine  (PHENERGAN) 25 MG tablet Take 25 mg by mouth 2 (two) times daily as needed for nausea or vomiting.   Marland Kitchen QUEtiapine (SEROQUEL) 50 MG tablet Take 1 tablet (50 mg total) by mouth at bedtime.   . sucralfate (CARAFATE) 1 g tablet TAKE 1 TABLET BY MOUTH FOUR TIMES A DAY IF NEEDED   . tiZANidine (ZANAFLEX) 2 MG tablet Take 1 tablet (2 mg total) by mouth 2 (two) times daily as needed for muscle spasms.   Marland Kitchen zolpidem (AMBIEN) 10 MG tablet Take 1 tablet (10 mg total) by mouth at bedtime as needed for sleep.   . [DISCONTINUED] tiZANidine (ZANAFLEX) 2 MG tablet Take 2 mg by mouth every 6 (six) hours as needed for muscle spasms.   . [DISCONTINUED] zolpidem (AMBIEN) 10 MG tablet TAKE 1 TABLET BY MOUTH AT BEDTIME FOR SLEEP    No facility-administered encounter medications on file as of 12/28/2017.     Surgical History: Past Surgical History:  Procedure Laterality Date  . ABDOMINAL HYSTERECTOMY    . BREAST BIOPSY Bilateral 80s   benign    Medical History: Past Medical History:  Diagnosis Date  . Anxiety   . Depression   . Diabetes mellitus, type II ([place])   . GERD (gastroesophageal reflux disease)   . Hypertension   . Thyroid disease     Family History: Family History  Problem Relation Age of Onset  . Anxiety disorder Mother   . Colon cancer Mother   . Heart disease Father   . Anxiety disorder Father   . Obesity Sister   . Anxiety disorder Sister   . Depression Sister   . Osteoporosis Sister       Review of Systems  Constitutional: Positive for activity change and fatigue. Negative for chills and unexpected weight change.  HENT: Negative for congestion, postnasal drip, rhinorrhea, sneezing and sore throat.   Eyes: Negative.  Negative for redness.  Respiratory: Negative for cough, chest tightness, shortness of breath and wheezing.   Cardiovascular: Negative for chest pain, palpitations and leg swelling.  Gastrointestinal: Negative for abdominal pain, constipation, diarrhea, nausea and  vomiting.  Endocrine: Negative for cold intolerance, heat intolerance, polydipsia, polyphagia and polyuria.       Blood sugars doing well   Genitourinary: Negative for dysuria and frequency.  Musculoskeletal: Positive for arthralgias, back pain and myalgias. Negative for joint swelling and neck pain.  Skin: Negative for rash.  Allergic/Immunologic: Positive for environmental allergies.  Neurological: Positive for weakness and headaches. Negative for tremors, speech difficulty and numbness.  Hematological: Negative for adenopathy. Does not bruise/bleed easily.  Psychiatric/Behavioral: Positive for dysphoric mood. Negative for behavioral problems (Depression), sleep disturbance and suicidal ideas. The patient is nervous/anxious.      Today's Vitals   12/28/17 1530  BP: (!) 155/79  Pulse: 94  Resp: 16  SpO2: 94%  Weight: 177 lb (80.3 kg)  Height: 5' (1.524 m)    Physical Exam  Constitutional: She is oriented to person, place, and time. She appears well-developed and well-nourished. No distress.  HENT:  Head: Normocephalic and atraumatic.  Nose:  Nose normal.  Mouth/Throat: No oropharyngeal exudate.  Eyes: Pupils are equal, round, and reactive to light. Conjunctivae and EOM are normal.  Neck: Normal range of motion. Neck supple. No JVD present. Carotid bruit is not present. No tracheal deviation present. No thyromegaly present.  Cardiovascular: Normal rate, regular rhythm, normal heart sounds and intact distal pulses. Exam reveals no gallop and no friction rub.  No murmur heard. Pulses:      Dorsalis pedis pulses are 1+ on the right side, and 1+ on the left side.       Posterior tibial pulses are 1+ on the right side, and 1+ on the left side.  Pulmonary/Chest: Effort normal and breath sounds normal. No respiratory distress. She has no wheezes. She has no rales. She exhibits no tenderness.  Abdominal: Soft. Bowel sounds are normal. There is no tenderness.  Musculoskeletal: Normal  range of motion.       Right foot: There is normal range of motion and no deformity.       Left foot: There is normal range of motion and no deformity.  Moderate lower back pain. Patient having trouble finding comfortable position because pain is so severe. No visible or palpable abnormalities present today.  Feet:  Right Foot:  Protective Sensation: 10 sites tested. 10 sites sensed.  Left Foot:  Protective Sensation: 10 sites tested. 10 sites sensed.  Lymphadenopathy:    She has no cervical adenopathy.  Neurological: She is alert and oriented to person, place, and time. No cranial nerve deficit.  The patient is at her neurological baseline  Skin: Skin is warm and dry. Capillary refill takes less than 2 seconds. She is not diaphoretic.  Psychiatric: Her speech is normal and behavior is normal. Judgment and thought content normal. Her mood appears anxious. Cognition and memory are normal.  Nursing note and vitals reviewed.  Depression screen [facility] 2/9 12/28/2017 12/28/2017 09/20/2017 05/04/2017  Decreased Interest 0 0 0 0  Down, Depressed, Hopeless 0 0 0 0  PHQ - 2 Score 0 0 0 0    Functional Status Survey: Is the patient deaf or have difficulty hearing?: No Does the patient have difficulty seeing, even when wearing glasses/contacts?: No Does the patient have difficulty concentrating, remembering, or making decisions?: No Does the patient have difficulty walking or climbing stairs?: No Does the patient have difficulty dressing or bathing?: No Does the patient have difficulty doing errands alone such as visiting a doctor's office or shopping?: No  MMSE - [place] Exam 12/28/2017  Orientation to time 5  Orientation to Place 5  Registration 3  Attention/ Calculation 5  Recall 3  Language- name 2 objects 2  Language- repeat 1  Language- follow 3 step command 3  Language- read & follow direction 1  Write a sentence 1  Copy design 0  Total score 29    Fall Risk  12/28/2017  12/28/2017 09/20/2017 05/04/2017  Falls in the past year? - 0 No No  Number falls in past yr: 0 0 - -  Injury with Fall? 0 0 - -    Assessment/Plan: 1. Encounter for general adult medical examination with abnormal findings Annual wellness visit today  2. Acute low back pain with sciatica, sciatica laterality unspecified, unspecified back pain laterality Start medrol dose pack - take as directed for 6 days. Watch blood sugars closely as they may increase while taking medrol. Continue to take tizanidine 2mg  twice daily as needed for muscle pain/spasms. Will refer to orthopedics for  further evaluation and treatment.  - tiZANidine (ZANAFLEX) 2 MG tablet; Take 1 tablet (2 mg total) by mouth 2 (two) times daily as needed for muscle spasms.  Dispense: 45 tablet; Refill: 1 - methylPREDNISolone (MEDROL) 4 MG TBPK tablet; Take by mouth as directed for 6 days  Dispense: 21 tablet; Refill: 0 - Ambulatory referral to Orthopedic Surgery  3. Uncontrolled type 2 diabetes mellitus with hyperglycemia (HCC) - POCT HgB A1C  6.6 today. Continue diabetic medication as prescribed. Refer for diabetic eye exam.   4. Essential hypertension, benign Stable. Continue bp medication as prescribed.  5. Fatigue due to sleep pattern disturbance May take ambien 10mg  at night to help with sleep. New prescription provided today.  - zolpidem (AMBIEN) 10 MG tablet; Take 1 tablet (10 mg total) by mouth at bedtime as needed for sleep.  Dispense: 30 tablet; Refill: 3  General Counseling: Yasheka verbalizes understanding of the findings of todays visit and agrees with plan of treatment. I have discussed any further diagnostic evaluation that may be needed or ordered today. We also reviewed her medications today. she has been encouraged to call the office with any questions or concerns that should arise related to todays visit.    Counseling:  Diabetes Counseling:  1. Addition of ACE inh/ ARB'S for nephroprotection. Microalbumin  is updated  2. Diabetic foot care, prevention of complications. Podiatry consult 3. Exercise and lose weight.  4. Diabetic eye examination, Diabetic eye exam is updated  5. Monitor blood sugar closlely. nutrition counseling.  6. Sign and symptoms of hypoglycemia including shaking sweating,confusion and headaches.   This patient was seen by Leretha Pol FNP Collaboration with Dr Lavera Guise as a part of collaborative care agreement  Orders Placed This Encounter  Procedures  . Ambulatory referral to Orthopedic Surgery  . POCT HgB A1C    Meds ordered this encounter  Medications  . tiZANidine (ZANAFLEX) 2 MG tablet    Sig: Take 1 tablet (2 mg total) by mouth 2 (two) times daily as needed for muscle spasms.    Dispense:  45 tablet    Refill:  1    Order Specific Question:   Supervising Provider    Answer:   Lavera Guise [[id number]]  . zolpidem (AMBIEN) 10 MG tablet    Sig: Take 1 tablet (10 mg total) by mouth at bedtime as needed for sleep.    Dispense:  30 tablet    Refill:  3    Order Specific Question:   Supervising Provider    Answer:   Lavera Guise [[id number]]  . methylPREDNISolone (MEDROL) 4 MG TBPK tablet    Sig: Take by mouth as directed for 6 days    Dispense:  21 tablet    Refill:  0    Order Specific Question:   Supervising Provider    Answer:   Lavera Guise [[id number]]    Time spent: [place], MD  Internal Medicine

## 2017-12-29 ENCOUNTER — Telehealth: Payer: Self-pay

## 2017-12-29 NOTE — Telephone Encounter (Signed)
Spoke with phar as heather ok to fill early because pt misplace her med for dropped she is not sure she had 2 days left pt never did in past before is first as per heather pt also advised we cannot override every time

## 2017-12-30 DIAGNOSIS — M5440 Lumbago with sciatica, unspecified side: Secondary | ICD-10-CM | POA: Insufficient documentation

## 2017-12-30 DIAGNOSIS — R5383 Other fatigue: Secondary | ICD-10-CM

## 2017-12-30 DIAGNOSIS — G479 Sleep disorder, unspecified: Secondary | ICD-10-CM | POA: Insufficient documentation

## 2017-12-30 DIAGNOSIS — Z0001 Encounter for general adult medical examination with abnormal findings: Secondary | ICD-10-CM | POA: Insufficient documentation

## 2018-01-01 LAB — POCT GLYCOSYLATED HEMOGLOBIN (HGB A1C): HEMOGLOBIN A1C: 6.6 % — AB (ref 4.0–5.6)

## 2018-01-02 DIAGNOSIS — G8929 Other chronic pain: Secondary | ICD-10-CM | POA: Diagnosis not present

## 2018-01-02 DIAGNOSIS — M545 Low back pain: Secondary | ICD-10-CM | POA: Diagnosis not present

## 2018-01-19 ENCOUNTER — Telehealth: Payer: Self-pay | Admitting: Nurse Practitioner

## 2018-01-19 NOTE — Telephone Encounter (Signed)
Prior auth for medication eucrisa has been approved by insurance  Pharmacy notified

## 2018-01-29 ENCOUNTER — Other Ambulatory Visit: Payer: Self-pay

## 2018-01-29 MED ORDER — PROMETHAZINE HCL 25 MG PO TABS: 25.0000 mg | ORAL_TABLET | Freq: Two times a day (BID) | ORAL | 3 refills | Status: DC | PRN

## 2018-02-19 ENCOUNTER — Other Ambulatory Visit: Payer: Self-pay

## 2018-02-19 MED ORDER — METFORMIN HCL 500 MG PO TABS: 500.0000 mg | ORAL_TABLET | Freq: Two times a day (BID) | ORAL | 3 refills | Status: DC

## 2018-03-26 ENCOUNTER — Other Ambulatory Visit: Payer: Self-pay

## 2018-03-26 MED ORDER — LEVOTHYROXINE SODIUM 50 MCG PO TABS: 50.0000 ug | ORAL_TABLET | Freq: Every morning | ORAL | 1 refills | Status: DC

## 2018-03-26 MED ORDER — ATORVASTATIN CALCIUM 20 MG PO TABS: 20.0000 mg | ORAL_TABLET | Freq: Every day | ORAL | 1 refills | Status: DC

## 2018-03-26 MED ORDER — BUSPIRONE HCL 10 MG PO TABS: ORAL_TABLET | ORAL | 1 refills | Status: DC

## 2018-04-09 ENCOUNTER — Emergency Department: Payer: Medicare Other

## 2018-04-09 ENCOUNTER — Emergency Department
Admission: EM | Admit: 2018-04-09 | Discharge: 2018-04-09 | Disposition: A | Discharge status: Home / Self Care | Payer: Medicare Other | Attending: Emergency Medicine | Admitting: Emergency Medicine

## 2018-04-09 ENCOUNTER — Encounter: Payer: Self-pay | Admitting: Emergency Medicine

## 2018-04-09 DIAGNOSIS — R251 Tremor, unspecified: Secondary | ICD-10-CM | POA: Diagnosis present

## 2018-04-09 DIAGNOSIS — F19939 Other psychoactive substance use, unspecified with withdrawal, unspecified: Secondary | ICD-10-CM | POA: Diagnosis not present

## 2018-04-09 DIAGNOSIS — R0602 Shortness of breath: Secondary | ICD-10-CM | POA: Diagnosis not present

## 2018-04-09 DIAGNOSIS — Z7984 Long term (current) use of oral hypoglycemic drugs: Secondary | ICD-10-CM | POA: Diagnosis not present

## 2018-04-09 DIAGNOSIS — Z79899 Other long term (current) drug therapy: Secondary | ICD-10-CM | POA: Insufficient documentation

## 2018-04-09 DIAGNOSIS — E119 Type 2 diabetes mellitus without complications: Secondary | ICD-10-CM | POA: Insufficient documentation

## 2018-04-09 DIAGNOSIS — I10 Essential (primary) hypertension: Secondary | ICD-10-CM | POA: Diagnosis not present

## 2018-04-09 DIAGNOSIS — R6889 Other general symptoms and signs: Secondary | ICD-10-CM

## 2018-04-09 LAB — COMPREHENSIVE METABOLIC PANEL
ALT: 24 U/L (ref 0–44)
ANION GAP: 12 (ref 5–15)
AST: 30 U/L (ref 15–41)
Albumin: 4.2 g/dL (ref 3.5–5.0)
Alkaline Phosphatase: 54 U/L (ref 38–126)
BUN: 7 mg/dL — ABNORMAL LOW (ref 8–23)
CHLORIDE: 102 mmol/L (ref 98–111)
CO2: 22 mmol/L (ref 22–32)
Calcium: 9.8 mg/dL (ref 8.9–10.3)
Creatinine, Ser: 0.59 mg/dL (ref 0.44–1.00)
GFR calc Af Amer: >60 mL/min (ref >60)
GFR calc non Af Amer: >60 mL/min (ref >60)
Glucose, Bld: 225 mg/dL — ABNORMAL HIGH (ref 70–99)
Potassium: 3.3 mmol/L — ABNORMAL LOW (ref 3.5–5.1)
Sodium: 136 mmol/L (ref 135–145)
Total Bilirubin: 0.6 mg/dL (ref 0.3–1.2)
Total Protein: 6.8 g/dL (ref 6.5–8.1)

## 2018-04-09 LAB — TROPONIN I: Troponin I: <0.03 ng/mL (ref <0.03)

## 2018-04-09 LAB — CBC
HCT: 38.3 % (ref 36.0–46.0)
HEMOGLOBIN: 12.5 g/dL (ref 12.0–15.0)
MCH: 29.5 pg (ref 26.0–34.0)
MCHC: 32.6 g/dL (ref 30.0–36.0)
MCV: 90.3 fL (ref 80.0–100.0)
PLATELETS: 279 10*3/uL (ref 150–400)
RBC: 4.24 MIL/uL (ref 3.87–5.11)
RDW: 12 % (ref 11.5–15.5)
WBC: 9.2 10*3/uL (ref 4.0–10.5)
nRBC: 0 % (ref 0.0–0.2)

## 2018-04-09 LAB — GLUCOSE, CAPILLARY: Glucose-Capillary: 210 mg/dL — ABNORMAL HIGH (ref 70–99)

## 2018-04-09 MED ORDER — ALPRAZOLAM 0.5 MG PO TABS
0.5000 mg | ORAL_TABLET | Freq: Once | ORAL | Status: AC
  Administered 2018-04-09: 0.5 mg via ORAL
  Filled 2018-04-09: qty 1

## 2018-04-09 NOTE — ED Provider Notes (Signed)
[facility] Emergency Department Provider Note  ____________________________________________  Time seen: Approximately 10:30 AM  I have reviewed the triage vital signs and the nursing notes.   HISTORY  Chief Complaint Medication Refill; Insomnia; and Shaking   HPI [name] is a 73 y.o. female with a history of anxiety, depression, diabetes, hypertension who presents for evaluation of possible withdrawal symptoms.  Patient reports that she has been on Ambien for sleep for several years.  Last night she forgot to take it.  She has been unable to sleep.  This morning she started having tremors and felt like she was withdrawing from the medication.  She reports that she has not slept for over 24 hours now.  She denies chest pain, shortness of breath, headache, dysuria, hematuria, flulike symptoms, abdominal pain, vomiting or diarrhea.   Past Medical History:  Diagnosis Date  . Anxiety   . Depression   . Diabetes mellitus, type II ([place])   . GERD (gastroesophageal reflux disease)   . Hypertension   . Thyroid disease     Patient Active Problem List   Diagnosis Date Noted  . Encounter for general adult medical examination with abnormal findings 12/30/2017  . Acute low back pain with sciatica 12/30/2017  . Fatigue due to sleep pattern disturbance 12/30/2017  . Ovarian failure 09/27/2017  . 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 Essential hypertension, benign 08/05/2017  . Uncontrolled type 2 diabetes mellitus with hyperglycemia ([place]) 08/05/2017  . Hypothyroidism 08/05/2017  . Depression, major, single episode, severe ([place]) 08/05/2017  . Need for vaccination against Streptococcus pneumoniae using pneumococcal conjugate vaccine 13 08/05/2017  . Acute ischemic colitis ([place]) 09/12/2016  . Nausea and vomiting 09/12/2016  . Acute urinary retention 09/12/2016  . Acute renal insufficiency 09/12/2016  . Colitis  09/12/2016  . Depression, major, recurrent, moderate ([place]) 07/22/2014  . Anxiety, generalized 07/22/2014  . Insomnia, persistent 07/22/2014  . Panic disorder without agoraphobia 07/22/2014  . H/O: obesity 06/01/2014  . H/O: HTN (hypertension) 06/01/2014  . H/O gastrointestinal disease 06/01/2014  . H/O diabetes mellitus 06/01/2014  . H/O elevated lipids 06/01/2014  . History of hay fever 06/01/2014    Past Surgical History:  Procedure Laterality Date  . ABDOMINAL HYSTERECTOMY    . BREAST BIOPSY Bilateral 80s   benign    Prior to Admission medications   Medication Sig Start Date End Date Taking? Authorizing Provider  albuterol (PROVENTIL) (2.5 MG/3ML) 0.083% nebulizer solution Take 3 mLs (2.5 mg total) by nebulization every 6 (six) hours as needed for wheezing or shortness of breath. 03/16/17   Allyne Gee, MD  aspirin 81 MG tablet Take 1 tablet by mouth daily.    [provider]  atorvastatin (LIPITOR) 20 MG tablet Take 1 tablet (20 mg total) by mouth daily. 03/26/18 03/26/19  Ronnell Freshwater, NP  busPIRone (BUSPAR) 10 MG tablet Take 1/2 to 1 tablet by mouth twice daily. 03/26/18   Ronnell Freshwater, NP  citalopram (CELEXA) 40 MG tablet Take 1 tablet (40 mg total) by mouth daily. 10/24/17   Ronnell Freshwater, NP  Crisaborole (EUCRISA) 2 % OINT Apply 1 application topically 2 (two) times daily as needed. 09/22/17   Ronnell Freshwater, NP  diltiazem (CARDIZEM CD) 240 MG 24 hr capsule Take 1 capsule (240 mg total) by mouth daily. 05/04/17   Lavera Guise, MD  FLUAD 0.5 ML SUSY ADM 0.5ML IM UTD 10/20/17   [provider]  levothyroxine (SYNTHROID, LEVOTHROID) 50 MCG tablet Take 1 tablet (50 mcg total) by mouth every morning. 03/26/18   Ronnell Freshwater, NP  lisinopril (PRINIVIL,ZESTRIL) 20 MG tablet Take 1 tablet (20 mg total) by mouth daily. 05/04/17   Lavera Guise, MD  meloxicam (MOBIC) 7.5 MG tablet Take 7.5 mg by mouth 2 (two) times daily.    [provider]    metFORMIN (GLUCOPHAGE) 500 MG tablet Take 1 tablet (500 mg total) by mouth 2 (two) times daily with a meal. 02/19/18   Ronnell Freshwater, NP  methylPREDNISolone (MEDROL) 4 MG TBPK tablet Take by mouth as directed for 6 days 12/28/17   Ronnell Freshwater, NP  metoprolol tartrate (LOPRESSOR) 50 MG tablet Take 1 tablet (50 mg total) by mouth 2 (two) times daily. 09/14/16   Max Sane, MD  oxyCODONE (OXY IR/ROXICODONE) 5 MG immediate release tablet Take 1 tablet (5 mg total) by mouth 3 (three) times daily as needed for severe pain. 09/20/17   Ronnell Freshwater, NP  pantoprazole (PROTONIX) 40 MG tablet Take 1 tablet (40 mg total) by mouth 2 (two) times daily. 11/20/17   Ronnell Freshwater, NP  promethazine (PHENERGAN) 25 MG tablet Take 1 tablet (25 mg total) by mouth 2 (two) times daily as needed for nausea or vomiting. 01/29/18   Ronnell Freshwater, NP  QUEtiapine (SEROQUEL) 50 MG tablet Take 1 tablet (50 mg total) by mouth at bedtime. 05/04/17   Lavera Guise, MD  sucralfate (CARAFATE) 1 g tablet TAKE 1 TABLET BY MOUTH FOUR TIMES A DAY IF NEEDED 11/02/17   Ronnell Freshwater, NP  tiZANidine (ZANAFLEX) 2 MG tablet Take 1 tablet (2 mg total) by mouth 2 (two) times daily as needed for muscle spasms. 12/28/17   Ronnell Freshwater, NP  zolpidem (AMBIEN) 10 MG tablet Take 1 tablet (10 mg total) by mouth at bedtime as needed for sleep. 12/28/17   Ronnell Freshwater, NP    Allergies Patient has no known allergies.  Family History  Problem Relation Age of Onset  . Anxiety disorder Mother   . Colon cancer Mother   . Heart disease Father   . Anxiety disorder Father   . Obesity Sister   . Anxiety disorder Sister   . Depression Sister   . Osteoporosis Sister     Social History Social History   Tobacco Use  . Smoking status: Never Smoker  . Smokeless tobacco: Never Used  Substance Use Topics  . Alcohol use: No    Alcohol/week: 0.0 standard drinks  . Drug use: No    Review of Systems  Constitutional:  Negative for fever. Eyes: Negative for visual changes. ENT: Negative for sore throat. Neck: No neck pain  Cardiovascular: Negative for chest pain. Respiratory: Negative for shortness of breath. Gastrointestinal: Negative for abdominal pain, vomiting or diarrhea. Genitourinary: Negative for dysuria. Musculoskeletal: Negative for back pain. Skin: Negative for rash. Neurological: Negative for headaches, weakness or numbness. + shakes Psych: No SI or HI + insomnia  ____________________________________________   PHYSICAL EXAM:  VITAL SIGNS: ED Triage Vitals [04/09/18 0812]  Enc Vitals Group     BP (!) 158/70     Pulse Rate 90     Resp 20     Temp 97.8 F (36.6 C)     Temp Source Oral     SpO2 97 %     Weight 175 lb (79.4 kg)     Height 5' (1.524 m)  Head Circumference      Peak Flow      Pain Score 0     Pain Loc      Pain Edu?      Excl. in [place]?     Constitutional: Alert and oriented. Well appearing and in no apparent distress. HEENT:      Head: Normocephalic and atraumatic.         Eyes: Conjunctivae are normal. Sclera is non-icteric.       Mouth/Throat: Mucous membranes are moist.       Neck: Supple with no signs of meningismus. Cardiovascular: Regular rate and rhythm. No murmurs, gallops, or rubs. 2+ symmetrical distal pulses are present in all extremities. No JVD. Respiratory: Normal respiratory effort. Lungs are clear to auscultation bilaterally. No wheezes, crackles, or rhonchi.  Gastrointestinal: Soft, non tender, and non distended with positive bowel sounds. No rebound or guarding. Musculoskeletal: Nontender with normal range of motion in all extremities. No edema, cyanosis, or erythema of extremities. Neurologic: Normal speech and language. Face is symmetric. Moving all extremities. No gross focal neurologic deficits are appreciated. Skin: Skin is warm, dry and intact. No rash noted. Psychiatric: Mood and affect are normal. Speech and behavior are  normal.  ____________________________________________   LABS (all labs ordered are listed, but only abnormal results are displayed)  Labs Reviewed  GLUCOSE, CAPILLARY - Abnormal; Notable for the following components:      Result Value   Glucose-Capillary 210 (*)    All other components within normal limits  COMPREHENSIVE METABOLIC PANEL - Abnormal; Notable for the following components:   Potassium 3.3 (*)    Glucose, Bld 225 (*)    BUN 7 (*)    All other components within normal limits  CBC  TROPONIN I   ____________________________________________  EKG  ED ECG REPORT I, Rudene Re, the attending physician, personally viewed and interpreted this ECG.  Normal sinus rhythm, rate of 74, LBBB, borderline left axis deviation, no ST elevations or depressions.  Unchanged from prior ____________________________________________  RADIOLOGY  none  ____________________________________________   PROCEDURES  Procedure(s) performed: None Procedures Critical Care performed:  None ____________________________________________   INITIAL IMPRESSION / ASSESSMENT AND PLAN / ED COURSE  73 y.o. female with a history of anxiety, depression, diabetes, hypertension who presents for evaluation of possible withdrawal symptoms.  Patient reports insomnia and shakes this morning after forgetting to take her Ambien last night.  She has been on it for several years.  Patient denies any other symptoms at this time.  She looks well appearing and in no distress, she has normal vital signs, she is neurologically intact, she has no obvious tremors or any active signs of withdrawal on clinical evaluation.  She was provided with 0.5 mg of Xanax and reports significant improvement of her symptoms.  Labs, chest x-ray and EKG were done in triage and they are all with no acute findings.  Discussed the importance of not missing her Ambien to prevent withdrawal symptoms from happening.  Discussed standard  return precautions and follow-up.      As part of my medical decision making, I reviewed the following data within the [facility] notes reviewed and incorporated, Labs reviewed , EKG interpreted , Old chart reviewed, Radiograph reviewed , Notes from prior ED visits and [place] Controlled Substance Database    Pertinent labs & imaging results that were available during my care of the patient were reviewed by me and considered in my medical decision making (see  chart for details).    ____________________________________________   FINAL CLINICAL IMPRESSION(S) / ED DIAGNOSES  Final diagnoses:  Withdrawal complaint      NEW MEDICATIONS STARTED DURING THIS VISIT:  ED Discharge Orders    None       Note:  This document was prepared using Dragon voice recognition software and may include unintentional dictation errors.    Alfred Levins, [place], MD 04/09/18 [phone]

## 2018-04-09 NOTE — ED Notes (Signed)
Patient to Rm 12 via WC, bed in low position.  Given gown to put on.  Family member with patient.  Kim RN aware of room placement.

## 2018-04-09 NOTE — ED Triage Notes (Signed)
Pt reports no sleep in 36 hours.

## 2018-04-09 NOTE — ED Notes (Signed)
States feeling more relaxed. No resp distress.

## 2018-04-09 NOTE — ED Triage Notes (Signed)
Pt reports did not take her ambien last pm and was unable to sleep and is now shaky. Pt denies pain, SOB, or other sx.s.

## 2018-04-16 ENCOUNTER — Other Ambulatory Visit: Payer: Self-pay

## 2018-04-16 MED ORDER — SUCRALFATE 1 G PO TABS: ORAL_TABLET | ORAL | 0 refills | Status: DC

## 2018-05-01 ENCOUNTER — Ambulatory Visit: Payer: Self-pay | Admitting: Nurse Practitioner

## 2018-05-03 ENCOUNTER — Other Ambulatory Visit: Payer: Self-pay | Admitting: Nurse Practitioner

## 2018-05-03 ENCOUNTER — Telehealth: Payer: Self-pay

## 2018-05-03 ENCOUNTER — Other Ambulatory Visit: Payer: Self-pay | Admitting: Internal Medicine

## 2018-05-03 DIAGNOSIS — G479 Sleep disorder, unspecified: Secondary | ICD-10-CM

## 2018-05-03 DIAGNOSIS — R5383 Other fatigue: Principal | ICD-10-CM

## 2018-05-03 DIAGNOSIS — I10 Essential (primary) hypertension: Secondary | ICD-10-CM

## 2018-05-03 MED ORDER — ZOLPIDEM TARTRATE 10 MG PO TABS: 10.0000 mg | ORAL_TABLET | Freq: Every evening | ORAL | 1 refills | Status: DC | PRN

## 2018-05-03 NOTE — Progress Notes (Signed)
Refilled current prescription for ambien with 1 refill. Will need to be seen for office visit prior to further refills.

## 2018-05-03 NOTE — Telephone Encounter (Signed)
Refilled current prescription for ambien with 1 refill. Will need to be seen for office visit prior to further refills.

## 2018-05-03 NOTE — Telephone Encounter (Signed)
Pt advised we med but need appt for further refills

## 2018-05-23 ENCOUNTER — Other Ambulatory Visit: Payer: Self-pay

## 2018-05-23 ENCOUNTER — Encounter: Payer: Self-pay | Admitting: Nurse Practitioner

## 2018-05-23 ENCOUNTER — Ambulatory Visit (INDEPENDENT_AMBULATORY_CARE_PROVIDER_SITE_OTHER): Payer: Medicare Other | Admitting: Nurse Practitioner

## 2018-05-23 VITALS — Ht 60.0 in | Wt 170.0 lb

## 2018-05-23 DIAGNOSIS — G479 Sleep disorder, unspecified: Secondary | ICD-10-CM

## 2018-05-23 DIAGNOSIS — R5383 Other fatigue: Secondary | ICD-10-CM

## 2018-05-23 DIAGNOSIS — B3731 Acute candidiasis of vulva and vagina: Secondary | ICD-10-CM

## 2018-05-23 DIAGNOSIS — B373 Candidiasis of vulva and vagina: Secondary | ICD-10-CM

## 2018-05-23 MED ORDER — ZOLPIDEM TARTRATE 10 MG PO TABS: 10.0000 mg | ORAL_TABLET | Freq: Every evening | ORAL | 2 refills | Status: DC | PRN

## 2018-05-23 MED ORDER — FLUCONAZOLE 150 MG PO TABS: ORAL_TABLET | ORAL | 0 refills | Status: DC

## 2018-05-23 NOTE — Progress Notes (Signed)
[facility] [place], [place] [address]  Internal MEDICINE  Telephone Visit  Patient Name: [name]  [id number]  [id number]  Date of Service: 06/18/2018  I connected with the patient at 12:12pm by telephone and verified the patients identity using two identifiers.   I discussed the limitations, risks, security and privacy concerns of performing an evaluation and management service by telephone and the availability of in person appointments. I also discussed with the patient that there may be a patient responsible charge related to the service.  The patient expressed understanding and agrees to proceed.    Chief Complaint  Patient presents with  . Vaginal Itching    pt been on 7 day prednisone and showing signs of yeat infection, itching and burning, pt would like to use [facility] on [address] in [place], [place] for this visit    The patient has been contacted via telephone for follow up visit due to concerns for spread of novel coronavirus. The patient is c/o vaginal itching and white colored discharge. Was given a prednisone taper several days ago and states that itching and discharge started at that time. There is no odor present. She denies abdominal pain, nausea, vomiting, or diarrhea.       Current Medication: Outpatient Encounter Medications as of 05/23/2018  Medication Sig Note  . albuterol (PROVENTIL) (2.5 MG/3ML) 0.083% nebulizer solution Take 3 mLs (2.5 mg total) by nebulization every 6 (six) hours as needed for wheezing or shortness of breath.   Marland Kitchen aspirin 81 MG tablet Take 1 tablet by mouth daily.   Marland Kitchen atorvastatin (LIPITOR) 20 MG tablet Take 1 tablet (20 mg total) by mouth daily.   . busPIRone (BUSPAR) 10 MG tablet Take 1/2 to 1 tablet by mouth twice daily.   . citalopram (CELEXA) 40 MG tablet Take 1 tablet (40 mg total) by mouth daily.   Stasia Cavalier (EUCRISA) 2 % OINT Apply 1 application topically 2 (two) times daily as needed.   . diltiazem  (CARDIZEM CD) 240 MG 24 hr capsule TAKE 1 CAPSULE(240 MG) BY MOUTH DAILY   . FLUAD 0.5 ML SUSY ADM 0.5ML IM UTD   . levothyroxine (SYNTHROID, LEVOTHROID) 50 MCG tablet Take 1 tablet (50 mcg total) by mouth every morning.   Marland Kitchen lisinopril (PRINIVIL,ZESTRIL) 20 MG tablet Take 1 tablet (20 mg total) by mouth daily.   . meloxicam (MOBIC) 7.5 MG tablet Take 7.5 mg by mouth 2 (two) times daily. 09/12/2016: Pt has not started yet. New script  . metFORMIN (GLUCOPHAGE) 500 MG tablet Take 1 tablet (500 mg total) by mouth 2 (two) times daily with a meal.   . methylPREDNISolone (MEDROL) 4 MG TBPK tablet Take by mouth as directed for 6 days   . metoprolol tartrate (LOPRESSOR) 50 MG tablet Take 1 tablet (50 mg total) by mouth 2 (two) times daily.   Marland Kitchen oxyCODONE (OXY IR/ROXICODONE) 5 MG immediate release tablet Take 1 tablet (5 mg total) by mouth 3 (three) times daily as needed for severe pain.   . promethazine (PHENERGAN) 25 MG tablet Take 1 tablet (25 mg total) by mouth 2 (two) times daily as needed for nausea or vomiting.   . sucralfate (CARAFATE) 1 g tablet TAKE 1 TABLET BY MOUTH FOUR TIMES A DAY IF NEEDED   . tiZANidine (ZANAFLEX) 2 MG tablet Take 1 tablet (2 mg total) by mouth 2 (two) times daily as needed for muscle spasms.   Marland Kitchen zolpidem (AMBIEN) 10 MG tablet Take 1  tablet (10 mg total) by mouth at bedtime as needed for sleep.   . [DISCONTINUED] pantoprazole (PROTONIX) 40 MG tablet Take 1 tablet (40 mg total) by mouth 2 (two) times daily.   . [DISCONTINUED] QUEtiapine (SEROQUEL) 50 MG tablet Take 1 tablet (50 mg total) by mouth at bedtime.   . [DISCONTINUED] zolpidem (AMBIEN) 10 MG tablet Take 1 tablet (10 mg total) by mouth at bedtime as needed for sleep.   . fluconazole (DIFLUCAN) 150 MG tablet Take 1 tablet po once. May repeat dose in 3 days as needed for persistent symptoms.    No facility-administered encounter medications on file as of 05/23/2018.     Surgical History: Past Surgical History:  Procedure  Laterality Date  . ABDOMINAL HYSTERECTOMY    . BREAST BIOPSY Bilateral 80s   benign    Medical History: Past Medical History:  Diagnosis Date  . Anxiety   . Depression   . Diabetes mellitus, type II ([place])   . GERD (gastroesophageal reflux disease)   . Hypertension   . Thyroid disease     Family History: Family History  Problem Relation Age of Onset  . Anxiety disorder Mother   . Colon cancer Mother   . Heart disease Father   . Anxiety disorder Father   . Obesity Sister   . Anxiety disorder Sister   . Depression Sister   . Osteoporosis Sister     Social History   Socioeconomic History  . Marital status: Widowed    Spouse name: Not on file  . Number of children: Not on file  . Years of education: Not on file  . Highest education level: Not on file  Occupational History  . Not on file  Social Needs  . Financial resource strain: Not on file  . Food insecurity:    Worry: Not on file    Inability: Not on file  . Transportation needs:    Medical: Not on file    Non-medical: Not on file  Tobacco Use  . Smoking status: Never Smoker  . Smokeless tobacco: Never Used  Substance and Sexual Activity  . Alcohol use: No    Alcohol/week: 0.0 standard drinks  . Drug use: No  . Sexual activity: Never  Lifestyle  . Physical activity:    Days per week: Not on file    Minutes per session: Not on file  . Stress: Not on file  Relationships  . Social connections:    Talks on phone: Not on file    Gets together: Not on file    Attends religious service: Not on file    Active member of club or organization: Not on file    Attends meetings of clubs or organizations: Not on file    Relationship status: Not on file  . Intimate partner violence:    Fear of current or ex partner: Not on file    Emotionally abused: Not on file    Physically abused: Not on file    Forced sexual activity: Not on file  Other Topics Concern  . Not on file  Social History Narrative  . Not on  file      Review of Systems  Constitutional: Negative for chills, fatigue and unexpected weight change.  HENT: Negative for congestion, postnasal drip, rhinorrhea, sneezing and sore throat.   Respiratory: Negative for cough, chest tightness, shortness of breath and wheezing.   Cardiovascular: Negative for chest pain and palpitations.  Gastrointestinal: Negative for abdominal pain, constipation, diarrhea, nausea  and vomiting.  Genitourinary: Positive for vaginal discharge. Negative for dysuria and frequency.       White vaginal discharge. Vaginal itching and irritation.   Musculoskeletal: Negative for arthralgias, back pain, joint swelling and neck pain.  Skin: Negative for rash.  Neurological: Negative for tremors, numbness and headaches.  Hematological: Negative for adenopathy. Does not bruise/bleed easily.  Psychiatric/Behavioral: Negative for behavioral problems (Depression), sleep disturbance and suicidal ideas. The patient is not nervous/anxious.     Today's Vitals   05/23/18 1052  Weight: 170 lb (77.1 kg)  Height: 5' (1.524 m)   Body mass index is 33.2 kg/m.  Observation/Objective:   The patient is alert and oriented. She is pleasant and answers all questions appropriately. Breathing is non-labored. She is in no acute distress at this time.    Assessment/Plan: 1. Vaginal yeast infection Diflucan 150mg  tablets prescribed. Take one tablet one time. May repeat this in 3 days for persistent symptoms.  - fluconazole (DIFLUCAN) 150 MG tablet; Take 1 tablet po once. May repeat dose in 3 days as needed for persistent symptoms.  Dispense: 3 tablet; Refill: 0  2. Fatigue due to sleep pattern disturbance Renewed prescription for ambien. Take at bedtime as needed for acute insomnia. New prescription sent to her pharmacy.  - zolpidem (AMBIEN) 10 MG tablet; Take 1 tablet (10 mg total) by mouth at bedtime as needed for sleep.  Dispense: 30 tablet; Refill: 2  General Counseling:  Dorcas verbalizes understanding of the findings of today's phone visit and agrees with plan of treatment. I have discussed any further diagnostic evaluation that may be needed or ordered today. We also reviewed her medications today. she has been encouraged to call the office with any questions or concerns that should arise related to todays visit.  This patient was seen by [place] with Dr Lavera Guise as a part of collaborative care agreement  Meds ordered this encounter  Medications  . fluconazole (DIFLUCAN) 150 MG tablet    Sig: Take 1 tablet po once. May repeat dose in 3 days as needed for persistent symptoms.    Dispense:  3 tablet    Refill:  0    Order Specific Question:   Supervising Provider    Answer:   Lavera Guise [[id number]]  . zolpidem (AMBIEN) 10 MG tablet    Sig: Take 1 tablet (10 mg total) by mouth at bedtime as needed for sleep.    Dispense:  30 tablet    Refill:  2    Order Specific Question:   Supervising Provider    Answer:   Lavera Guise [[id number]]    Time spent: 21 Minutes    Dr Lavera Guise Internal medicine

## 2018-05-24 ENCOUNTER — Other Ambulatory Visit: Payer: Self-pay

## 2018-05-24 MED ORDER — PANTOPRAZOLE SODIUM 40 MG PO TBEC: 40.0000 mg | DELAYED_RELEASE_TABLET | Freq: Two times a day (BID) | ORAL | 1 refills | Status: DC

## 2018-05-29 ENCOUNTER — Other Ambulatory Visit: Payer: Self-pay | Admitting: Internal Medicine

## 2018-05-29 DIAGNOSIS — F322 Major depressive disorder, single episode, severe without psychotic features: Secondary | ICD-10-CM

## 2018-06-18 DIAGNOSIS — B373 Candidiasis of vulva and vagina: Secondary | ICD-10-CM | POA: Insufficient documentation

## 2018-06-18 DIAGNOSIS — B3731 Acute candidiasis of vulva and vagina: Secondary | ICD-10-CM | POA: Insufficient documentation

## 2018-06-30 ENCOUNTER — Other Ambulatory Visit: Payer: Self-pay | Admitting: Internal Medicine

## 2018-06-30 DIAGNOSIS — I10 Essential (primary) hypertension: Secondary | ICD-10-CM

## 2018-07-25 ENCOUNTER — Other Ambulatory Visit: Payer: Self-pay | Admitting: Nurse Practitioner

## 2018-07-25 MED ORDER — SUCRALFATE 1 G PO TABS: ORAL_TABLET | ORAL | 0 refills | Status: DC

## 2018-08-07 ENCOUNTER — Other Ambulatory Visit: Payer: Self-pay | Admitting: Nurse Practitioner

## 2018-08-07 ENCOUNTER — Other Ambulatory Visit: Payer: Self-pay

## 2018-08-07 MED ORDER — CITALOPRAM HYDROBROMIDE 40 MG PO TABS: 40.0000 mg | ORAL_TABLET | Freq: Every day | ORAL | 1 refills | Status: DC

## 2018-08-07 MED ORDER — PROMETHAZINE HCL 25 MG PO TABS: 25.0000 mg | ORAL_TABLET | Freq: Two times a day (BID) | ORAL | 3 refills | Status: DC | PRN

## 2018-08-21 ENCOUNTER — Ambulatory Visit (INDEPENDENT_AMBULATORY_CARE_PROVIDER_SITE_OTHER): Payer: Medicare Other | Admitting: Nurse Practitioner

## 2018-08-21 ENCOUNTER — Encounter: Payer: Self-pay | Admitting: Nurse Practitioner

## 2018-08-21 ENCOUNTER — Other Ambulatory Visit: Payer: Self-pay

## 2018-08-21 VITALS — BP 136/78 | HR 100 | Temp 97.3°F | Resp 16 | Ht 60.0 in | Wt 178.8 lb

## 2018-08-21 DIAGNOSIS — H60542 Acute eczematoid otitis externa, left ear: Secondary | ICD-10-CM

## 2018-08-21 DIAGNOSIS — I10 Essential (primary) hypertension: Secondary | ICD-10-CM | POA: Diagnosis not present

## 2018-08-21 DIAGNOSIS — G479 Sleep disorder, unspecified: Secondary | ICD-10-CM

## 2018-08-21 DIAGNOSIS — E1165 Type 2 diabetes mellitus with hyperglycemia: Secondary | ICD-10-CM | POA: Diagnosis not present

## 2018-08-21 DIAGNOSIS — H60549 Acute eczematoid otitis externa, unspecified ear: Secondary | ICD-10-CM | POA: Insufficient documentation

## 2018-08-21 DIAGNOSIS — R5383 Other fatigue: Secondary | ICD-10-CM

## 2018-08-21 DIAGNOSIS — M810 Age-related osteoporosis without current pathological fracture: Secondary | ICD-10-CM

## 2018-08-21 DIAGNOSIS — F322 Major depressive disorder, single episode, severe without psychotic features: Secondary | ICD-10-CM

## 2018-08-21 LAB — POCT GLYCOSYLATED HEMOGLOBIN (HGB A1C): Hemoglobin A1C: 7.4 % — AB (ref 4.0–5.6)

## 2018-08-21 MED ORDER — ACETIC ACID 2 % OT SOLN: 4.0000 [drp] | Freq: Three times a day (TID) | OTIC | 0 refills | Status: DC

## 2018-08-21 MED ORDER — ZOLPIDEM TARTRATE 10 MG PO TABS: 10.0000 mg | ORAL_TABLET | Freq: Every evening | ORAL | 3 refills | Status: DC | PRN

## 2018-08-21 MED ORDER — QUETIAPINE FUMARATE 50 MG PO TABS: 50.0000 mg | ORAL_TABLET | Freq: Two times a day (BID) | ORAL | 1 refills | Status: DC

## 2018-08-21 MED ORDER — ALENDRONATE SODIUM 35 MG PO TABS: 35.0000 mg | ORAL_TABLET | ORAL | 5 refills | Status: DC

## 2018-08-21 NOTE — Progress Notes (Signed)
[facility] [place], [place] [address]  Internal MEDICINE  Office Visit Note  Patient Name: [name]  [id number]  [id number]  Date of Service: 08/21/2018  Chief Complaint  Patient presents with  . Medical Management of Chronic Issues  . Diabetes    The patient is here for routine follow up visit. Her HgbA1c is elevated since she was last seen. States that her eating habits have been not great and she is not moving around like she should. Feels limited in what she can do because of restrictions facing her fro COVID 19. She is having panic type attacks around 2 in the afternoon. She tries to lay down to help, but panic attack gets worse and she is unable to sleep. She is sleepy the rest of the day. She asks that she be able to take a second dose of seroquel during the day. She used to do this. Helped a lot.  She states that she has severe itching in the left ear. Itches so bad  She can't scratch it enough. Hurts a little.  Finally, she did have bone density done some time ago. This did show improved, normal bone density in the spine. 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 Will start fosamax 35mg  weekly and redo bone density test in 2 years.       Current Medication: Outpatient Encounter Medications as of 08/21/2018  Medication Sig Note  . albuterol (PROVENTIL) (2.5 MG/3ML) 0.083% nebulizer solution Take 3 mLs (2.5 mg total) by nebulization every 6 (six) hours as needed for wheezing or shortness of breath.   Marland Kitchen aspirin 81 MG tablet Take 1 tablet by mouth daily.   Marland Kitchen atorvastatin (LIPITOR) 20 MG tablet Take 1 tablet (20 mg total) by mouth daily.   . busPIRone (BUSPAR) 10 MG tablet Take 1/2 to 1 tablet by mouth twice daily.   . citalopram (CELEXA) 40 MG tablet Take 1 tablet (40 mg total) by mouth daily.   Stasia Cavalier (EUCRISA) 2 % OINT Apply 1 application topically 2 (two) times daily as needed.   . diltiazem  (CARDIZEM CD) 240 MG 24 hr capsule TAKE 1 CAPSULE(240 MG) BY MOUTH DAILY   . FLUAD 0.5 ML SUSY ADM 0.5ML IM UTD   . fluconazole (DIFLUCAN) 150 MG tablet Take 1 tablet po once. May repeat dose in 3 days as needed for persistent symptoms.   Marland Kitchen levothyroxine (SYNTHROID, LEVOTHROID) 50 MCG tablet Take 1 tablet (50 mcg total) by mouth every morning.   Marland Kitchen lisinopril (ZESTRIL) 20 MG tablet TAKE 1 TABLET(20 MG) BY MOUTH DAILY   . meloxicam (MOBIC) 7.5 MG tablet Take 7.5 mg by mouth 2 (two) times daily. 09/12/2016: Pt has not started yet. New script  . metFORMIN (GLUCOPHAGE) 500 MG tablet Take 1 tablet (500 mg total) by mouth 2 (two) times daily with a meal.   . metoprolol tartrate (LOPRESSOR) 50 MG tablet Take 1 tablet (50 mg total) by mouth 2 (two) times daily.   Marland Kitchen oxyCODONE (OXY IR/ROXICODONE) 5 MG immediate release tablet Take 1 tablet (5 mg total) by mouth 3 (three) times daily as needed for severe pain.   . pantoprazole (PROTONIX) 40 MG tablet Take 1 tablet (40 mg total) by mouth 2 (two) times daily.   . promethazine (PHENERGAN) 25 MG tablet Take 1 tablet (25 mg total) by mouth 2 (two) times daily as needed for nausea or vomiting.   Marland Kitchen QUEtiapine (SEROQUEL) 50  MG tablet Take 1 tablet (50 mg total) by mouth 2 (two) times daily.   . sucralfate (CARAFATE) 1 g tablet TAKE 1 TABLET BY MOUTH FOUR TIMES A DAY IF NEEDED   . tiZANidine (ZANAFLEX) 2 MG tablet Take 1 tablet (2 mg total) by mouth 2 (two) times daily as needed for muscle spasms.   Marland Kitchen zolpidem (AMBIEN) 10 MG tablet Take 1 tablet (10 mg total) by mouth at bedtime as needed for sleep.   . [DISCONTINUED] methylPREDNISolone (MEDROL) 4 MG TBPK tablet Take by mouth as directed for 6 days   . [DISCONTINUED] QUEtiapine (SEROQUEL) 50 MG tablet TAKE 1 TABLET(50 MG) BY MOUTH AT BEDTIME   . [DISCONTINUED] zolpidem (AMBIEN) 10 MG tablet Take 1 tablet (10 mg total) by mouth at bedtime as needed for sleep.   Marland Kitchen acetic acid 2 % otic solution Place 4 drops into the right  ear 3 (three) times daily.   Marland Kitchen alendronate (FOSAMAX) 35 MG tablet Take 1 tablet (35 mg total) by mouth every 7 (seven) days. Take with a full glass of water on an empty stomach.    No facility-administered encounter medications on file as of 08/21/2018.     Surgical History: Past Surgical History:  Procedure Laterality Date  . ABDOMINAL HYSTERECTOMY    . BREAST BIOPSY Bilateral 80s   benign    Medical History: Past Medical History:  Diagnosis Date  . Anxiety   . Depression   . Diabetes mellitus, type II ([place])   . GERD (gastroesophageal reflux disease)   . Hypertension   . Thyroid disease     Family History: Family History  Problem Relation Age of Onset  . Anxiety disorder Mother   . Colon cancer Mother   . Heart disease Father   . Anxiety disorder Father   . Obesity Sister   . Anxiety disorder Sister   . Depression Sister   . Osteoporosis Sister     Social History   Socioeconomic History  . Marital status: Widowed    Spouse name: Not on file  . Number of children: Not on file  . Years of education: Not on file  . Highest education level: Not on file  Occupational History  . Not on file  Social Needs  . Financial resource strain: Not on file  . Food insecurity    Worry: Not on file    Inability: Not on file  . Transportation needs    Medical: Not on file    Non-medical: Not on file  Tobacco Use  . Smoking status: Never Smoker  . Smokeless tobacco: Never Used  Substance and Sexual Activity  . Alcohol use: No    Alcohol/week: 0.0 standard drinks  . Drug use: No  . Sexual activity: Never  Lifestyle  . Physical activity    Days per week: Not on file    Minutes per session: Not on file  . Stress: Not on file  Relationships  . Social Herbalist on phone: Not on file    Gets together: Not on file    Attends religious service: Not on file    Active member of club or organization: Not on file    Attends meetings of clubs or organizations: Not  on file    Relationship status: Not on file  . Intimate partner violence    Fear of current or ex partner: Not on file    Emotionally abused: Not on file    Physically abused: Not  on file    Forced sexual activity: Not on file  Other Topics Concern  . Not on file  Social History Narrative  . Not on file      Review of Systems  Constitutional: Positive for fatigue. Negative for activity change, chills and unexpected weight change.  HENT: Negative for congestion, postnasal drip, rhinorrhea, sneezing and sore throat.        Left ear itching and irritated .  Respiratory: Negative for cough, chest tightness, shortness of breath and wheezing.   Cardiovascular: Negative for chest pain and palpitations.  Gastrointestinal: Negative for abdominal pain, constipation, diarrhea, nausea and vomiting.  Endocrine: Negative for cold intolerance, heat intolerance, polydipsia and polyuria.       Blood sugars elevated recently. Making some poor diet choices. Also not as active due to COVID 19 restrictions.   Musculoskeletal: Positive for arthralgias, back pain and myalgias. Negative for joint swelling and neck pain.  Skin: Negative for rash.  Allergic/Immunologic: Positive for environmental allergies.  Neurological: Negative for tremors, speech difficulty, weakness, numbness and headaches.  Hematological: Negative for adenopathy. Does not bruise/bleed easily.  Psychiatric/Behavioral: Positive for dysphoric mood and sleep disturbance. Negative for behavioral problems (Depression) and suicidal ideas. The patient is nervous/anxious.    Today's Vitals   08/21/18 0947  BP: 136/78  Pulse: 100  Resp: 16  Temp: (!) 97.3 F (36.3 C)  SpO2: 95%  Weight: 178 lb 12.8 oz (81.1 kg)  Height: 5' (1.524 m)   Body mass index is 34.92 kg/m.  Physical Exam Vitals signs and nursing note reviewed.  Constitutional:      General: She is not in acute distress.    Appearance: Normal appearance. She is  well-developed. She is not diaphoretic.  HENT:     Head: Normocephalic and atraumatic.     Right Ear: Ear canal normal.     Left Ear: Tympanic membrane is erythematous.     Nose: Nose normal.     Mouth/Throat:     Pharynx: No oropharyngeal exudate.  Eyes:     Conjunctiva/sclera: Conjunctivae normal.     Pupils: Pupils are equal, round, and reactive to light.  Neck:     Musculoskeletal: Normal range of motion and neck supple.     Thyroid: No thyromegaly.     Vascular: No carotid bruit or JVD.     Trachea: No tracheal deviation.  Cardiovascular:     Rate and Rhythm: Normal rate and regular rhythm.     Heart sounds: Normal heart sounds. No murmur. No friction rub. No gallop.   Pulmonary:     Effort: Pulmonary effort is normal. No respiratory distress.     Breath sounds: Normal breath sounds. No wheezing or rales.  Chest:     Chest wall: No tenderness.  Abdominal:     General: Bowel sounds are normal.     Palpations: Abdomen is soft.     Tenderness: There is no abdominal tenderness.  Musculoskeletal: Normal range of motion.  Lymphadenopathy:     Cervical: No cervical adenopathy.  Skin:    General: Skin is warm and dry.  Neurological:     Mental Status: She is alert and oriented to person, place, and time.     Cranial Nerves: No cranial nerve deficit.  Psychiatric:        Attention and Perception: Attention and perception normal.        Mood and Affect: Mood is depressed.        Speech: Speech normal.  Behavior: Behavior normal.        Thought Content: Thought content normal.        Cognition and Memory: Cognition and memory normal.        Judgment: Judgment normal.   Assessment/Plan:  1. Uncontrolled type 2 diabetes mellitus with hyperglycemia (HCC) - POCT HgB A1C 7.4 today. Discussed importance of limiting carbs and sweets in the diet and adding physical activity into daily routine. She voiced understanding and state she would do better.   2. Acute eczematoid  otitis externa of left ear - acetic acid 2 % otic solution; Place 4 drops into the right ear 3 (three) times daily.  Dispense: 15 mL; Refill: 0  3. Essential hypertension, benign Stable. Continue bp medication as prescribed   4. Age related osteoporosis, unspecified pathological fracture presence Reviewed bone density report showing osteoporosis in femur neck and osteopenia in femur. Start alendrondate 35mg  weekly. Repeat bone density in two years. Recommended low-impact exercise as well.  - alendronate (FOSAMAX) 35 MG tablet; Take 1 tablet (35 mg total) by mouth every 7 (seven) days. Take with a full glass of water on an empty stomach.  Dispense: 4 tablet; Refill: 5  5. Depression, major, single episode, severe (HCC) Increase seroquel 50mg  back to 50mg  twice daily. Reassess at next visit.  - QUEtiapine (SEROQUEL) 50 MG tablet; Take 1 tablet (50 mg total) by mouth 2 (two) times daily.  Dispense: 180 tablet; Refill: 1  6. Fatigue due to sleep pattern disturbance May continue to take ambien 10mg  at bedtime as needed. Refill provided today.  - zolpidem (AMBIEN) 10 MG tablet; Take 1 tablet (10 mg total) by mouth at bedtime as needed for sleep.  Dispense: 30 tablet; Refill: 3  General Counseling: Emmily verbalizes understanding of the findings of todays visit and agrees with plan of treatment. I have discussed any further diagnostic evaluation that may be needed or ordered today. We also reviewed her medications today. she has been encouraged to call the office with any questions or concerns that should arise related to todays visit.  Diabetes Counseling:  1. Addition of ACE inh/ ARB'S for nephroprotection. Microalbumin is updated  2. Diabetic foot care, prevention of complications. Podiatry consult 3. Exercise and lose weight.  4. Diabetic eye examination, Diabetic eye exam is updated  5. Monitor blood sugar closlely. nutrition counseling.  6. Sign and symptoms of hypoglycemia including shaking  sweating,confusion and headaches.  This patient was seen by Leretha Pol FNP Collaboration with Dr Lavera Guise as a part of collaborative care agreement  Orders Placed This Encounter  Procedures  . POCT HgB A1C    Meds ordered this encounter  Medications  . QUEtiapine (SEROQUEL) 50 MG tablet    Sig: Take 1 tablet (50 mg total) by mouth 2 (two) times daily.    Dispense:  180 tablet    Refill:  1    Order Specific Question:   Supervising Provider    Answer:   Lavera Guise [[id number]]  . zolpidem (AMBIEN) 10 MG tablet    Sig: Take 1 tablet (10 mg total) by mouth at bedtime as needed for sleep.    Dispense:  30 tablet    Refill:  3    Order Specific Question:   Supervising Provider    Answer:   Lavera Guise [[id number]]  . acetic acid 2 % otic solution    Sig: Place 4 drops into the right ear 3 (three) times daily.    Dispense:  15 mL    Refill:  0    Order Specific Question:   Supervising Provider    Answer:   Lavera Guise [[id number]]  . alendronate (FOSAMAX) 35 MG tablet    Sig: Take 1 tablet (35 mg total) by mouth every 7 (seven) days. Take with a full glass of water on an empty stomach.    Dispense:  4 tablet    Refill:  5    Order Specific Question:   Supervising Provider    Answer:   Lavera Guise [[id number]]    Time spent: 27 Minutes      Dr Lavera Guise Internal medicine

## 2018-08-21 NOTE — Progress Notes (Signed)
Pt blood pressure elevated, taken twice 1st reading 165/102 2nd reading 162/92 Informed provider. Pt stated she had been feeling anxious from being out in public inside of the dr office.

## 2018-08-22 ENCOUNTER — Other Ambulatory Visit: Payer: Self-pay | Admitting: Nurse Practitioner

## 2018-08-22 ENCOUNTER — Telehealth: Payer: Self-pay

## 2018-08-22 DIAGNOSIS — H60542 Acute eczematoid otitis externa, left ear: Secondary | ICD-10-CM

## 2018-08-22 MED ORDER — FLUOCINOLONE ACETONIDE 0.01 % OT OIL: 5.0000 [drp] | TOPICAL_OIL | Freq: Two times a day (BID) | OTIC | 0 refills | Status: DC

## 2018-08-22 NOTE — Progress Notes (Signed)
D/c acetic acid drops due to backorder. Start fluocinolone acetonide oil. Insert 5 drops in the effected ear twice daily. New prescription sent to pharmacy.

## 2018-08-22 NOTE — Telephone Encounter (Signed)
Pt daughter was notified

## 2018-08-22 NOTE — Telephone Encounter (Signed)
Please let her know I changed the drops tfluocinolone acetonide oil. Insert 5 drops in the effected ear twice daily. New prescription sent to pharmacy. thanks

## 2018-09-05 DIAGNOSIS — M545 Low back pain: Secondary | ICD-10-CM | POA: Diagnosis not present

## 2018-09-05 DIAGNOSIS — G8929 Other chronic pain: Secondary | ICD-10-CM | POA: Diagnosis not present

## 2018-09-08 IMAGING — CR DG CHEST 2V
1 series · 2 of 2 positions shown · non-contrast
Comparison: 01/19/2016

CLINICAL DATA: Cough, follow-up pneumonia

EXAM:
CHEST  2 VIEW

[Series 1: dg chest 2 view · 0.14mm/px · 2 of 2 slices shown]
[im 1/2]
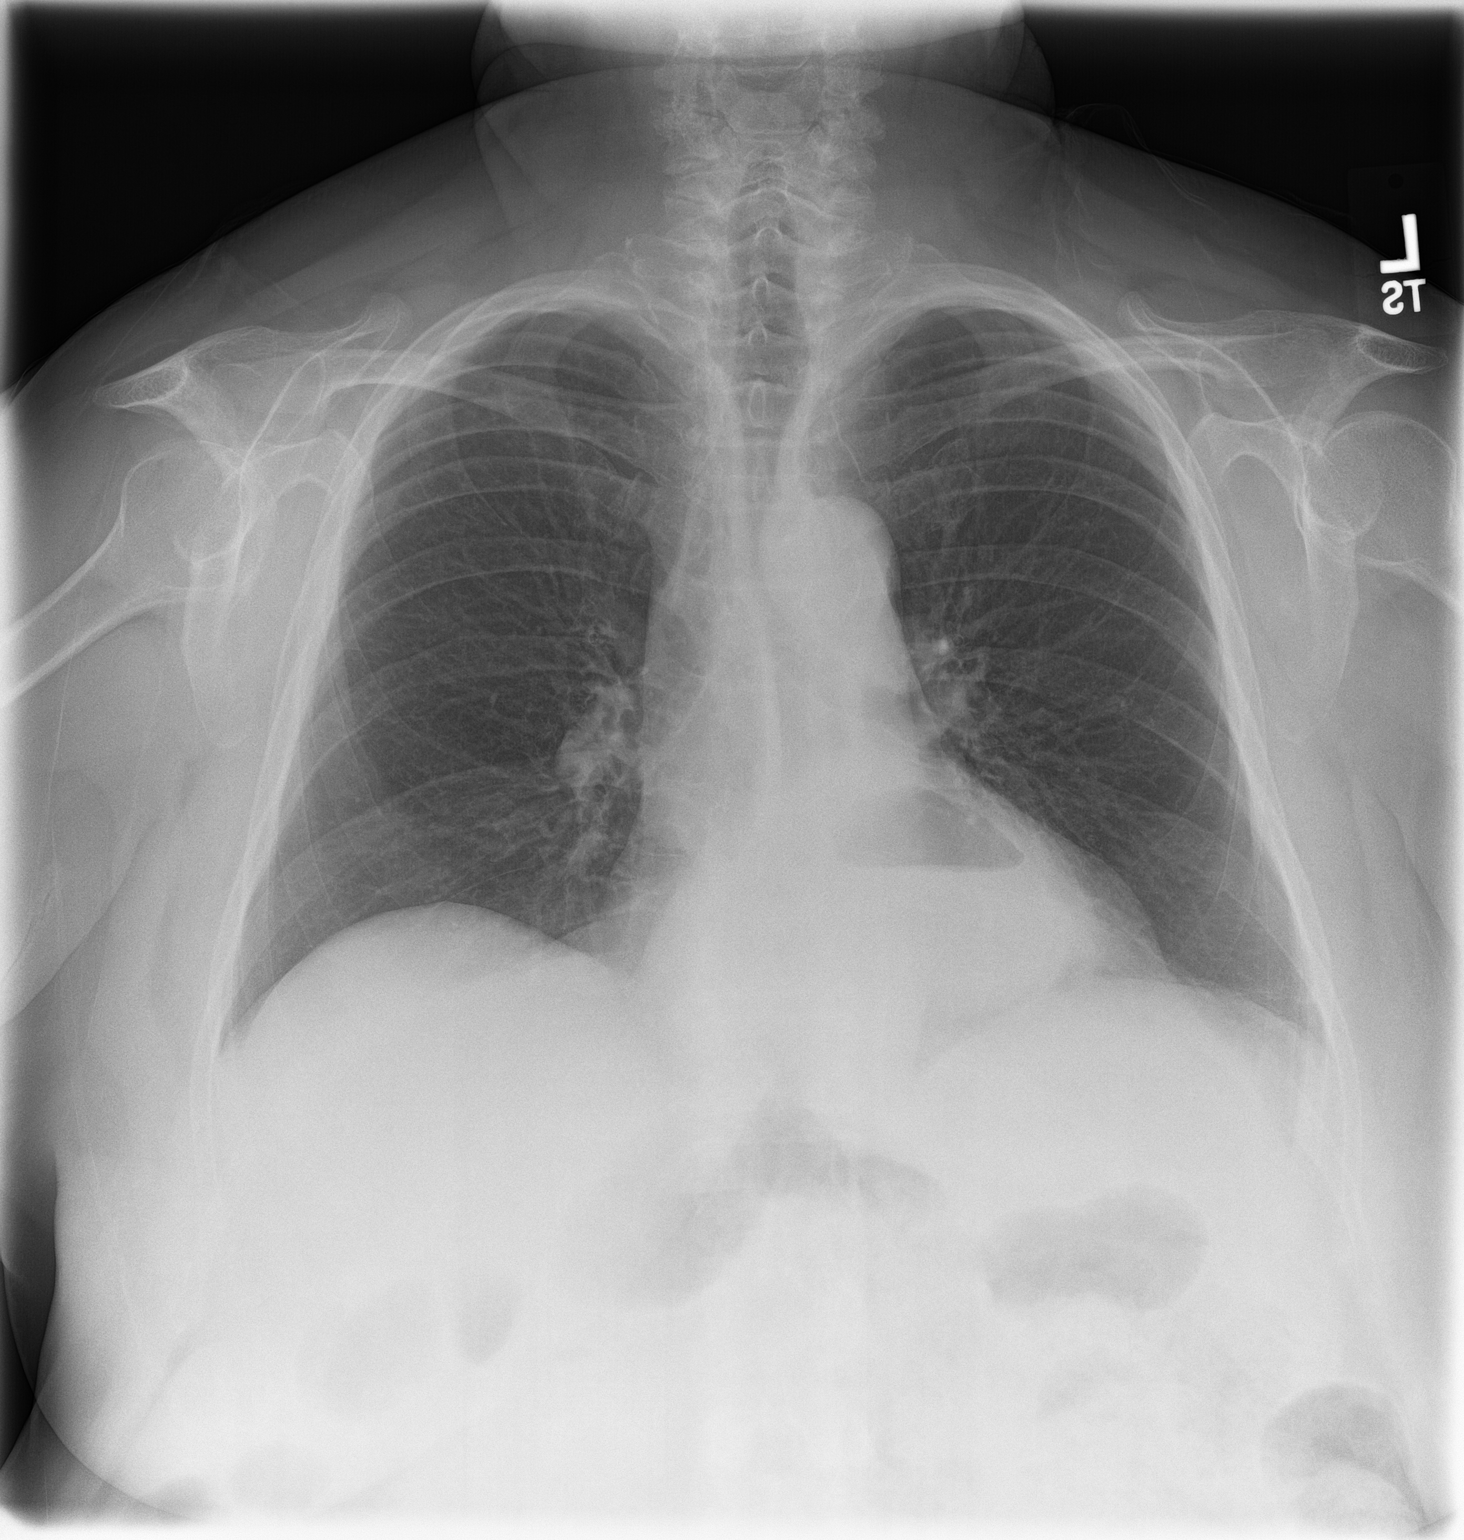
[im 2/2]
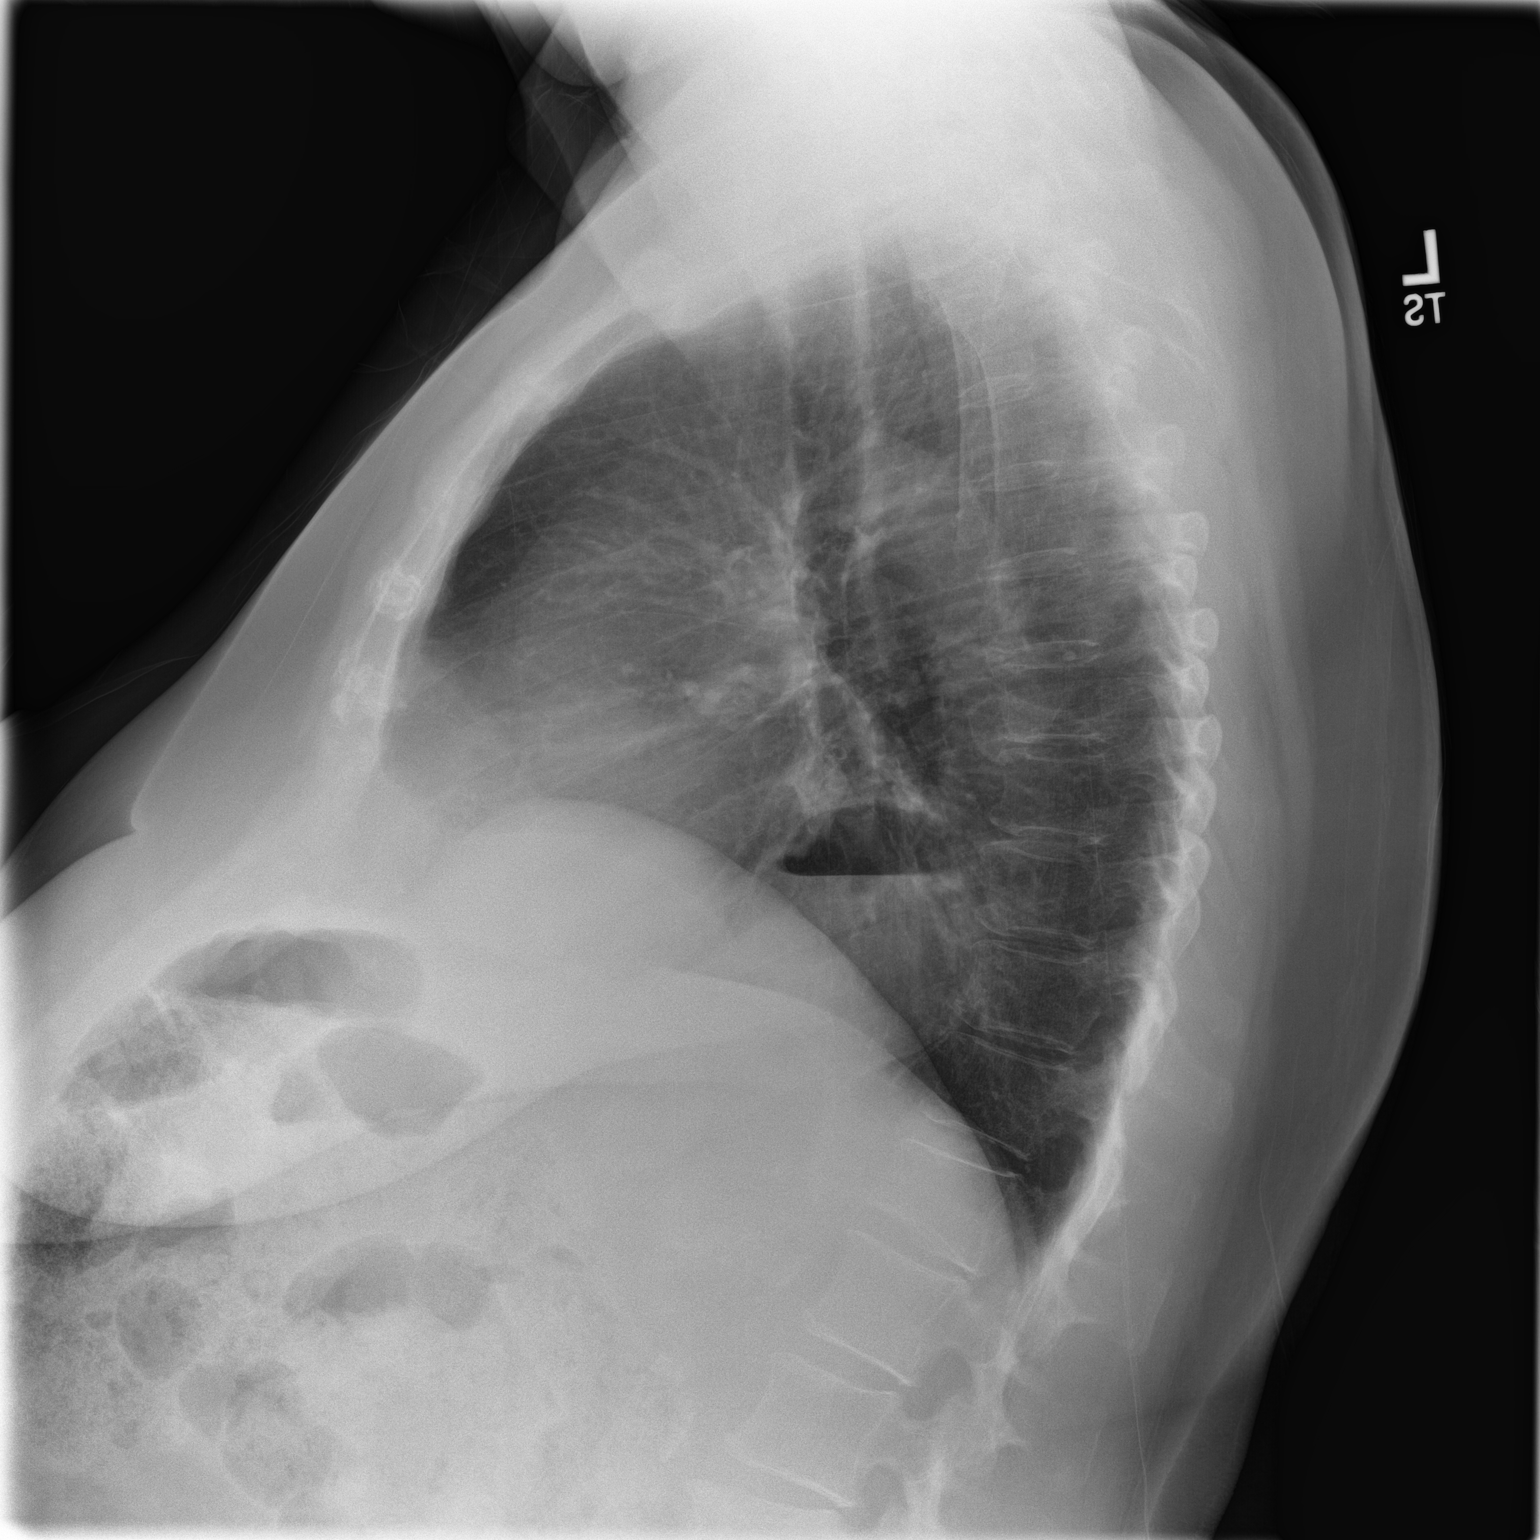

[2 of 2 positions shown; findings below may reference images not displayed]

FINDINGS: Cardiomediastinal silhouette is stable. Improvement in aeration in
left base without residual infiltrate. Again noted moderate size
hiatal hernia with air-fluid level measures at least 7 by 5.5 cm.
Mild degenerative changes thoracic spine again noted. Stable mild
compression deformity lower endplate of T12 vertebral body. No
pulmonary edema or new infiltrate.
IMPRESSION: No infiltrate or pulmonary edema. Moderate size hiatal hernia with
air-fluid level measures 7 x 5.5 cm. Stable mild compression
deformity lower endplate of T12 vertebra.

## 2018-09-17 ENCOUNTER — Other Ambulatory Visit: Payer: Self-pay | Admitting: Nurse Practitioner

## 2018-09-17 MED ORDER — ATORVASTATIN CALCIUM 20 MG PO TABS: 20.0000 mg | ORAL_TABLET | Freq: Every day | ORAL | 3 refills | Status: DC

## 2018-10-05 ENCOUNTER — Encounter: Payer: Self-pay | Admitting: Nurse Practitioner

## 2018-10-05 ENCOUNTER — Other Ambulatory Visit: Payer: Self-pay

## 2018-10-05 ENCOUNTER — Ambulatory Visit (INDEPENDENT_AMBULATORY_CARE_PROVIDER_SITE_OTHER): Payer: Medicare Other | Admitting: Nurse Practitioner

## 2018-10-05 VITALS — Ht 60.0 in | Wt 178.0 lb

## 2018-10-05 DIAGNOSIS — G2581 Restless legs syndrome: Secondary | ICD-10-CM | POA: Diagnosis not present

## 2018-10-05 MED ORDER — ROPINIROLE HCL 0.25 MG PO TABS: 0.2500 mg | ORAL_TABLET | Freq: Two times a day (BID) | ORAL | 1 refills | Status: DC

## 2018-10-05 NOTE — Progress Notes (Signed)
[facility] [place], [place] [address]  Internal MEDICINE  Telephone Visit  Patient Name: [name]  [id number]  [id number]  Date of Service: 10/05/2018  I connected with the patient at 8:31am by telephone and verified the patients identity using two identifiers.   I discussed the limitations, risks, security and privacy concerns of performing an evaluation and management service by telephone and the availability of in person appointments. I also discussed with the patient that there may be a patient responsible charge related to the service.  The patient expressed understanding and agrees to proceed.    Chief Complaint  Patient presents with  . Telephone Assessment  . Telephone Screen  . Medical Management of Chronic Issues    restless leg syndrome     The patient has been contacted via telephone for follow up visit due to concerns for spread of novel coronavirus. The patient is complaining of restless legs. Has been going on for several years. She states that this is most severe in the afternoons and evenings. She states that she just has to move her legs all around. Can't sit still. She states that by late afternoon, it really gets on her nerves.       Current Medication: Outpatient Encounter Medications as of 10/05/2018  Medication Sig Note  . albuterol (PROVENTIL) (2.5 MG/3ML) 0.083% nebulizer solution Take 3 mLs (2.5 mg total) by nebulization every 6 (six) hours as needed for wheezing or shortness of breath.   Marland Kitchen alendronate (FOSAMAX) 35 MG tablet Take 1 tablet (35 mg total) by mouth every 7 (seven) days. Take with a full glass of water on an empty stomach.   Marland Kitchen aspirin 81 MG tablet Take 1 tablet by mouth daily.   Marland Kitchen atorvastatin (LIPITOR) 20 MG tablet Take 1 tablet (20 mg total) by mouth daily.   . busPIRone (BUSPAR) 10 MG tablet Take 1/2 to 1 tablet by mouth twice daily.   . citalopram (CELEXA) 40 MG tablet Take 1 tablet (40 mg total) by mouth  daily.   Stasia Cavalier (EUCRISA) 2 % OINT Apply 1 application topically 2 (two) times daily as needed.   . diltiazem (CARDIZEM CD) 240 MG 24 hr capsule TAKE 1 CAPSULE(240 MG) BY MOUTH DAILY   . FLUAD 0.5 ML SUSY ADM 0.5ML IM UTD   . fluconazole (DIFLUCAN) 150 MG tablet Take 1 tablet po once. May repeat dose in 3 days as needed for persistent symptoms.   . Fluocinolone Acetonide 0.01 % OIL Place 5 drops in ear(s) 2 (two) times a day.   . levothyroxine (SYNTHROID, LEVOTHROID) 50 MCG tablet Take 1 tablet (50 mcg total) by mouth every morning.   Marland Kitchen lisinopril (ZESTRIL) 20 MG tablet TAKE 1 TABLET(20 MG) BY MOUTH DAILY   . meloxicam (MOBIC) 7.5 MG tablet Take 7.5 mg by mouth 2 (two) times daily. 09/12/2016: Pt has not started yet. New script  . metFORMIN (GLUCOPHAGE) 500 MG tablet Take 1 tablet (500 mg total) by mouth 2 (two) times daily with a meal.   . metoprolol tartrate (LOPRESSOR) 50 MG tablet Take 1 tablet (50 mg total) by mouth 2 (two) times daily.   Marland Kitchen oxyCODONE (OXY IR/ROXICODONE) 5 MG immediate release tablet Take 1 tablet (5 mg total) by mouth 3 (three) times daily as needed for severe pain.   . pantoprazole (PROTONIX) 40 MG tablet Take 1 tablet (40 mg total) by mouth 2 (two) times daily.   . promethazine (PHENERGAN) 25 MG tablet Take  1 tablet (25 mg total) by mouth 2 (two) times daily as needed for nausea or vomiting.   Marland Kitchen QUEtiapine (SEROQUEL) 50 MG tablet Take 1 tablet (50 mg total) by mouth 2 (two) times daily.   . sucralfate (CARAFATE) 1 g tablet TAKE 1 TABLET BY MOUTH FOUR TIMES A DAY IF NEEDED   . tiZANidine (ZANAFLEX) 2 MG tablet Take 1 tablet (2 mg total) by mouth 2 (two) times daily as needed for muscle spasms.   Marland Kitchen zolpidem (AMBIEN) 10 MG tablet Take 1 tablet (10 mg total) by mouth at bedtime as needed for sleep.   Marland Kitchen rOPINIRole (REQUIP) 0.25 MG tablet Take 1 tablet (0.25 mg total) by mouth 2 times daily at 12 noon and 4 pm.    No facility-administered encounter medications on file as of  10/05/2018.     Surgical History: Past Surgical History:  Procedure Laterality Date  . ABDOMINAL HYSTERECTOMY    . BREAST BIOPSY Bilateral 80s   benign    Medical History: Past Medical History:  Diagnosis Date  . Anxiety   . Depression   . Diabetes mellitus, type II ([place])   . GERD (gastroesophageal reflux disease)   . Hypertension   . Thyroid disease     Family History: Family History  Problem Relation Age of Onset  . Anxiety disorder Mother   . Colon cancer Mother   . Heart disease Father   . Anxiety disorder Father   . Obesity Sister   . Anxiety disorder Sister   . Depression Sister   . Osteoporosis Sister     Social History   Socioeconomic History  . Marital status: Widowed    Spouse name: Not on file  . Number of children: Not on file  . Years of education: Not on file  . Highest education level: Not on file  Occupational History  . Not on file  Social Needs  . Financial resource strain: Not on file  . Food insecurity    Worry: Not on file    Inability: Not on file  . Transportation needs    Medical: Not on file    Non-medical: Not on file  Tobacco Use  . Smoking status: Never Smoker  . Smokeless tobacco: Never Used  Substance and Sexual Activity  . Alcohol use: No    Alcohol/week: 0.0 standard drinks  . Drug use: No  . Sexual activity: Never  Lifestyle  . Physical activity    Days per week: Not on file    Minutes per session: Not on file  . Stress: Not on file  Relationships  . Social Herbalist on phone: Not on file    Gets together: Not on file    Attends religious service: Not on file    Active member of club or organization: Not on file    Attends meetings of clubs or organizations: Not on file    Relationship status: Not on file  . Intimate partner violence    Fear of current or ex partner: Not on file    Emotionally abused: Not on file    Physically abused: Not on file    Forced sexual activity: Not on file  Other  Topics Concern  . Not on file  Social History Narrative  . Not on file      Review of Systems  Constitutional: Negative for activity change, chills, fatigue and unexpected weight change.  HENT: Negative for congestion, postnasal drip, rhinorrhea, sneezing and sore throat.  Respiratory: Negative for cough, chest tightness and shortness of breath.   Cardiovascular: Negative for chest pain and palpitations.  Gastrointestinal: Negative for abdominal pain, constipation, diarrhea, nausea and vomiting.  Musculoskeletal: Negative for arthralgias, back pain, joint swelling and neck pain.  Skin: Negative for rash.  Neurological: Negative for tremors and numbness.       Patient reporting increased restless legs. Moving her legs around all the time. Can't sit still.   Hematological: Negative for adenopathy. Does not bruise/bleed easily.  Psychiatric/Behavioral: Negative for behavioral problems (Depression), sleep disturbance and suicidal ideas. The patient is not nervous/anxious.     Today's Vitals   10/05/18 0826  Weight: 178 lb (80.7 kg)  Height: 5' (1.524 m)   Body mass index is 34.76 kg/m.  Observation/Objective:   The patient is alert and oriented. She is pleasant and answers all questions appropriately. Breathing is non-labored. She is in no acute distress at this time.    Assessment/Plan:  1. Restless leg syndrome Start requeip 0.25mg  twice daily. Advised her to start with once daily. May increase to twice daily as needed and as tolerated.  - rOPINIRole (REQUIP) 0.25 MG tablet; Take 1 tablet (0.25 mg total) by mouth 2 times daily at 12 noon and 4 pm.  Dispense: 60 tablet; Refill: 1  General Counseling: Lielle verbalizes understanding of the findings of today's phone visit and agrees with plan of treatment. I have discussed any further diagnostic evaluation that may be needed or ordered today. We also reviewed her medications today. she has been encouraged to call the office with  any questions or concerns that should arise related to todays visit.  This patient was seen by [place] with Dr Lavera Guise as a part of collaborative care agreement  Meds ordered this encounter  Medications  . rOPINIRole (REQUIP) 0.25 MG tablet    Sig: Take 1 tablet (0.25 mg total) by mouth 2 times daily at 12 noon and 4 pm.    Dispense:  60 tablet    Refill:  1    Order Specific Question:   Supervising Provider    Answer:   Lavera Guise [id number]    Time spent: 71 Minutes    Dr Lavera Guise Internal medicine

## 2018-10-09 ENCOUNTER — Other Ambulatory Visit: Payer: Self-pay

## 2018-10-09 MED ORDER — LEVOTHYROXINE SODIUM 50 MCG PO TABS: 50.0000 ug | ORAL_TABLET | Freq: Every morning | ORAL | 1 refills | Status: DC

## 2018-10-09 MED ORDER — BUSPIRONE HCL 10 MG PO TABS: ORAL_TABLET | ORAL | 1 refills | Status: DC

## 2018-11-08 ENCOUNTER — Other Ambulatory Visit: Payer: Self-pay

## 2018-11-08 ENCOUNTER — Ambulatory Visit (INDEPENDENT_AMBULATORY_CARE_PROVIDER_SITE_OTHER): Payer: Medicare Other | Admitting: Nurse Practitioner

## 2018-11-08 ENCOUNTER — Encounter: Payer: Self-pay | Admitting: Nurse Practitioner

## 2018-11-08 VITALS — BP 140/76 | HR 83 | Temp 95.6°F | Resp 16 | Ht 60.0 in | Wt 178.0 lb

## 2018-11-08 DIAGNOSIS — G2581 Restless legs syndrome: Secondary | ICD-10-CM

## 2018-11-08 DIAGNOSIS — I10 Essential (primary) hypertension: Secondary | ICD-10-CM | POA: Diagnosis not present

## 2018-11-08 DIAGNOSIS — E1165 Type 2 diabetes mellitus with hyperglycemia: Secondary | ICD-10-CM

## 2018-11-08 NOTE — Progress Notes (Signed)
[facility] [place], [place] [address]  Internal MEDICINE  Office Visit Note  Patient Name: [name]  [id number]  [id number]  Date of Service: 11/08/2018  Chief Complaint  Patient presents with  . Leg Pain    started requip to help RLS    The patient was seen at her last visit for problems with restless legs and leg pain, mostly in the mornings and afternoons. She was started on ropinirole 0.25mg  twice daily. States that she is taking this only once most days. States that it has really helped to calm her legs down and keep them from hurting and cramping so much. She has not noted any negative side effects associated with taking this medication.       Current Medication: Outpatient Encounter Medications as of 11/08/2018  Medication Sig Note  . albuterol (PROVENTIL) (2.5 MG/3ML) 0.083% nebulizer solution Take 3 mLs (2.5 mg total) by nebulization every 6 (six) hours as needed for wheezing or shortness of breath.   Marland Kitchen alendronate (FOSAMAX) 35 MG tablet Take 1 tablet (35 mg total) by mouth every 7 (seven) days. Take with a full glass of water on an empty stomach.   Marland Kitchen aspirin 81 MG tablet Take 1 tablet by mouth daily.   Marland Kitchen atorvastatin (LIPITOR) 20 MG tablet Take 1 tablet (20 mg total) by mouth daily.   . busPIRone (BUSPAR) 10 MG tablet Take 1/2 to 1 tablet by mouth twice daily.   . citalopram (CELEXA) 40 MG tablet Take 1 tablet (40 mg total) by mouth daily.   Stasia Cavalier (EUCRISA) 2 % OINT Apply 1 application topically 2 (two) times daily as needed.   . diltiazem (CARDIZEM CD) 240 MG 24 hr capsule TAKE 1 CAPSULE(240 MG) BY MOUTH DAILY   . FLUAD 0.5 ML SUSY ADM 0.5ML IM UTD   . fluconazole (DIFLUCAN) 150 MG tablet Take 1 tablet po once. May repeat dose in 3 days as needed for persistent symptoms.   . Fluocinolone Acetonide 0.01 % OIL Place 5 drops in ear(s) 2 (two) times a day.   . levothyroxine (SYNTHROID) 50 MCG tablet Take 1 tablet (50 mcg total) by  mouth every morning.   Marland Kitchen lisinopril (ZESTRIL) 20 MG tablet TAKE 1 TABLET(20 MG) BY MOUTH DAILY   . meloxicam (MOBIC) 7.5 MG tablet Take 7.5 mg by mouth 2 (two) times daily. 09/12/2016: Pt has not started yet. New script  . metFORMIN (GLUCOPHAGE) 500 MG tablet Take 1 tablet (500 mg total) by mouth 2 (two) times daily with a meal.   . metoprolol tartrate (LOPRESSOR) 50 MG tablet Take 1 tablet (50 mg total) by mouth 2 (two) times daily.   Marland Kitchen oxyCODONE (OXY IR/ROXICODONE) 5 MG immediate release tablet Take 1 tablet (5 mg total) by mouth 3 (three) times daily as needed for severe pain.   . pantoprazole (PROTONIX) 40 MG tablet Take 1 tablet (40 mg total) by mouth 2 (two) times daily.   . promethazine (PHENERGAN) 25 MG tablet Take 1 tablet (25 mg total) by mouth 2 (two) times daily as needed for nausea or vomiting.   Marland Kitchen QUEtiapine (SEROQUEL) 50 MG tablet Take 1 tablet (50 mg total) by mouth 2 (two) times daily.   Marland Kitchen rOPINIRole (REQUIP) 0.25 MG tablet Take 1 tablet (0.25 mg total) by mouth 2 times daily at 12 noon and 4 pm.   . sucralfate (CARAFATE) 1 g tablet TAKE 1 TABLET BY MOUTH FOUR TIMES A DAY IF NEEDED   .  tiZANidine (ZANAFLEX) 2 MG tablet Take 1 tablet (2 mg total) by mouth 2 (two) times daily as needed for muscle spasms.   Marland Kitchen zolpidem (AMBIEN) 10 MG tablet Take 1 tablet (10 mg total) by mouth at bedtime as needed for sleep.    No facility-administered encounter medications on file as of 11/08/2018.     Surgical History: Past Surgical History:  Procedure Laterality Date  . ABDOMINAL HYSTERECTOMY    . BREAST BIOPSY Bilateral 80s   benign    Medical History: Past Medical History:  Diagnosis Date  . Anxiety   . Depression   . Diabetes mellitus, type II ([place])   . GERD (gastroesophageal reflux disease)   . Hypertension   . Thyroid disease     Family History: Family History  Problem Relation Age of Onset  . Anxiety disorder Mother   . Colon cancer Mother   . Heart disease Father   .  Anxiety disorder Father   . Obesity Sister   . Anxiety disorder Sister   . Depression Sister   . Osteoporosis Sister     Social History   Socioeconomic History  . Marital status: Widowed    Spouse name: Not on file  . Number of children: Not on file  . Years of education: Not on file  . Highest education level: Not on file  Occupational History  . Not on file  Social Needs  . Financial resource strain: Not on file  . Food insecurity    Worry: Not on file    Inability: Not on file  . Transportation needs    Medical: Not on file    Non-medical: Not on file  Tobacco Use  . Smoking status: Never Smoker  . Smokeless tobacco: Never Used  Substance and Sexual Activity  . Alcohol use: No    Alcohol/week: 0.0 standard drinks  . Drug use: No  . Sexual activity: Never  Lifestyle  . Physical activity    Days per week: Not on file    Minutes per session: Not on file  . Stress: Not on file  Relationships  . Social Herbalist on phone: Not on file    Gets together: Not on file    Attends religious service: Not on file    Active member of club or organization: Not on file    Attends meetings of clubs or organizations: Not on file    Relationship status: Not on file  . Intimate partner violence    Fear of current or ex partner: Not on file    Emotionally abused: Not on file    Physically abused: Not on file    Forced sexual activity: Not on file  Other Topics Concern  . Not on file  Social History Narrative  . Not on file      Review of Systems  Constitutional: Negative for activity change, chills, fatigue and unexpected weight change.  HENT: Negative for congestion, postnasal drip, rhinorrhea, sneezing and sore throat.   Respiratory: Negative for cough, chest tightness and shortness of breath.   Cardiovascular: Negative for chest pain and palpitations.  Gastrointestinal: Negative for abdominal pain, constipation, diarrhea, nausea and vomiting.   Musculoskeletal: Negative for arthralgias, back pain, joint swelling and neck pain.  Skin: Negative for rash.  Neurological: Negative for tremors and numbness.       Patient reporting increased restless legs. Moving her legs around all the time. Can't sit still.  This has improved significantly since her  last visit.   Hematological: Negative for adenopathy. Does not bruise/bleed easily.  Psychiatric/Behavioral: Negative for behavioral problems (Depression), sleep disturbance and suicidal ideas. The patient is not nervous/anxious.    Today's Vitals   11/08/18 1101  BP: 140/76  Pulse: 83  Resp: 16  Temp: (!) 95.6 F (35.3 C)  SpO2: 97%  Weight: 178 lb (80.7 kg)  Height: 5' (1.524 m)   Body mass index is 34.76 kg/m.  Physical Exam Vitals signs and nursing note reviewed.  Constitutional:      General: She is not in acute distress.    Appearance: Normal appearance. She is well-developed. She is not diaphoretic.  HENT:     Head: Normocephalic and atraumatic.     Mouth/Throat:     Pharynx: No oropharyngeal exudate.  Eyes:     Extraocular Movements: Extraocular movements intact.     Pupils: Pupils are equal, round, and reactive to light.  Neck:     Musculoskeletal: Normal range of motion and neck supple.     Thyroid: No thyromegaly.     Vascular: No JVD.     Trachea: No tracheal deviation.  Cardiovascular:     Rate and Rhythm: Normal rate and regular rhythm.     Heart sounds: Normal heart sounds. No murmur. No friction rub. No gallop.   Pulmonary:     Effort: Pulmonary effort is normal. No respiratory distress.     Breath sounds: Normal breath sounds. No wheezing or rales.  Chest:     Chest wall: No tenderness.  Abdominal:     Palpations: Abdomen is soft.  Musculoskeletal: Normal range of motion.  Lymphadenopathy:     Cervical: No cervical adenopathy.  Skin:    General: Skin is warm and dry.  Neurological:     Mental Status: She is alert and oriented to person, place,  and time. Mental status is at baseline.     Cranial Nerves: No cranial nerve deficit.  Psychiatric:        Behavior: Behavior normal.        Thought Content: Thought content normal.        Judgment: Judgment normal.    Assessment/Plan: 1. Restless leg syndrome Improved. Continue requip as prescribed.   2. Essential hypertension, benign Stable.   3. Uncontrolled type 2 diabetes mellitus with hyperglycemia (HCC) Stable.   General Counseling: Talibah verbalizes understanding of the findings of todays visit and agrees with plan of treatment. I have discussed any further diagnostic evaluation that may be needed or ordered today. We also reviewed her medications today. she has been encouraged to call the office with any questions or concerns that should arise related to todays visit.  This patient was seen by Leretha Pol FNP Collaboration with Dr Lavera Guise as a part of collaborative care agreement   Time spent: 46 Minutes      Dr Lavera Guise Internal medicine

## 2018-11-20 ENCOUNTER — Other Ambulatory Visit: Payer: Self-pay

## 2018-11-20 MED ORDER — PANTOPRAZOLE SODIUM 40 MG PO TBEC: 40.0000 mg | DELAYED_RELEASE_TABLET | Freq: Two times a day (BID) | ORAL | 1 refills | Status: DC

## 2018-11-27 ENCOUNTER — Other Ambulatory Visit: Payer: Self-pay | Admitting: Nurse Practitioner

## 2018-11-27 DIAGNOSIS — Z1231 Encounter for screening mammogram for malignant neoplasm of breast: Secondary | ICD-10-CM

## 2018-12-24 ENCOUNTER — Other Ambulatory Visit: Payer: Self-pay | Admitting: Internal Medicine

## 2018-12-24 DIAGNOSIS — G479 Sleep disorder, unspecified: Secondary | ICD-10-CM

## 2018-12-24 MED ORDER — ZOLPIDEM TARTRATE 10 MG PO TABS: 10.0000 mg | ORAL_TABLET | Freq: Every evening | ORAL | 0 refills | Status: DC | PRN

## 2018-12-25 ENCOUNTER — Telehealth: Payer: Self-pay

## 2018-12-25 NOTE — Telephone Encounter (Signed)
Confirmed appointment with patient. klh °

## 2018-12-26 ENCOUNTER — Ambulatory Visit (INDEPENDENT_AMBULATORY_CARE_PROVIDER_SITE_OTHER): Payer: Medicare Other | Admitting: Adult Health

## 2018-12-26 ENCOUNTER — Other Ambulatory Visit: Payer: Self-pay | Admitting: Nurse Practitioner

## 2018-12-26 ENCOUNTER — Encounter: Payer: Self-pay | Admitting: Adult Health

## 2018-12-26 ENCOUNTER — Other Ambulatory Visit: Payer: Self-pay

## 2018-12-26 ENCOUNTER — Encounter (INDEPENDENT_AMBULATORY_CARE_PROVIDER_SITE_OTHER): Payer: Self-pay

## 2018-12-26 VITALS — BP 128/67 | HR 103 | Temp 97.0°F | Resp 16 | Ht 60.0 in | Wt 179.0 lb

## 2018-12-26 DIAGNOSIS — I10 Essential (primary) hypertension: Secondary | ICD-10-CM | POA: Diagnosis not present

## 2018-12-26 DIAGNOSIS — M5441 Lumbago with sciatica, right side: Secondary | ICD-10-CM

## 2018-12-26 DIAGNOSIS — R5383 Other fatigue: Secondary | ICD-10-CM

## 2018-12-26 DIAGNOSIS — G8929 Other chronic pain: Secondary | ICD-10-CM

## 2018-12-26 DIAGNOSIS — G479 Sleep disorder, unspecified: Secondary | ICD-10-CM

## 2018-12-26 DIAGNOSIS — Z0001 Encounter for general adult medical examination with abnormal findings: Secondary | ICD-10-CM

## 2018-12-26 DIAGNOSIS — E1165 Type 2 diabetes mellitus with hyperglycemia: Secondary | ICD-10-CM | POA: Diagnosis not present

## 2018-12-26 DIAGNOSIS — R300 Dysuria: Secondary | ICD-10-CM

## 2018-12-26 DIAGNOSIS — Z1211 Encounter for screening for malignant neoplasm of colon: Secondary | ICD-10-CM

## 2018-12-26 LAB — POCT GLYCOSYLATED HEMOGLOBIN (HGB A1C): Hemoglobin A1C: 7.3 % — AB (ref 4.0–5.6)

## 2018-12-26 MED ORDER — ACCU-CHEK FASTCLIX LANCETS MISC: 1.0000 | Freq: Two times a day (BID) | 3 refills | Status: DC

## 2018-12-26 MED ORDER — ACCU-CHEK GUIDE VI STRP: ORAL_STRIP | 3 refills | Status: DC

## 2018-12-26 MED ORDER — ZOLPIDEM TARTRATE 10 MG PO TABS: 10.0000 mg | ORAL_TABLET | Freq: Every evening | ORAL | 2 refills | Status: DC | PRN

## 2018-12-26 NOTE — Progress Notes (Signed)
[facility] [place], [place] [address]  Internal MEDICINE  Office Visit Note  Patient Name: [name]  [id number]  [id number]  Date of Service: 01/13/2019  Chief Complaint  Patient presents with  . Annual Exam  . Diabetes  . Hypertension  . Gastroesophageal Reflux  . Medication Refill    ambien      HPI Pt is here for routine health maintenance examination with no reports of acute complications. Updated medication list with daughter. Annual lab work ordered at last visit and patient had labs drawn this AM before appointment, will schedule follow-up to review labs. Blood pressure and heart rate well controlled on current therapy, reports no episodes of chest pain or syncope. Moods well controlled and reports sleeping well with use of current therapy, requires Ambien nightly to help with sleeping. Chronic back pain well controlled with current therapy, takes medications on an as needed basis. Metformin currently is only therapy for diabetes, A1C performed today in office. A1C is 7.3 today, which has remained stable since July.   Reports needed a new meter to check blood sugar levels at home, sample given today and prescription sent to pharmacy for lancets and strips. Reports trying to follow a well balanced diet but slips up occasionally and dose not get regular exercise.  Current Medication: Outpatient Encounter Medications as of 12/26/2018  Medication Sig Note  . alendronate (FOSAMAX) 35 MG tablet Take 1 tablet (35 mg total) by mouth every 7 (seven) days. Take with a full glass of water on an empty stomach.   Marland Kitchen aspirin 81 MG tablet Take 1 tablet by mouth daily.   Marland Kitchen atorvastatin (LIPITOR) 20 MG tablet Take 1 tablet (20 mg total) by mouth daily.   . busPIRone (BUSPAR) 10 MG tablet Take 1/2 to 1 tablet by mouth twice daily.   . citalopram (CELEXA) 40 MG tablet Take 1 tablet (40 mg total) by mouth daily.   Stasia Cavalier (EUCRISA) 2 % OINT Apply 1  application topically 2 (two) times daily as needed.   . diltiazem (CARDIZEM CD) 240 MG 24 hr capsule TAKE 1 CAPSULE(240 MG) BY MOUTH DAILY   . FLUAD 0.5 ML SUSY ADM 0.5ML IM UTD   . Fluocinolone Acetonide 0.01 % OIL Place 5 drops in ear(s) 2 (two) times a day.   . levothyroxine (SYNTHROID) 50 MCG tablet Take 1 tablet (50 mcg total) by mouth every morning.   Marland Kitchen lisinopril (ZESTRIL) 20 MG tablet TAKE 1 TABLET(20 MG) BY MOUTH DAILY   . metFORMIN (GLUCOPHAGE) 500 MG tablet Take 1 tablet (500 mg total) by mouth 2 (two) times daily with a meal.   . metoprolol tartrate (LOPRESSOR) 50 MG tablet Take 1 tablet (50 mg total) by mouth 2 (two) times daily.   . pantoprazole (PROTONIX) 40 MG tablet Take 1 tablet (40 mg total) by mouth 2 (two) times daily.   . promethazine (PHENERGAN) 25 MG tablet Take 1 tablet (25 mg total) by mouth 2 (two) times daily as needed for nausea or vomiting.   Marland Kitchen QUEtiapine (SEROQUEL) 50 MG tablet Take 1 tablet (50 mg total) by mouth 2 (two) times daily.   Marland Kitchen rOPINIRole (REQUIP) 0.25 MG tablet Take 1 tablet (0.25 mg total) by mouth 2 times daily at 12 noon and 4 pm.   . sucralfate (CARAFATE) 1 g tablet TAKE 1 TABLET BY MOUTH FOUR TIMES A DAY IF NEEDED   . tiZANidine (ZANAFLEX) 2 MG tablet Take 1 tablet (2 mg total)  by mouth 2 (two) times daily as needed for muscle spasms.   Marland Kitchen zolpidem (AMBIEN) 10 MG tablet Take 1 tablet (10 mg total) by mouth at bedtime as needed for sleep.   . [DISCONTINUED] albuterol (PROVENTIL) (2.5 MG/3ML) 0.083% nebulizer solution Take 3 mLs (2.5 mg total) by nebulization every 6 (six) hours as needed for wheezing or shortness of breath.   . [DISCONTINUED] fluconazole (DIFLUCAN) 150 MG tablet Take 1 tablet po once. May repeat dose in 3 days as needed for persistent symptoms.   . [DISCONTINUED] meloxicam (MOBIC) 7.5 MG tablet Take 7.5 mg by mouth 2 (two) times daily. 09/12/2016: Pt has not started yet. New script  . [DISCONTINUED] oxyCODONE (OXY IR/ROXICODONE) 5 MG  immediate release tablet Take 1 tablet (5 mg total) by mouth 3 (three) times daily as needed for severe pain.   . [DISCONTINUED] zolpidem (AMBIEN) 10 MG tablet Take 1 tablet (10 mg total) by mouth at bedtime as needed for sleep.    No facility-administered encounter medications on file as of 12/26/2018.     Surgical History: Past Surgical History:  Procedure Laterality Date  . ABDOMINAL HYSTERECTOMY    . BREAST BIOPSY Bilateral 80s   benign    Medical History: Past Medical History:  Diagnosis Date  . Anxiety   . Depression   . Diabetes mellitus, type II ([place])   . GERD (gastroesophageal reflux disease)   . Hypertension   . Thyroid disease     Family History: Family History  Problem Relation Age of Onset  . Anxiety disorder Mother   . Colon cancer Mother   . Heart disease Father   . Anxiety disorder Father   . Obesity Sister   . Anxiety disorder Sister   . Depression Sister   . Osteoporosis Sister     Review of Systems  Constitutional: Negative for chills, fatigue and unexpected weight change.  HENT: Negative for congestion, rhinorrhea, sneezing and sore throat.   Eyes: Negative for photophobia, pain and redness.  Respiratory: Negative for cough, chest tightness and shortness of breath.   Cardiovascular: Negative for chest pain and palpitations.  Gastrointestinal: Negative for abdominal pain, constipation, diarrhea, nausea and vomiting.  Endocrine: Negative.   Genitourinary: Negative for dysuria and frequency.  Musculoskeletal: Negative for arthralgias, back pain, joint swelling and neck pain.  Skin: Negative for rash.  Allergic/Immunologic: Negative.   Neurological: Negative for tremors and numbness.  Hematological: Negative for adenopathy. Does not bruise/bleed easily.  Psychiatric/Behavioral: Negative for behavioral problems and sleep disturbance. The patient is not nervous/anxious.       Vital Signs: BP 128/67   Pulse (!) 103   Temp (!) 97 F (36.1 C)    Resp 16   Ht 5' (1.524 m)   Wt 179 lb (81.2 kg)   SpO2 98%   BMI 34.96 kg/m    Physical Exam Vitals signs and nursing note reviewed.  Constitutional:      General: She is not in acute distress.    Appearance: She is well-developed. She is not diaphoretic.  HENT:     Head: Normocephalic and atraumatic.     Mouth/Throat:     Pharynx: No oropharyngeal exudate.  Eyes:     Pupils: Pupils are equal, round, and reactive to light.  Neck:     Musculoskeletal: Normal range of motion and neck supple.     Thyroid: No thyromegaly.     Vascular: No JVD.     Trachea: No tracheal deviation.  Cardiovascular:  Rate and Rhythm: Normal rate and regular rhythm.     Heart sounds: Normal heart sounds. No murmur. No friction rub. No gallop.   Pulmonary:     Effort: Pulmonary effort is normal. No respiratory distress.     Breath sounds: Normal breath sounds. No wheezing or rales.  Chest:     Chest wall: No tenderness.  Abdominal:     Palpations: Abdomen is soft.     Tenderness: There is no abdominal tenderness. There is no guarding.  Musculoskeletal: Normal range of motion.  Lymphadenopathy:     Cervical: No cervical adenopathy.  Skin:    General: Skin is warm and dry.  Neurological:     Mental Status: She is alert and oriented to person, place, and time.     Cranial Nerves: No cranial nerve deficit.  Psychiatric:        Behavior: Behavior normal.        Thought Content: Thought content normal.        Judgment: Judgment normal.      LABS: Recent Results (from the past 2160 hour(s))  Comprehensive metabolic panel     Status: Abnormal   Collection Time: 12/26/18  9:28 AM  Result Value Ref Range   Glucose 168 (H) 65 - 99 mg/dL   BUN 6 (L) 8 - 27 mg/dL   Creatinine, Ser 0.77 0.57 - 1.00 mg/dL   GFR calc non Af Amer 76 >59 mL/min/1.73   GFR calc Af Amer 88 >59 mL/min/1.73   BUN/Creatinine Ratio 8 (L) 12 - 28   Sodium 136 134 - 144 mmol/L   Potassium 4.8 3.5 - 5.2 mmol/L    Chloride 97 96 - 106 mmol/L   CO2 24 20 - 29 mmol/L   Calcium 10.0 8.7 - 10.3 mg/dL   Total Protein 6.1 6.0 - 8.5 g/dL   Albumin 4.5 3.7 - 4.7 g/dL   Globulin, Total 1.6 1.5 - 4.5 g/dL   Albumin/Globulin Ratio 2.8 (H) 1.2 - 2.2   Bilirubin Total 0.4 0.0 - 1.2 mg/dL   Alkaline Phosphatase 72 39 - 117 IU/L   AST 34 0 - 40 IU/L   ALT 37 (H) 0 - 32 IU/L  CBC     Status: None   Collection Time: 12/26/18  9:28 AM  Result Value Ref Range   WBC 9.0 3.4 - 10.8 x10E3/uL   RBC 4.16 3.77 - 5.28 x10E6/uL   Hemoglobin 12.4 11.1 - 15.9 g/dL   Hematocrit 36.3 34.0 - 46.6 %   MCV 87 79 - 97 fL   MCH 29.8 26.6 - 33.0 pg   MCHC 34.2 31.5 - 35.7 g/dL   RDW 12.2 11.7 - 15.4 %   Platelets 302 150 - 450 x10E3/uL  Lipid Panel w/o Chol/HDL Ratio     Status: Abnormal   Collection Time: 12/26/18  9:28 AM  Result Value Ref Range   Cholesterol, Total 117 100 - 199 mg/dL   Triglycerides 130 0 - 149 mg/dL   HDL 36 (L) >39 mg/dL   VLDL Cholesterol Cal 23 5 - 40 mg/dL   LDL Chol Calc (NIH) 58 0 - 99 mg/dL  Iron and TIBC     Status: None   Collection Time: 12/26/18  9:28 AM  Result Value Ref Range   Total Iron Binding Capacity 323 250 - 450 ug/dL   UIBC 249 118 - 369 ug/dL   Iron 74 27 - 139 ug/dL   Iron Saturation 23 15 - 55 %  B12 and Folate Panel     Status: None   Collection Time: 12/26/18  9:28 AM  Result Value Ref Range   Vitamin B-12 632 232 - 1,245 pg/mL   Folate >20.0 >3.0 ng/mL    Comment: A serum folate concentration of less than 3.1 ng/mL is considered to represent clinical deficiency.   T4, free     Status: None   Collection Time: 12/26/18  9:28 AM  Result Value Ref Range   Free T4 1.19 0.82 - 1.77 ng/dL  TSH     Status: None   Collection Time: 12/26/18  9:28 AM  Result Value Ref Range   TSH 1.150 0.450 - 4.500 uIU/mL  VITAMIN D 25 Hydroxy (Vit-D Deficiency, Fractures)     Status: None   Collection Time: 12/26/18  9:28 AM  Result Value Ref Range   Vit D, 25-Hydroxy 46.1 30.0 -  100.0 ng/mL    Comment: Vitamin D deficiency has been defined by the [place] practice guideline as a level of serum 25-OH vitamin D less than 20 ng/mL (1,2). The Endocrine Society went on to further define vitamin D insufficiency as a level between 21 and 29 ng/mL (2). 1. IOM (Institute of Medicine). 2010. Dietary reference    intakes for calcium and D. [place]: The    [employer]. 2. Holick MF, Binkley [place], Bischoff-Ferrari HA, et al.    Evaluation, treatment, and prevention of vitamin D    deficiency: an Endocrine Society clinical practice    guideline. JCEM. 2011 Jul; 96(7):1911-30.   Ferritin     Status: None   Collection Time: 12/26/18  9:28 AM  Result Value Ref Range   Ferritin 37 15 - 150 ng/mL  UA/M w/rflx Culture, Routine     Status: Abnormal   Collection Time: 12/26/18  9:59 AM   Specimen: Urine   URINE  Result Value Ref Range   Specific Gravity, UA 1.008 1.005 - 1.030   pH, UA 6.0 5.0 - 7.5   Color, UA Yellow Yellow   Appearance Ur Clear Clear   Leukocytes,UA Trace (A) Negative   Protein,UA Negative Negative/Trace   Glucose, UA Negative Negative   Ketones, UA Negative Negative   RBC, UA Negative Negative   Bilirubin, UA Negative Negative   Urobilinogen, Ur 0.2 0.2 - 1.0 mg/dL   Nitrite, UA Negative Negative   Microscopic Examination See below:     Comment: Microscopic was indicated and was performed.   Urinalysis Reflex Comment     Comment: This specimen has reflexed to a Urine Culture.  Microscopic Examination     Status: None   Collection Time: 12/26/18  9:59 AM   URINE  Result Value Ref Range   WBC, UA 0-5 0 - 5 /hpf   RBC 0-2 0 - 2 /hpf   Epithelial Cells (non renal) 0-10 0 - 10 /hpf   Casts None seen None seen /lpf   Bacteria, UA Few None seen/Few  Urine Culture, Reflex     Status: Abnormal   Collection Time: 12/26/18  9:59 AM   URINE  Result Value Ref Range   Urine Culture, Routine Final  report (A)    Organism ID, Bacteria Escherichia coli (A)     Comment: Greater than 100,000 colony forming units per mL   Antimicrobial Susceptibility Comment     Comment:       ** S = Susceptible; I = Intermediate; R = Resistant **  P = Positive; N = Negative             MICS are expressed in micrograms per mL    Antibiotic                 RSLT#1    RSLT#2    RSLT#3    RSLT#4 Amoxicillin/Clavulanic Acid    I Ampicillin                     R Cefazolin                      S Cefepime                       S Ceftriaxone                    S Cefuroxime                     I Ciprofloxacin                  S Ertapenem                      S Gentamicin                     S Imipenem                       S Levofloxacin                   S Meropenem                      S Nitrofurantoin                 S Piperacillin/Tazobactam        S Tetracycline                   S Tobramycin                     S Trimethoprim/Sulfa             S   POCT HgB A1C     Status: Abnormal   Collection Time: 12/26/18 10:45 AM  Result Value Ref Range   Hemoglobin A1C 7.3 (A) 4.0 - 5.6 %   HbA1c POC (<> result, manual entry)     HbA1c, POC (prediabetic range)     HbA1c, POC (controlled diabetic range)       Assessment/Plan: 1. Encounter for general adult medical examination with abnormal findings Well appearing 73 year old female in no acute distress. Influenza vaccine given in summer, referral made for colonoscopy, appointment already in place for mammogram, otherwise up to date on PHM.  2. Type 2 diabetes mellitus with hyperglycemia, without long-term current use of insulin (HCC) Stable on current therapy, A1C today 7.3, continue to monitor. - POCT HgB A1C  3. Chronic right-sided low back pain with right-sided sciatica History of injury/trauma to back, has intermittent flares of pain that are well controlled on current as needed therapies, continue to monitor.  4. Essential  hypertension Stable on current therapy, continue to monitor.  5. Fatigue due to sleep pattern disturbance Stable on current therapy, continue to monitor. - zolpidem (AMBIEN) 10 MG tablet; Take 1 tablet (10 mg total) by mouth at bedtime  as needed for sleep.  Dispense: 30 tablet; Refill: 2  6. Screening for colon cancer - Ambulatory referral to Gastroenterology  7. Dysuria - UA/M w/rflx Culture, Routine   General Counseling: Analiese verbalizes understanding of the findings of todays visit and agrees with plan of treatment. I have discussed any further diagnostic evaluation that may be needed or ordered today. We also reviewed her medications today. she has been encouraged to call the office with any questions or concerns that should arise related to todays visit.   Orders Placed This Encounter  Procedures  . Microscopic Examination  . Urine Culture, Reflex  . UA/M w/rflx Culture, Routine  . Ambulatory referral to Gastroenterology  . POCT HgB A1C    Meds ordered this encounter  Medications  . zolpidem (AMBIEN) 10 MG tablet    Sig: Take 1 tablet (10 mg total) by mouth at bedtime as needed for sleep.    Dispense:  30 tablet    Refill:  2    Time spent: 30 Minutes   This patient was seen by Orson Gear AGNP-C in Collaboration with Dr Lavera Guise as a part of collaborative care agreement    Kendell Bane AGNP-C Internal Medicine

## 2018-12-27 ENCOUNTER — Telehealth: Payer: Self-pay | Admitting: Gastroenterology

## 2018-12-27 LAB — IRON AND TIBC
Iron Saturation: 23 % (ref 15–55)
Iron: 74 ug/dL (ref 27–139)
Total Iron Binding Capacity: 323 ug/dL (ref 250–450)
UIBC: 249 ug/dL (ref 118–369)

## 2018-12-27 LAB — COMPREHENSIVE METABOLIC PANEL
ALT: 37 IU/L — ABNORMAL HIGH (ref 0–32)
AST: 34 IU/L (ref 0–40)
Albumin/Globulin Ratio: 2.8 — ABNORMAL HIGH (ref 1.2–2.2)
Albumin: 4.5 g/dL (ref 3.7–4.7)
Alkaline Phosphatase: 72 IU/L (ref 39–117)
BUN/Creatinine Ratio: 8 — ABNORMAL LOW (ref 12–28)
BUN: 6 mg/dL — ABNORMAL LOW (ref 8–27)
Bilirubin Total: 0.4 mg/dL (ref 0.0–1.2)
CO2: 24 mmol/L (ref 20–29)
Calcium: 10 mg/dL (ref 8.7–10.3)
Chloride: 97 mmol/L (ref 96–106)
Creatinine, Ser: 0.77 mg/dL (ref 0.57–1.00)
GFR calc Af Amer: 88 mL/min/{1.73_m2} (ref >59)
GFR calc non Af Amer: 76 mL/min/{1.73_m2} (ref >59)
Globulin, Total: 1.6 g/dL (ref 1.5–4.5)
Glucose: 168 mg/dL — ABNORMAL HIGH (ref 65–99)
Potassium: 4.8 mmol/L (ref 3.5–5.2)
Sodium: 136 mmol/L (ref 134–144)
Total Protein: 6.1 g/dL (ref 6.0–8.5)

## 2018-12-27 LAB — LIPID PANEL W/O CHOL/HDL RATIO
Cholesterol, Total: 117 mg/dL (ref 100–199)
HDL: 36 mg/dL — ABNORMAL LOW (ref >39)
LDL Chol Calc (NIH): 58 mg/dL (ref 0–99)
Triglycerides: 130 mg/dL (ref 0–149)
VLDL Cholesterol Cal: 23 mg/dL (ref 5–40)

## 2018-12-27 LAB — CBC
Hematocrit: 36.3 % (ref 34.0–46.6)
Hemoglobin: 12.4 g/dL (ref 11.1–15.9)
MCH: 29.8 pg (ref 26.6–33.0)
MCHC: 34.2 g/dL (ref 31.5–35.7)
MCV: 87 fL (ref 79–97)
Platelets: 302 10*3/uL (ref 150–450)
RBC: 4.16 x10E6/uL (ref 3.77–5.28)
RDW: 12.2 % (ref 11.7–15.4)
WBC: 9 10*3/uL (ref 3.4–10.8)

## 2018-12-27 LAB — T4, FREE: Free T4: 1.19 ng/dL (ref 0.82–1.77)

## 2018-12-27 LAB — VITAMIN D 25 HYDROXY (VIT D DEFICIENCY, FRACTURES): Vit D, 25-Hydroxy: 46.1 ng/mL (ref 30.0–100.0)

## 2018-12-27 LAB — FERRITIN: Ferritin: 37 ng/mL (ref 15–150)

## 2018-12-27 LAB — TSH: TSH: 1.15 u[IU]/mL (ref 0.450–4.500)

## 2018-12-27 LAB — B12 AND FOLATE PANEL
Folate: >20 ng/mL (ref >3.0)
Vitamin B-12: 632 pg/mL (ref 232–1245)

## 2018-12-27 NOTE — Telephone Encounter (Signed)
Gastroenterology Pre-Procedure Review  Request Date: PENDING [facility] 2021 Requesting Physician: Dr. PENDING  PATIENT REVIEW QUESTIONS: The patient responded to the following health history questions as indicated:    1. Are you having any GI issues? no 2. Do you have a personal history of Polyps? no 3. Do you have a family history of Colon Cancer or Polyps? yes (mother colon cancer) 4. Diabetes Mellitus? yes (oral meds) 5. Joint replacements in the past 12 months?no 6. Major health problems in the past 3 months?no 7. Any artificial heart valves, MVP, or defibrillator?no    MEDICATIONS & ALLERGIES:    Patient reports the following regarding taking any anticoagulation/antiplatelet therapy:   Plavix, Coumadin, Eliquis, Xarelto, Lovenox, Pradaxa, Brilinta, or Effient? no Aspirin? yes (81 mg daily)  Patient confirms/reports the following medications:  Current Outpatient Medications  Medication Sig Dispense Refill  . Accu-Chek FastClix Lancets MISC 1 each by Does not apply route 2 (two) times daily. Use as directed twice daily diag 111.65 100 each 3  . alendronate (FOSAMAX) 35 MG tablet Take 1 tablet (35 mg total) by mouth every 7 (seven) days. Take with a full glass of water on an empty stomach. 4 tablet 5  . aspirin 81 MG tablet Take 1 tablet by mouth daily.    Marland Kitchen atorvastatin (LIPITOR) 20 MG tablet Take 1 tablet (20 mg total) by mouth daily. 90 tablet 3  . busPIRone (BUSPAR) 10 MG tablet Take 1/2 to 1 tablet by mouth twice daily. 180 tablet 1  . citalopram (CELEXA) 40 MG tablet Take 1 tablet (40 mg total) by mouth daily. 90 tablet 1  . Crisaborole (EUCRISA) 2 % OINT Apply 1 application topically 2 (two) times daily as needed. 60 g 2  . diltiazem (CARDIZEM CD) 240 MG 24 hr capsule TAKE 1 CAPSULE(240 MG) BY MOUTH DAILY 90 capsule 3  . FLUAD 0.5 ML SUSY ADM 0.5ML IM UTD  0  . Fluocinolone Acetonide 0.01 % OIL Place 5 drops in ear(s) 2 (two) times a day. 20 mL 0  . glucose blood  (ACCU-CHEK GUIDE) test strip Use as directed twice a day diag e11.65 100 each 3  . levothyroxine (SYNTHROID) 50 MCG tablet Take 1 tablet (50 mcg total) by mouth every morning. 90 tablet 1  . lisinopril (ZESTRIL) 20 MG tablet TAKE 1 TABLET(20 MG) BY MOUTH DAILY 90 tablet 3  . metFORMIN (GLUCOPHAGE) 500 MG tablet Take 1 tablet (500 mg total) by mouth 2 (two) times daily with a meal. 180 tablet 3  . metoprolol tartrate (LOPRESSOR) 50 MG tablet Take 1 tablet (50 mg total) by mouth 2 (two) times daily. 60 tablet 0  . pantoprazole (PROTONIX) 40 MG tablet Take 1 tablet (40 mg total) by mouth 2 (two) times daily. 180 tablet 1  . promethazine (PHENERGAN) 25 MG tablet Take 1 tablet (25 mg total) by mouth 2 (two) times daily as needed for nausea or vomiting. 30 tablet 3  . QUEtiapine (SEROQUEL) 50 MG tablet Take 1 tablet (50 mg total) by mouth 2 (two) times daily. 180 tablet 1  . rOPINIRole (REQUIP) 0.25 MG tablet Take 1 tablet (0.25 mg total) by mouth 2 times daily at 12 noon and 4 pm. 60 tablet 1  . sucralfate (CARAFATE) 1 g tablet TAKE 1 TABLET BY MOUTH FOUR TIMES A DAY IF NEEDED 360 tablet 0  . tiZANidine (ZANAFLEX) 2 MG tablet Take 1 tablet (2 mg total) by mouth 2 (two) times daily as needed for muscle spasms. [place]  tablet 1  . zolpidem (AMBIEN) 10 MG tablet Take 1 tablet (10 mg total) by mouth at bedtime as needed for sleep. 30 tablet 2   No current facility-administered medications for this visit.     Patient confirms/reports the following allergies:  No Known Allergies  No orders of the defined types were placed in this encounter.   AUTHORIZATION INFORMATION Primary Insurance: 1D#: Group #:  Secondary Insurance: 1D#: Group #:  SCHEDULE INFORMATION: Date: PENDING Time: Location:[facility]

## 2018-12-27 NOTE — Telephone Encounter (Signed)
Pt daughter is calling to schedule a colonoscopy

## 2018-12-29 LAB — UA/M W/RFLX CULTURE, ROUTINE
Bilirubin, UA: NEGATIVE
Glucose, UA: NEGATIVE
Ketones, UA: NEGATIVE
Nitrite, UA: NEGATIVE
Protein,UA: NEGATIVE
RBC, UA: NEGATIVE
Specific Gravity, UA: 1.008 (ref 1.005–1.030)
Urobilinogen, Ur: 0.2 mg/dL (ref 0.2–1.0)
pH, UA: 6 (ref 5.0–7.5)

## 2018-12-29 LAB — MICROSCOPIC EXAMINATION: Casts: NONE SEEN /lpf

## 2018-12-29 LAB — URINE CULTURE, REFLEX

## 2019-01-01 ENCOUNTER — Ambulatory Visit: Payer: Self-pay | Admitting: Adult Health

## 2019-01-01 NOTE — Progress Notes (Signed)
Overall, labs look good. Discuss with patient at next visit

## 2019-01-08 ENCOUNTER — Encounter: Payer: Self-pay | Admitting: *Deleted

## 2019-01-14 ENCOUNTER — Other Ambulatory Visit: Payer: Self-pay

## 2019-01-14 DIAGNOSIS — Z20822 Contact with and (suspected) exposure to covid-19: Secondary | ICD-10-CM

## 2019-01-15 LAB — NOVEL CORONAVIRUS, NAA: SARS-CoV-2, NAA: NOT DETECTED

## 2019-01-16 ENCOUNTER — Telehealth: Payer: Self-pay

## 2019-01-16 ENCOUNTER — Other Ambulatory Visit: Payer: Self-pay | Admitting: Nurse Practitioner

## 2019-01-16 DIAGNOSIS — N390 Urinary tract infection, site not specified: Secondary | ICD-10-CM

## 2019-01-16 MED ORDER — SULFAMETHOXAZOLE-TRIMETHOPRIM 800-160 MG PO TABS: 1.0000 | ORAL_TABLET | Freq: Two times a day (BID) | ORAL | 0 refills | Status: DC

## 2019-01-16 NOTE — Progress Notes (Signed)
uti present on urine culture results from her CPE. I have sent Bactrim DS twice dialy for next 5 days. Prescription sent to her pharmacy.

## 2019-01-16 NOTE — Progress Notes (Signed)
Hey. Can you let the patient know that I have just received urine culture results from her wellness visit. uti present. I have sent Bactrim DS twice dialy for next 5 days. Prescription sent to her pharmacy. thanks

## 2019-01-16 NOTE — Telephone Encounter (Signed)
-----   Message from Ronnell Freshwater, NP sent at 01/16/2019 10:42 AM EST ----- Hey. Can you let the patient know that I have just received urine culture results from her wellness visit. uti present. I have sent Bactrim DS twice dialy for next 5 days. Prescription sent to her pharmacy. thanks

## 2019-01-16 NOTE — Telephone Encounter (Signed)
Pt daughter advised that urine showed uti we send bactrim for 5 days

## 2019-01-17 IMAGING — CT CT ABD-PELV W/ CM
2 of 5 series · 16 of 46 positions shown, 18 images · IV contrast (APPLIED)
Comparison: CT scan of April 25, 2016.

CLINICAL DATA: Generalized abdominal pain, vomiting, diarrhea.

EXAM:
CT ABDOMEN AND PELVIS WITH CONTRAST
TECHNIQUE: Multidetector CT imaging of the abdomen and pelvis was performed
using the standard protocol following bolus administration of
intravenous contrast.
CONTRAST:  75mL 6LQAY5-211 IOPAMIDOL (6LQAY5-211) INJECTION 61%

[Series 2: routine abd/pel with · axial · 0.78mm/px · z∈[-995,-555]mm · 13 of 100 slices shown, 15 images]
[im 6/100  soft-tissue]
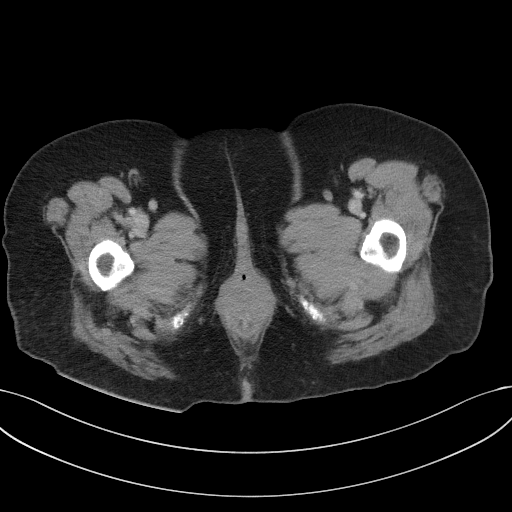
[im 6/100  bone]
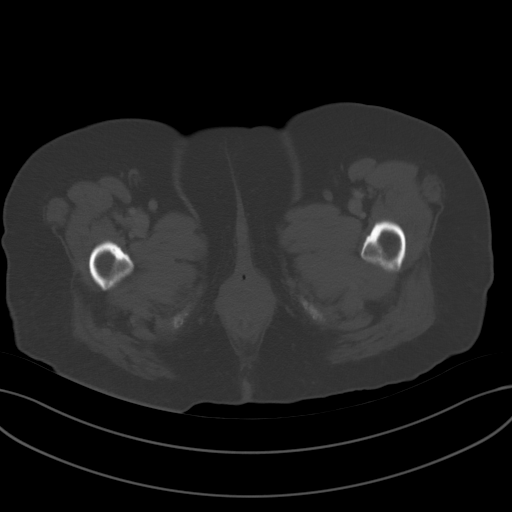
[im 16/100  soft-tissue]
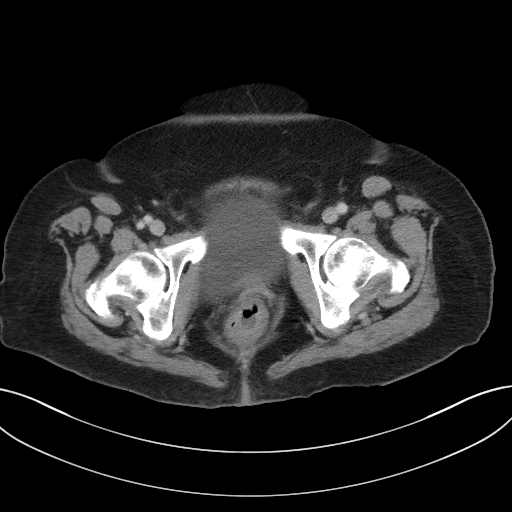
[im 21/100  soft-tissue]
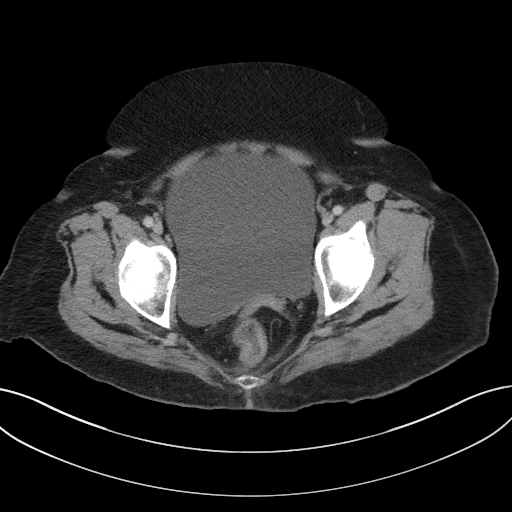
[im 27/100  soft-tissue]
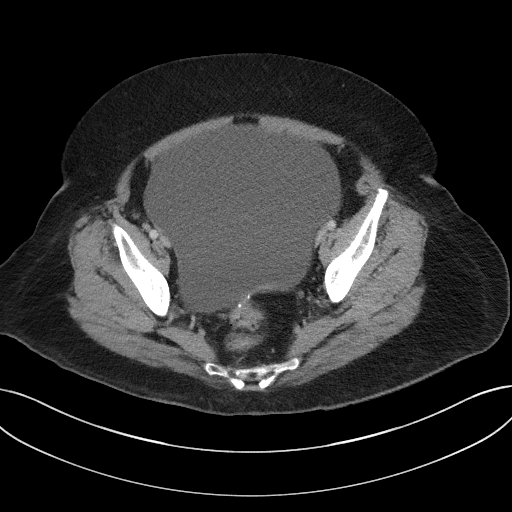
[im 37/100  soft-tissue]
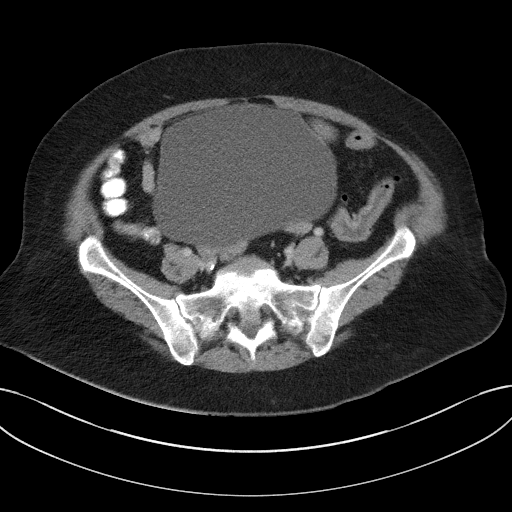
[im 42/100  soft-tissue]
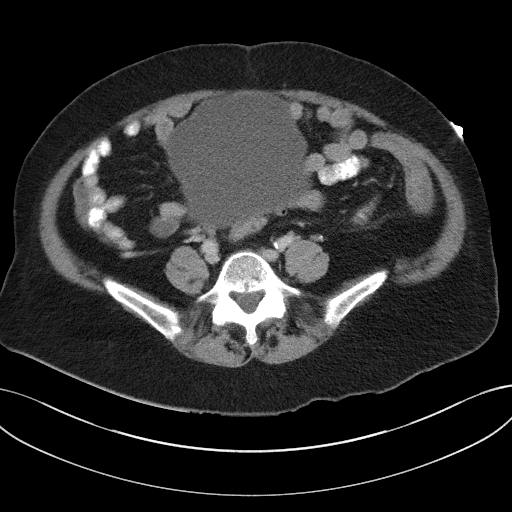
[im 53/100  soft-tissue]
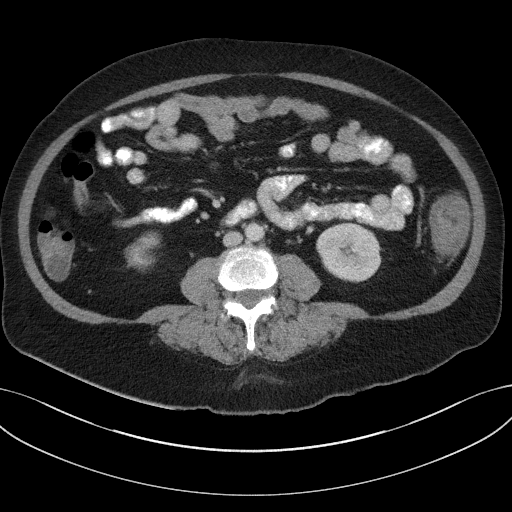
[im 58/100  soft-tissue]
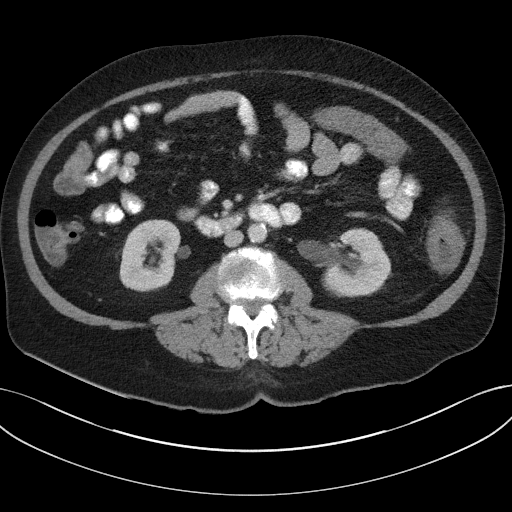
[im 63/100  soft-tissue]
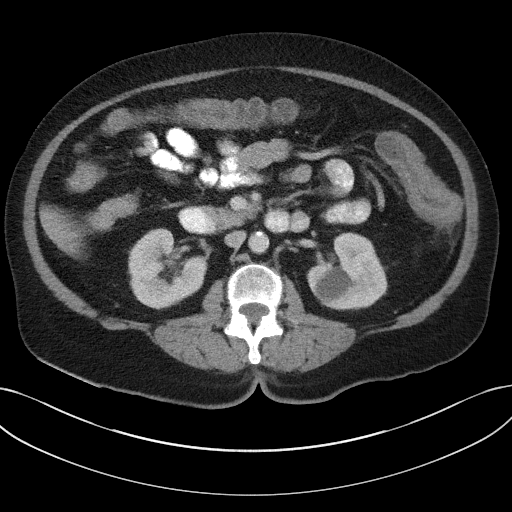
[im 63/100  bone]
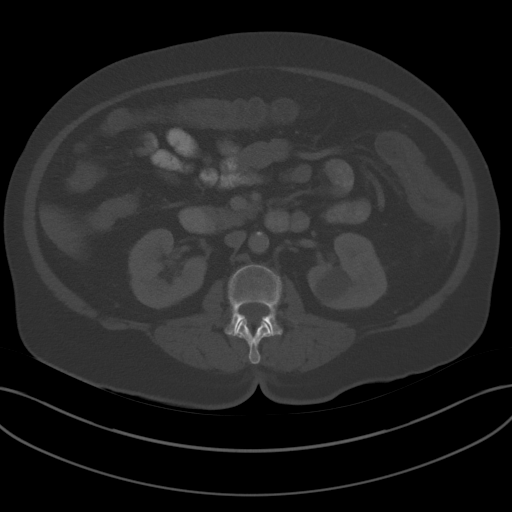
[im 73/100  soft-tissue]
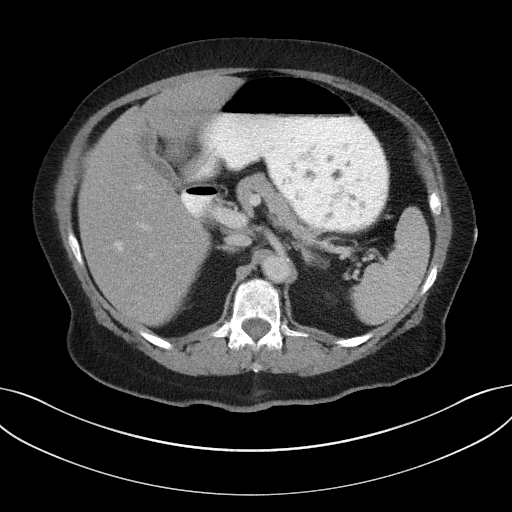
[im 79/100  soft-tissue]
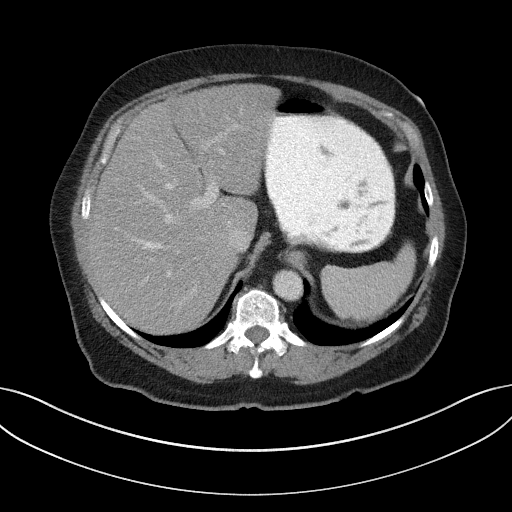
[im 84/100  soft-tissue]
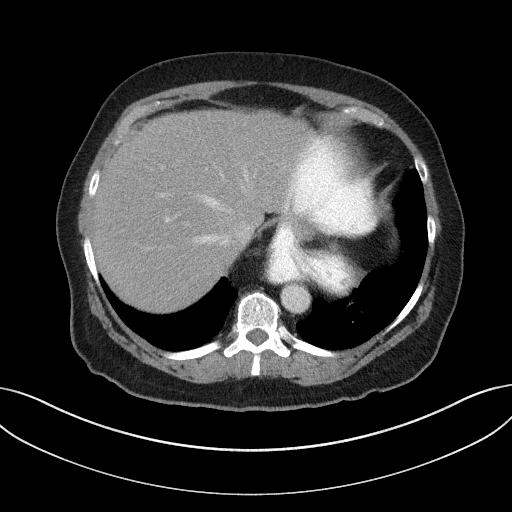
[im 94/100  soft-tissue]
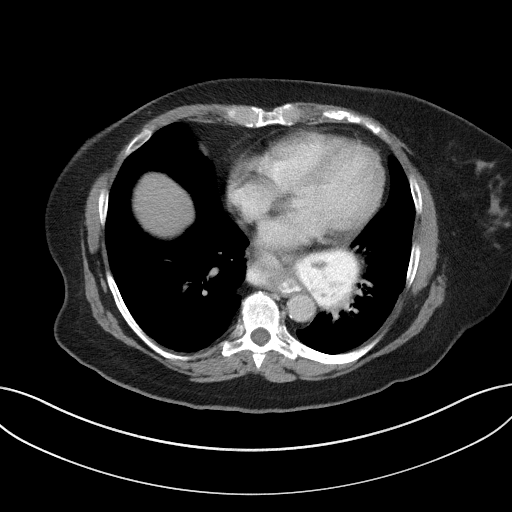

[Series 5: coronal st · coronal · 0.85mm/px · 3 of 91 slices shown]
[im 31/91  soft-tissue]
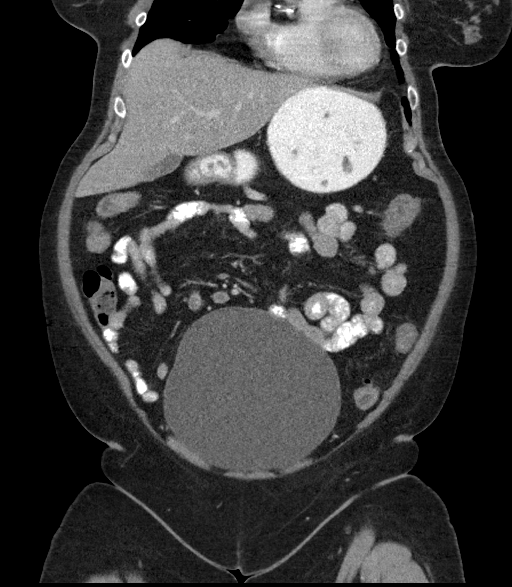
[im 41/91  soft-tissue]
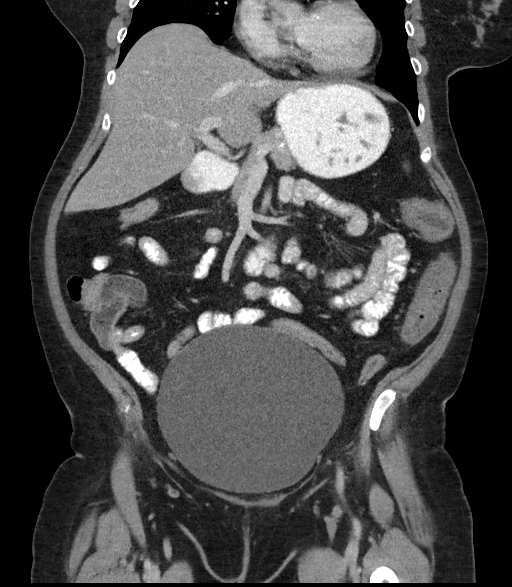
[im 51/91  soft-tissue]
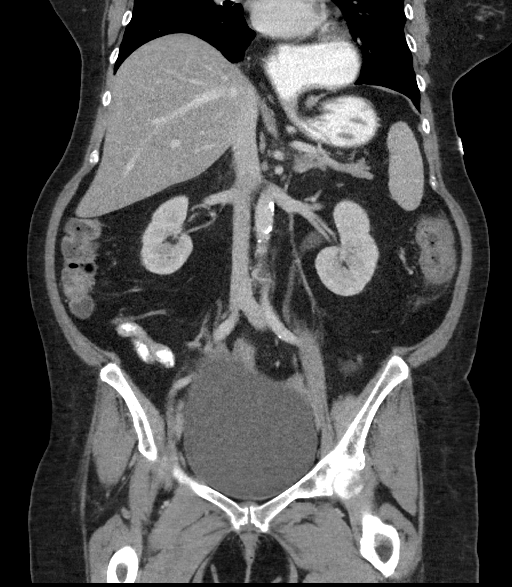

[16 of 46 positions shown; findings below may reference images not displayed]

FINDINGS: Lower chest: Large hiatal hernia is noted. Visualized lung bases are
unremarkable.

Hepatobiliary: No focal liver abnormality is seen. No gallstones,
gallbladder wall thickening, or biliary dilatation.

Pancreas: Unremarkable. No pancreatic ductal dilatation or
surrounding inflammatory changes.

Spleen: Normal in size without focal abnormality.

Adrenals/Urinary Tract: Adrenal glands are unremarkable. Stable left
renal cyst is noted. No hydronephrosis or renal obstruction is
noted. No renal or ureteral calculi are noted. Severe urinary
bladder distention is noted.

Stomach/Bowel: The stomach and appendix appear normal. Wall
thickening of the splenic flexure and descending colon is noted with
inflammation consistent with infectious or inflammatory colitis.
Sigmoid diverticulosis is noted without inflammation.

Vascular/Lymphatic: Aortic atherosclerosis. No enlarged abdominal or
pelvic lymph nodes.

Reproductive: Status post hysterectomy. No adnexal masses.

Other: No abdominal wall hernia or abnormality. No abdominopelvic
ascites.

Musculoskeletal: No acute or significant osseous findings.
IMPRESSION: Large sliding-type hiatal hernia.

Severe urinary bladder distention.

Findings consistent with infectious or inflammatory colitis
involving the splenic flexure and descending colon.

Aortic atherosclerosis.

## 2019-01-30 NOTE — Progress Notes (Signed)
Awv was done by [facility]

## 2019-02-05 ENCOUNTER — Telehealth: Payer: Self-pay

## 2019-02-05 ENCOUNTER — Other Ambulatory Visit: Payer: Self-pay | Admitting: Nurse Practitioner

## 2019-02-05 DIAGNOSIS — N390 Urinary tract infection, site not specified: Secondary | ICD-10-CM

## 2019-02-05 MED ORDER — SULFAMETHOXAZOLE-TRIMETHOPRIM 800-160 MG PO TABS: 1.0000 | ORAL_TABLET | Freq: Two times a day (BID) | ORAL | 0 refills | Status: DC

## 2019-02-05 NOTE — Telephone Encounter (Signed)
Will repeat bactrim Ds bid for 7 days. Sent new prescription to [facility] [address].

## 2019-02-05 NOTE — Telephone Encounter (Signed)
Pt advised we send bactrim to [facility]

## 2019-02-05 NOTE — Progress Notes (Signed)
Will repeat bactrim Ds bid for 7 days. Sent new prescription to [facility] [address].

## 2019-02-11 ENCOUNTER — Other Ambulatory Visit: Payer: Self-pay

## 2019-02-11 DIAGNOSIS — G2581 Restless legs syndrome: Secondary | ICD-10-CM

## 2019-02-11 MED ORDER — ROPINIROLE HCL 0.25 MG PO TABS: 0.2500 mg | ORAL_TABLET | Freq: Two times a day (BID) | ORAL | 1 refills | Status: DC

## 2019-02-11 MED ORDER — CITALOPRAM HYDROBROMIDE 40 MG PO TABS: 40.0000 mg | ORAL_TABLET | Freq: Every day | ORAL | 1 refills | Status: DC

## 2019-02-18 ENCOUNTER — Ambulatory Visit
Admission: RE | Admit: 2019-02-18 | Discharge: 2019-02-18 | Disposition: A | Discharge status: Home / Self Care | Payer: Medicare Other | Source: Ambulatory Visit | Attending: Nurse Practitioner | Admitting: Nurse Practitioner

## 2019-02-18 DIAGNOSIS — Z1231 Encounter for screening mammogram for malignant neoplasm of breast: Secondary | ICD-10-CM | POA: Insufficient documentation

## 2019-02-18 NOTE — Progress Notes (Signed)
Negative mammogram

## 2019-02-27 DIAGNOSIS — R002 Palpitations: Secondary | ICD-10-CM | POA: Insufficient documentation

## 2019-02-27 DIAGNOSIS — Z9079 Acquired absence of other genital organ(s): Secondary | ICD-10-CM | POA: Insufficient documentation

## 2019-02-27 DIAGNOSIS — F419 Anxiety disorder, unspecified: Secondary | ICD-10-CM | POA: Insufficient documentation

## 2019-02-27 DIAGNOSIS — J309 Allergic rhinitis, unspecified: Secondary | ICD-10-CM | POA: Insufficient documentation

## 2019-02-27 DIAGNOSIS — E739 Lactose intolerance, unspecified: Secondary | ICD-10-CM | POA: Insufficient documentation

## 2019-03-01 ENCOUNTER — Other Ambulatory Visit: Payer: Self-pay

## 2019-03-01 DIAGNOSIS — F322 Major depressive disorder, single episode, severe without psychotic features: Secondary | ICD-10-CM

## 2019-03-01 DIAGNOSIS — M810 Age-related osteoporosis without current pathological fracture: Secondary | ICD-10-CM

## 2019-03-01 MED ORDER — QUETIAPINE FUMARATE 50 MG PO TABS: 50.0000 mg | ORAL_TABLET | Freq: Two times a day (BID) | ORAL | 1 refills | Status: DC

## 2019-03-01 MED ORDER — ALENDRONATE SODIUM 35 MG PO TABS: 35.0000 mg | ORAL_TABLET | ORAL | 5 refills | Status: DC

## 2019-03-07 ENCOUNTER — Other Ambulatory Visit: Payer: Self-pay

## 2019-03-07 MED ORDER — METFORMIN HCL 500 MG PO TABS: 500.0000 mg | ORAL_TABLET | Freq: Two times a day (BID) | ORAL | 3 refills | Status: DC

## 2019-03-11 ENCOUNTER — Other Ambulatory Visit: Payer: Self-pay

## 2019-03-11 ENCOUNTER — Telehealth: Payer: Self-pay

## 2019-03-11 DIAGNOSIS — Z1211 Encounter for screening for malignant neoplasm of colon: Secondary | ICD-10-CM

## 2019-03-11 DIAGNOSIS — Z800 Family history of malignant neoplasm of digestive organs: Secondary | ICD-10-CM

## 2019-03-11 NOTE — Telephone Encounter (Signed)
Gastroenterology Pre-Procedure Review  Request Date: Friday 04/12/19 Requesting Physician: Dr. Vicente Males  PATIENT REVIEW QUESTIONS: The patients daughter Arrie Aran responded to the following health history questions as indicated:    1. Are you having any GI issues? no 2. Do you have a personal history of Polyps? no 3. Do you have a family history of Colon Cancer or Polyps? yes (Mother colon cancer) 4. Diabetes Mellitus? yes (takes oral meds) 5. Joint replacements in the past 12 months?no 6. Major health problems in the past 3 months?no 7. Any artificial heart valves, MVP, or defibrillator?no    MEDICATIONS & ALLERGIES:    Patient reports the following regarding taking any anticoagulation/antiplatelet therapy:   Plavix, Coumadin, Eliquis, Xarelto, Lovenox, Pradaxa, Brilinta, or Effient? no Aspirin? no  Patient confirms/reports the following medications:  Current Outpatient Medications  Medication Sig Dispense Refill  . Accu-Chek FastClix Lancets MISC 1 each by Does not apply route 2 (two) times daily. Use as directed twice daily diag 111.65 100 each 3  . alendronate (FOSAMAX) 35 MG tablet Take 1 tablet (35 mg total) by mouth every 7 (seven) days. Take with a full glass of water on an empty stomach. 4 tablet 5  . aspirin 81 MG tablet Take 1 tablet by mouth daily.    Marland Kitchen atorvastatin (LIPITOR) 20 MG tablet Take 1 tablet (20 mg total) by mouth daily. 90 tablet 3  . busPIRone (BUSPAR) 10 MG tablet Take 1/2 to 1 tablet by mouth twice daily. 180 tablet 1  . citalopram (CELEXA) 40 MG tablet Take 1 tablet (40 mg total) by mouth daily. 90 tablet 1  . Crisaborole (EUCRISA) 2 % OINT Apply 1 application topically 2 (two) times daily as needed. 60 g 2  . diltiazem (CARDIZEM CD) 240 MG 24 hr capsule TAKE 1 CAPSULE(240 MG) BY MOUTH DAILY 90 capsule 3  . FLUAD 0.5 ML SUSY ADM 0.5ML IM UTD  0  . Fluocinolone Acetonide 0.01 % OIL Place 5 drops in ear(s) 2 (two) times a day. 20 mL 0  . glucose blood (ACCU-CHEK  GUIDE) test strip Use as directed twice a day diag e11.65 100 each 3  . levothyroxine (SYNTHROID) 50 MCG tablet Take 1 tablet (50 mcg total) by mouth every morning. 90 tablet 1  . lisinopril (ZESTRIL) 20 MG tablet TAKE 1 TABLET(20 MG) BY MOUTH DAILY 90 tablet 3  . metFORMIN (GLUCOPHAGE) 500 MG tablet Take 1 tablet (500 mg total) by mouth 2 (two) times daily with a meal. 180 tablet 3  . metoprolol tartrate (LOPRESSOR) 50 MG tablet Take 1 tablet (50 mg total) by mouth 2 (two) times daily. 60 tablet 0  . pantoprazole (PROTONIX) 40 MG tablet Take 1 tablet (40 mg total) by mouth 2 (two) times daily. 180 tablet 1  . promethazine (PHENERGAN) 25 MG tablet Take 1 tablet (25 mg total) by mouth 2 (two) times daily as needed for nausea or vomiting. 30 tablet 3  . QUEtiapine (SEROQUEL) 50 MG tablet Take 1 tablet (50 mg total) by mouth 2 (two) times daily. 180 tablet 1  . rOPINIRole (REQUIP) 0.25 MG tablet Take 1 tablet (0.25 mg total) by mouth 2 times daily at 12 noon and 4 pm. 180 tablet 1  . sucralfate (CARAFATE) 1 g tablet TAKE 1 TABLET BY MOUTH FOUR TIMES A DAY IF NEEDED 360 tablet 0  . sulfamethoxazole-trimethoprim (BACTRIM DS) 800-160 MG tablet Take 1 tablet by mouth 2 (two) times daily. 14 tablet 0  . tiZANidine (ZANAFLEX) 2 MG tablet  Take 1 tablet (2 mg total) by mouth 2 (two) times daily as needed for muscle spasms. 45 tablet 1  . zolpidem (AMBIEN) 10 MG tablet Take 1 tablet (10 mg total) by mouth at bedtime as needed for sleep. 30 tablet 2   No current facility-administered medications for this visit.    Patient confirms/reports the following allergies:  No Known Allergies  No orders of the defined types were placed in this encounter.   AUTHORIZATION INFORMATION Primary Insurance: 1D#: Group #:  Secondary Insurance: 1D#: Group #:  SCHEDULE INFORMATION: Date: Friday 04/12/19 Time: Location:[facility]

## 2019-03-22 ENCOUNTER — Telehealth: Payer: Self-pay

## 2019-03-22 NOTE — Telephone Encounter (Signed)
Confirmed appointment on 03/25/2019 and screened for covid. kl[facility] 

## 2019-03-25 ENCOUNTER — Encounter: Payer: Self-pay | Admitting: Nurse Practitioner

## 2019-03-25 ENCOUNTER — Ambulatory Visit (INDEPENDENT_AMBULATORY_CARE_PROVIDER_SITE_OTHER): Payer: Medicare Other | Admitting: Nurse Practitioner

## 2019-03-25 ENCOUNTER — Other Ambulatory Visit: Payer: Self-pay

## 2019-03-25 VITALS — BP 133/77 | HR 68 | Temp 97.2°F | Resp 16 | Ht 60.0 in | Wt 177.6 lb

## 2019-03-25 DIAGNOSIS — I341 Nonrheumatic mitral (valve) prolapse: Secondary | ICD-10-CM

## 2019-03-25 DIAGNOSIS — E1165 Type 2 diabetes mellitus with hyperglycemia: Secondary | ICD-10-CM

## 2019-03-25 DIAGNOSIS — K219 Gastro-esophageal reflux disease without esophagitis: Secondary | ICD-10-CM | POA: Insufficient documentation

## 2019-03-25 DIAGNOSIS — I10 Essential (primary) hypertension: Secondary | ICD-10-CM | POA: Diagnosis not present

## 2019-03-25 LAB — POCT GLYCOSYLATED HEMOGLOBIN (HGB A1C): Hemoglobin A1C: 7.2 % — AB (ref 4.0–5.6)

## 2019-03-25 MED ORDER — METOPROLOL TARTRATE 25 MG PO TABS: 25.0000 mg | ORAL_TABLET | Freq: Two times a day (BID) | ORAL | 3 refills | Status: DC

## 2019-03-25 MED ORDER — METOPROLOL TARTRATE 25 MG PO TABS: 50.0000 mg | ORAL_TABLET | Freq: Two times a day (BID) | ORAL | 3 refills | Status: DC

## 2019-03-25 MED ORDER — SUCRALFATE 1 G PO TABS: ORAL_TABLET | ORAL | 1 refills | Status: DC

## 2019-03-25 NOTE — Progress Notes (Signed)
[facility] [place], [place] [address]  Internal MEDICINE  Office Visit Note  Patient Name: [name]  [id number]  [id number]  Date of Service: 03/25/2019  Chief Complaint  Patient presents with  . Diabetes  . Hypertension  . Hyperlipidemia  . Irregular Heart Beat    mitral valve regurgitation, seen in [facility]   . Follow-up    labs and mammogram   . Medication Refill    sucralfate,  was put back on metoprolol 25 mg 2 times daily, doctor stated ptmight have to have this increased (90 day supply)    The patient is here for follow up visit. Blood sugars are well controlled. HgbA1c is 7.2 today. This is down from 7.3 at her last check. She has had to go to urgent care a few weeks ago. Felt a "fluttering" in her chest. They did ECG which was negative for ischemia. She was restarted on metoprolol 25mg  twice daily. She is tolerating this well. Has had very few feelings of fluttering in her heart since then. She does have history of mitral valve prolapse. This has not been evaluated for some time.       Current Medication: Outpatient Encounter Medications as of 03/25/2019  Medication Sig  . Accu-Chek FastClix Lancets MISC 1 each by Does not apply route 2 (two) times daily. Use as directed twice daily diag 111.65  . alendronate (FOSAMAX) 35 MG tablet Take 1 tablet (35 mg total) by mouth every 7 (seven) days. Take with a full glass of water on an empty stomach.  Marland Kitchen aspirin 81 MG tablet Take 1 tablet by mouth daily.  Marland Kitchen atorvastatin (LIPITOR) 20 MG tablet Take 1 tablet (20 mg total) by mouth daily.  . busPIRone (BUSPAR) 10 MG tablet Take 1/2 to 1 tablet by mouth twice daily.  . citalopram (CELEXA) 40 MG tablet Take 1 tablet (40 mg total) by mouth daily.  Stasia Cavalier (EUCRISA) 2 % OINT Apply 1 application topically 2 (two) times daily as needed.  . diltiazem (CARDIZEM CD) 240 MG 24 hr capsule TAKE 1 CAPSULE(240 MG) BY MOUTH DAILY  . FLUAD 0.5 ML  SUSY ADM 0.5ML IM UTD  . Fluocinolone Acetonide 0.01 % OIL Place 5 drops in ear(s) 2 (two) times a day.  Marland Kitchen glucose blood (ACCU-CHEK GUIDE) test strip Use as directed twice a day diag e11.65  . levothyroxine (SYNTHROID) 50 MCG tablet Take 1 tablet (50 mcg total) by mouth every morning.  Marland Kitchen lisinopril (ZESTRIL) 20 MG tablet TAKE 1 TABLET(20 MG) BY MOUTH DAILY  . metFORMIN (GLUCOPHAGE) 500 MG tablet Take 1 tablet (500 mg total) by mouth 2 (two) times daily with a meal.  . metoprolol tartrate (LOPRESSOR) 25 MG tablet Take 2 tablets (50 mg total) by mouth 2 (two) times daily.  . pantoprazole (PROTONIX) 40 MG tablet Take 1 tablet (40 mg total) by mouth 2 (two) times daily.  . promethazine (PHENERGAN) 25 MG tablet Take 1 tablet (25 mg total) by mouth 2 (two) times daily as needed for nausea or vomiting.  Marland Kitchen QUEtiapine (SEROQUEL) 50 MG tablet Take 1 tablet (50 mg total) by mouth 2 (two) times daily.  Marland Kitchen rOPINIRole (REQUIP) 0.25 MG tablet Take 1 tablet (0.25 mg total) by mouth 2 times daily at 12 noon and 4 pm.  . sucralfate (CARAFATE) 1 g tablet TAKE 1 TABLET BY MOUTH FOUR TIMES A DAY IF NEEDED  . sulfamethoxazole-trimethoprim (BACTRIM DS) 800-160 MG tablet Take 1 tablet  by mouth 2 (two) times daily.  Marland Kitchen tiZANidine (ZANAFLEX) 2 MG tablet Take 1 tablet (2 mg total) by mouth 2 (two) times daily as needed for muscle spasms.  Marland Kitchen zolpidem (AMBIEN) 10 MG tablet Take 1 tablet (10 mg total) by mouth at bedtime as needed for sleep.  . [DISCONTINUED] metoprolol tartrate (LOPRESSOR) 50 MG tablet Take 1 tablet (50 mg total) by mouth 2 (two) times daily.  . [DISCONTINUED] sucralfate (CARAFATE) 1 g tablet TAKE 1 TABLET BY MOUTH FOUR TIMES A DAY IF NEEDED   No facility-administered encounter medications on file as of 03/25/2019.    Surgical History: Past Surgical History:  Procedure Laterality Date  . ABDOMINAL HYSTERECTOMY    . BREAST EXCISIONAL BIOPSY Bilateral 80s   benign    Medical History: Past Medical  History:  Diagnosis Date  . Anxiety   . Depression   . Diabetes mellitus, type II ([place])   . GERD (gastroesophageal reflux disease)   . Hypertension   . Thyroid disease     Family History: Family History  Problem Relation Age of Onset  . Anxiety disorder Mother   . Colon cancer Mother   . Heart disease Father   . Anxiety disorder Father   . Obesity Sister   . Anxiety disorder Sister   . Depression Sister   . Osteoporosis Sister   . Breast cancer Paternal Aunt     Social History   Socioeconomic History  . Marital status: Widowed    Spouse name: Not on file  . Number of children: Not on file  . Years of education: Not on file  . Highest education level: Not on file  Occupational History  . Not on file  Tobacco Use  . Smoking status: Never Smoker  . Smokeless tobacco: Never Used  Substance and Sexual Activity  . Alcohol use: No    Alcohol/week: 0.0 standard drinks  . Drug use: No  . Sexual activity: Never  Other Topics Concern  . Not on file  Social History Narrative  . Not on file   Social Determinants of Health   Financial Resource Strain:   . Difficulty of Paying Living Expenses: Not on file  Food Insecurity:   . Worried About Charity fundraiser in the Last Year: Not on file  . Ran Out of Food in the Last Year: Not on file  Transportation Needs:   . Lack of Transportation (Medical): Not on file  . Lack of Transportation (Non-Medical): Not on file  Physical Activity:   . Days of Exercise per Week: Not on file  . Minutes of Exercise per Session: Not on file  Stress:   . Feeling of Stress : Not on file  Social Connections:   . Frequency of Communication with Friends and Family: Not on file  . Frequency of Social Gatherings with Friends and Family: Not on file  . Attends Religious Services: Not on file  . Active Member of Clubs or Organizations: Not on file  . Attends Archivist Meetings: Not on file  . Marital Status: Not on file  Intimate  Partner Violence:   . Fear of Current or Ex-Partner: Not on file  . Emotionally Abused: Not on file  . Physically Abused: Not on file  . Sexually Abused: Not on file      Review of Systems  Constitutional: Positive for fatigue. Negative for activity change, chills and unexpected weight change.  HENT: Negative for congestion, postnasal drip, rhinorrhea, sneezing and sore  throat.   Respiratory: Negative for cough, chest tightness, shortness of breath and wheezing.   Cardiovascular: Negative for chest pain and palpitations.  Gastrointestinal: Negative for abdominal pain, constipation, diarrhea, nausea and vomiting.  Endocrine: Negative for cold intolerance, heat intolerance, polydipsia and polyuria.       Blood sugars doing well   Musculoskeletal: Negative for arthralgias, back pain, joint swelling and neck pain.  Skin: Negative for rash.  Allergic/Immunologic: Negative for environmental allergies.  Neurological: Negative for dizziness, tremors, numbness and headaches.  Hematological: Negative for adenopathy. Does not bruise/bleed easily.  Psychiatric/Behavioral: Positive for dysphoric mood. Negative for behavioral problems (Depression), sleep disturbance and suicidal ideas. The patient is nervous/anxious.     Today's Vitals   03/25/19 1431  BP: 133/77  Pulse: 68  Resp: 16  Temp: (!) 97.2 F (36.2 C)  SpO2: 97%  Weight: 177 lb 9.6 oz (80.6 kg)   Body mass index is 34.69 kg/m.  Physical Exam Vitals and nursing note reviewed.  Constitutional:      General: She is not in acute distress.    Appearance: Normal appearance. She is well-developed. She is not diaphoretic.  HENT:     Head: Normocephalic and atraumatic.     Mouth/Throat:     Pharynx: No oropharyngeal exudate.  Eyes:     Conjunctiva/sclera: Conjunctivae normal.     Pupils: Pupils are equal, round, and reactive to light.  Neck:     Thyroid: No thyromegaly.     Vascular: No carotid bruit or JVD.     Trachea: No  tracheal deviation.  Cardiovascular:     Rate and Rhythm: Normal rate and regular rhythm.     Heart sounds: Normal heart sounds. No murmur. No friction rub. No gallop.   Pulmonary:     Effort: Pulmonary effort is normal. No respiratory distress.     Breath sounds: Normal breath sounds. No wheezing or rales.  Chest:     Chest wall: No tenderness.  Abdominal:     Palpations: Abdomen is soft.  Musculoskeletal:        General: Normal range of motion.     Cervical back: Normal range of motion and neck supple.  Lymphadenopathy:     Cervical: No cervical adenopathy.  Skin:    General: Skin is warm and dry.  Neurological:     Mental Status: She is alert and oriented to person, place, and time. Mental status is at baseline.     Cranial Nerves: No cranial nerve deficit.  Psychiatric:        Mood and Affect: Mood normal.        Behavior: Behavior normal.        Thought Content: Thought content normal.        Judgment: Judgment normal.    Assessment/Plan: 1. Type 2 diabetes mellitus with hyperglycemia, without long-term current use of insulin (HCC) - POCT HgB A1C 7.2 today.  Continue metformin as prescribed   2. Essential hypertension Metoprolol 25mg  bid restarted per urgent care. Will continue 25mg  twice daily. Continue other bp medications prescribed  - metoprolol tartrate (LOPRESSOR) 25 MG tablet; Take 2 tablets (50 mg total) by mouth 2 (two) times daily.  Dispense: 60 tablet; Refill: 3  3. Mitral valve prolapse Echocardiogram ordered for further evaluation.  - ECHOCARDIOGRAM COMPLETE; Future  4. Gastroesophageal reflux disease without esophagitis Continue sucralfate up to four times daily as needed.  - sucralfate (CARAFATE) 1 g tablet; TAKE 1 TABLET BY MOUTH FOUR TIMES A DAY IF  NEEDED  Dispense: 360 tablet; Refill: 1  General Counseling: Tava verbalizes understanding of the findings of todays visit and agrees with plan of treatment. I have discussed any further diagnostic  evaluation that may be needed or ordered today. We also reviewed her medications today. she has been encouraged to call the office with any questions or concerns that should arise related to todays visit.  Diabetes Counseling:  1. Addition of ACE inh/ ARB'S for nephroprotection. Microalbumin is updated  2. Diabetic foot care, prevention of complications. Podiatry consult 3. Exercise and lose weight.  4. Diabetic eye examination, Diabetic eye exam is updated  5. Monitor blood sugar closlely. nutrition counseling.  6. Sign and symptoms of hypoglycemia including shaking sweating,confusion and headaches.  This patient was seen by Leretha Pol FNP Collaboration with Dr Lavera Guise as a part of collaborative care agreement  Orders Placed This Encounter  Procedures  . POCT HgB A1C  . ECHOCARDIOGRAM COMPLETE    Meds ordered this encounter  Medications  . sucralfate (CARAFATE) 1 g tablet    Sig: TAKE 1 TABLET BY MOUTH FOUR TIMES A DAY IF NEEDED    Dispense:  360 tablet    Refill:  1    Order Specific Question:   Supervising Provider    Answer:   Lavera Guise [id number]  . metoprolol tartrate (LOPRESSOR) 25 MG tablet    Sig: Take 2 tablets (50 mg total) by mouth 2 (two) times daily.    Dispense:  60 tablet    Refill:  3    Order Specific Question:   Supervising Provider    Answer:   Lavera Guise [id number]    Total time spent: 30 Minutes   Time spent includes review of chart, medications, test results, and follow up plan with the patient.      Dr Lavera Guise Internal medicine

## 2019-03-28 ENCOUNTER — Ambulatory Visit: Payer: Medicare Other | Admitting: Nurse Practitioner

## 2019-04-08 ENCOUNTER — Other Ambulatory Visit: Payer: Self-pay

## 2019-04-08 MED ORDER — BUSPIRONE HCL 10 MG PO TABS: ORAL_TABLET | ORAL | 1 refills | Status: DC

## 2019-04-10 ENCOUNTER — Other Ambulatory Visit: Payer: Self-pay

## 2019-04-10 ENCOUNTER — Other Ambulatory Visit
Admission: RE | Admit: 2019-04-10 | Discharge: 2019-04-10 | Disposition: A | Discharge status: Home / Self Care | Payer: Medicare Other | Source: Ambulatory Visit | Attending: Gastroenterology | Admitting: Gastroenterology

## 2019-04-10 DIAGNOSIS — Z01812 Encounter for preprocedural laboratory examination: Secondary | ICD-10-CM | POA: Insufficient documentation

## 2019-04-10 DIAGNOSIS — Z20822 Contact with and (suspected) exposure to covid-19: Secondary | ICD-10-CM | POA: Insufficient documentation

## 2019-04-10 LAB — SARS CORONAVIRUS 2 (TAT 6-24 HRS): SARS Coronavirus 2: NEGATIVE

## 2019-04-11 ENCOUNTER — Encounter: Payer: Self-pay | Admitting: Gastroenterology

## 2019-04-12 ENCOUNTER — Ambulatory Visit: Payer: Medicare Other | Admitting: Anesthesiology

## 2019-04-12 ENCOUNTER — Encounter: Payer: Self-pay | Admitting: Gastroenterology

## 2019-04-12 ENCOUNTER — Other Ambulatory Visit: Payer: Self-pay

## 2019-04-12 ENCOUNTER — Encounter
Admission: RE | Disposition: A | Discharge status: Home / Self Care | Payer: Self-pay | Source: Home / Self Care | Attending: Gastroenterology

## 2019-04-12 ENCOUNTER — Ambulatory Visit
Admission: RE | Admit: 2019-04-12 | Discharge: 2019-04-12 | Disposition: A | Discharge status: Home / Self Care | Payer: Medicare Other | Attending: Gastroenterology | Admitting: Gastroenterology

## 2019-04-12 DIAGNOSIS — F419 Anxiety disorder, unspecified: Secondary | ICD-10-CM | POA: Insufficient documentation

## 2019-04-12 DIAGNOSIS — I10 Essential (primary) hypertension: Secondary | ICD-10-CM | POA: Diagnosis not present

## 2019-04-12 DIAGNOSIS — K5730 Diverticulosis of large intestine without perforation or abscess without bleeding: Secondary | ICD-10-CM | POA: Diagnosis not present

## 2019-04-12 DIAGNOSIS — Z86010 Personal history of colonic polyps: Secondary | ICD-10-CM | POA: Insufficient documentation

## 2019-04-12 DIAGNOSIS — Z800 Family history of malignant neoplasm of digestive organs: Secondary | ICD-10-CM | POA: Diagnosis not present

## 2019-04-12 DIAGNOSIS — Z7983 Long term (current) use of bisphosphonates: Secondary | ICD-10-CM | POA: Insufficient documentation

## 2019-04-12 DIAGNOSIS — K635 Polyp of colon: Secondary | ICD-10-CM

## 2019-04-12 DIAGNOSIS — Z79890 Hormone replacement therapy (postmenopausal): Secondary | ICD-10-CM | POA: Insufficient documentation

## 2019-04-12 DIAGNOSIS — K219 Gastro-esophageal reflux disease without esophagitis: Secondary | ICD-10-CM | POA: Insufficient documentation

## 2019-04-12 DIAGNOSIS — F329 Major depressive disorder, single episode, unspecified: Secondary | ICD-10-CM | POA: Diagnosis not present

## 2019-04-12 DIAGNOSIS — E079 Disorder of thyroid, unspecified: Secondary | ICD-10-CM | POA: Diagnosis not present

## 2019-04-12 DIAGNOSIS — Z79899 Other long term (current) drug therapy: Secondary | ICD-10-CM | POA: Insufficient documentation

## 2019-04-12 DIAGNOSIS — Z1211 Encounter for screening for malignant neoplasm of colon: Secondary | ICD-10-CM | POA: Insufficient documentation

## 2019-04-12 DIAGNOSIS — K5140 Inflammatory polyps of colon without complications: Secondary | ICD-10-CM | POA: Insufficient documentation

## 2019-04-12 DIAGNOSIS — D122 Benign neoplasm of ascending colon: Secondary | ICD-10-CM | POA: Insufficient documentation

## 2019-04-12 DIAGNOSIS — Z7984 Long term (current) use of oral hypoglycemic drugs: Secondary | ICD-10-CM | POA: Diagnosis not present

## 2019-04-12 DIAGNOSIS — Z7982 Long term (current) use of aspirin: Secondary | ICD-10-CM | POA: Insufficient documentation

## 2019-04-12 DIAGNOSIS — E119 Type 2 diabetes mellitus without complications: Secondary | ICD-10-CM | POA: Diagnosis not present

## 2019-04-12 HISTORY — PX: COLONOSCOPY WITH PROPOFOL: SHX5780

## 2019-04-12 LAB — GLUCOSE, CAPILLARY: Glucose-Capillary: 166 mg/dL — ABNORMAL HIGH (ref 70–99)

## 2019-04-12 SURGERY — COLONOSCOPY WITH PROPOFOL
Anesthesia: General

## 2019-04-12 MED ORDER — PROPOFOL 10 MG/ML IV BOLUS
INTRAVENOUS | Status: DC | PRN
  Administered 2019-04-12: 50 mg via INTRAVENOUS
  Administered 2019-04-12: 30 mg via INTRAVENOUS
  Administered 2019-04-12: 10 mg via INTRAVENOUS

## 2019-04-12 MED ORDER — SODIUM CHLORIDE 0.9 % IV SOLN
INTRAVENOUS | Status: DC
  Administered 2019-04-12: 1000 mL via INTRAVENOUS

## 2019-04-12 MED ORDER — PROPOFOL 500 MG/50ML IV EMUL
INTRAVENOUS | Status: DC | PRN
  Administered 2019-04-12: 150 ug/kg/min via INTRAVENOUS

## 2019-04-12 MED ORDER — LIDOCAINE HCL (CARDIAC) PF 100 MG/5ML IV SOSY
PREFILLED_SYRINGE | INTRAVENOUS | Status: DC | PRN
  Administered 2019-04-12: 50 mg via INTRAVENOUS

## 2019-04-12 MED ORDER — PROPOFOL 500 MG/50ML IV EMUL
INTRAVENOUS | Status: AC
  Filled 2019-04-12: qty 50

## 2019-04-12 NOTE — Anesthesia Postprocedure Evaluation (Signed)
Anesthesia Post Note  Patient: [name]  Procedure(s) Performed: COLONOSCOPY WITH PROPOFOL (N/A )  Patient location during evaluation: Endoscopy Anesthesia Type: General Level of consciousness: awake and alert Pain management: pain level controlled Vital Signs Assessment: post-procedure vital signs reviewed and stable Respiratory status: spontaneous breathing, nonlabored ventilation, respiratory function stable and patient connected to nasal cannula oxygen Cardiovascular status: blood pressure returned to baseline and stable Postop Assessment: no apparent nausea or vomiting Anesthetic complications: no     Last Vitals:  Vitals:   04/12/19 1006 04/12/19 1016  BP: 116/65 (!) 108/92  Pulse: 70 70  Resp: 19 20  Temp:    SpO2: 94% 95%    Last Pain:  Vitals:   04/12/19 1016  TempSrc:   PainSc: 0-No pain                 Arita Miss

## 2019-04-12 NOTE — Transfer of Care (Signed)
Immediate Anesthesia Transfer of Care Note  Patient: [name]  Procedure(s) Performed: COLONOSCOPY WITH PROPOFOL (N/A )  Patient Location: PACU  Anesthesia Type:General  Level of Consciousness: sedated  Airway & Oxygen Therapy: Patient Spontanous Breathing and Patient connected to nasal cannula oxygen  Post-op Assessment: Report given to RN and Post -op Vital signs reviewed and stable  Post vital signs: Reviewed and stable  Last Vitals:  Vitals Value Taken Time  BP 94/50 04/12/19 0947  Temp 36.1 C 04/12/19 0946  Pulse 66 04/12/19 0948  Resp 16 04/12/19 0948  SpO2 98 % 04/12/19 0948  Vitals shown include unvalidated device data.  Last Pain:  Vitals:   04/12/19 0946  TempSrc: Temporal  PainSc: Asleep      Patients Stated Pain Goal: 0 ([id number])  Complications: No apparent anesthesia complications

## 2019-04-12 NOTE — Anesthesia Preprocedure Evaluation (Signed)
Anesthesia Evaluation  Patient identified by MRN, date of birth, ID band Patient awake    Reviewed: Allergy & Precautions, NPO status , Patient's Chart, lab work & pertinent test results  History of Anesthesia Complications Negative for: history of anesthetic complications  Airway Mallampati: II  TM Distance: >3 FB Neck ROM: Full    Dental no notable dental hx. (+) Teeth Intact   Pulmonary neg pulmonary ROS, neg sleep apnea, neg COPD, Patient abstained from smoking.Not current smoker,    Pulmonary exam normal breath sounds clear to auscultation       Cardiovascular Exercise Tolerance: Good METShypertension, Pt. on medications (-) CAD and (-) Past MI negative cardio ROS  (-) dysrhythmias  Rhythm:Regular Rate:Normal - Systolic murmurs    Neuro/Psych PSYCHIATRIC DISORDERS Anxiety Depression negative neurological ROS  negative psych ROS   GI/Hepatic GERD  Controlled,(+)     (-) substance abuse  ,   Endo/Other  diabetes, Type 2, Oral Hypoglycemic AgentsHypothyroidism   Renal/GU negative Renal ROS     Musculoskeletal   Abdominal   Peds  Hematology   Anesthesia Other Findings Past Medical History: No date: Anxiety No date: Depression No date: Diabetes mellitus, type II (HCC) No date: GERD (gastroesophageal reflux disease) No date: Hypertension No date: Thyroid disease  Reproductive/Obstetrics                             Anesthesia Physical Anesthesia Plan  ASA: II  Anesthesia Plan: General   Post-op Pain Management:    Induction: Intravenous  PONV Risk Score and Plan: 3 and Ondansetron, Propofol infusion and TIVA  Airway Management Planned: Nasal Cannula  Additional Equipment: None  Intra-op Plan:   Post-operative Plan:   Informed Consent: I have reviewed the patients History and Physical, chart, labs and discussed the procedure including the risks, benefits and  alternatives for the proposed anesthesia with the patient or authorized representative who has indicated his/her understanding and acceptance.     Dental advisory given  Plan Discussed with: CRNA and Surgeon  Anesthesia Plan Comments: (Discussed risks of anesthesia with patient, including possibility of difficulty with spontaneous ventilation under anesthesia necessitating airway intervention, PONV, and rare risks such as cardiac or respiratory or neurological events. Patient understands.)        Anesthesia Quick Evaluation

## 2019-04-12 NOTE — Op Note (Signed)
[facility] Gastroenterology Patient Name: [name] Procedure Date: 04/12/2019 9:15 AM MRN: [id number] Account #: [id number] Date of Birth: Mar 12, 1945 Admit Type: Outpatient Age: 74 Room: [facility] ENDO ROOM 3 Gender: Female Note Status: Finalized Procedure:             Colonoscopy Indications:           High risk colon cancer surveillance: Personal history                         of colonic polyps Providers:             Jonathon Bellows MD, MD Referring MD:          No Local Md, MD (Referring MD) Medicines:             Monitored Anesthesia Care Complications:         No immediate complications. Procedure:             Pre-Anesthesia Assessment:                        - Prior to the procedure, a History and Physical was                         performed, and patient medications, allergies and                         sensitivities were reviewed. The patient's tolerance                         of previous anesthesia was reviewed.                        - The risks and benefits of the procedure and the                         sedation options and risks were discussed with the                         patient. All questions were answered and informed                         consent was obtained.                        - ASA Grade Assessment: II - A patient with mild                         systemic disease.                        After obtaining informed consent, the colonoscope was                         passed under direct vision. Throughout the procedure,                         the patient's blood pressure, pulse, and oxygen                         saturations were monitored continuously. The  Colonoscope was introduced through the anus and                         advanced to the the cecum, identified by the                         appendiceal orifice. The colonoscopy was performed                         with ease. The patient tolerated the  procedure well.                         The quality of the bowel preparation was excellent. Findings:      The perianal and digital rectal examinations were normal.      Two sessile polyps were found in the descending colon and ascending       colon. The polyps were 5 to 7 mm in size. These polyps were removed with       a cold snare. Resection and retrieval were complete.      Multiple small-mouthed diverticula were found in the sigmoid colon.      The exam was otherwise without abnormality on direct and retroflexion       views. Impression:            - Two 5 to 7 mm polyps in the descending colon and in                         the ascending colon, removed with a cold snare.                         Resected and retrieved.                        - Diverticulosis in the sigmoid colon.                        - The examination was otherwise normal on direct and                         retroflexion views. Recommendation:        - Discharge patient to home (with escort).                        - Resume previous diet.                        - Continue present medications.                        - Await pathology results. Procedure Code(s):     --- Professional ---                        [phone], Colonoscopy, flexible; with removal of                         tumor(s), polyp(s), or other lesion(s) by snare                         technique Diagnosis Code(s):     ---  Professional ---                        Z86.010, Personal history of colonic polyps                        K63.5, Polyp of colon                        K57.30, Diverticulosis of large intestine without                         perforation or abscess without bleeding CPT copyright 2019 [employer]. All rights reserved. The codes documented in this report are preliminary and upon coder review may  be revised to meet current compliance requirements. Jonathon Bellows, MD Jonathon Bellows MD, MD 04/12/2019 9:44:39 AM This report  has been signed electronically. Number of Addenda: 0 Note Initiated On: 04/12/2019 9:15 AM Scope Withdrawal Time: 0 hours 9 minutes 41 seconds  Total Procedure Duration: 0 hours 16 minutes 25 seconds  Estimated Blood Loss:  Estimated blood loss: none.      [facility]

## 2019-04-12 NOTE — H&P (Signed)
Jonathon Bellows, MD [address] [place], [place], [place], [address] [address], [place], [place], [place], [address] Phone: [phone]  Fax: [phone]  Primary Care Physician:  Ronnell Freshwater, NP   Pre-Procedure History & Physical: HPI:  [name] is a 74 y.o. female is here for an colonoscopy.   Past Medical History:  Diagnosis Date  . Anxiety   . Depression   . Diabetes mellitus, type II ([place])   . GERD (gastroesophageal reflux disease)   . Hypertension   . Thyroid disease     Past Surgical History:  Procedure Laterality Date  . ABDOMINAL HYSTERECTOMY    . BREAST EXCISIONAL BIOPSY Bilateral 80s   benign  . BREAST SURGERY    . JOINT REPLACEMENT Right 2019    Prior to Admission medications   Medication Sig Start Date End Date Taking? Authorizing Provider  Accu-Chek FastClix Lancets MISC 1 each by Does not apply route 2 (two) times daily. Use as directed twice daily diag 111.65 12/26/18  Yes Scarboro, Audie Clear, NP  alendronate (FOSAMAX) 35 MG tablet Take 1 tablet (35 mg total) by mouth every 7 (seven) days. Take with a full glass of water on an empty stomach. 03/01/19  Yes Boscia, Greer Ee, NP  aspirin 81 MG tablet Take 1 tablet by mouth daily.   Yes [provider]  atorvastatin (LIPITOR) 20 MG tablet Take 1 tablet (20 mg total) by mouth daily. 09/17/18 09/17/19 Yes Boscia, Heather E, NP  busPIRone (BUSPAR) 10 MG tablet Take 1/2 to 1 tablet by mouth twice daily. 04/08/19  Yes Boscia, Greer Ee, NP  citalopram (CELEXA) 40 MG tablet Take 1 tablet (40 mg total) by mouth daily. 02/11/19  Yes Boscia, Greer Ee, NP  Crisaborole (EUCRISA) 2 % OINT Apply 1 application topically 2 (two) times daily as needed. 09/22/17  Yes Boscia, Greer Ee, NP  diltiazem (CARDIZEM CD) 240 MG 24 hr capsule TAKE 1 CAPSULE(240 MG) BY MOUTH DAILY 05/04/18  Yes Boscia, Heather E, NP  FLUAD 0.5 ML SUSY ADM 0.5ML IM UTD 10/20/17  Yes [provider]  Fluocinolone Acetonide  0.01 % OIL Place 5 drops in ear(s) 2 (two) times a day. 08/22/18  Yes Ronnell Freshwater, NP  glucose blood (ACCU-CHEK GUIDE) test strip Use as directed twice a day diag e11.65 12/26/18  Yes Scarboro, Audie Clear, NP  levothyroxine (SYNTHROID) 50 MCG tablet Take 1 tablet (50 mcg total) by mouth every morning. 10/09/18  Yes Boscia, Heather E, NP  lisinopril (ZESTRIL) 20 MG tablet TAKE 1 TABLET(20 MG) BY MOUTH DAILY 07/03/18  Yes Boscia, Heather E, NP  metFORMIN (GLUCOPHAGE) 500 MG tablet Take 1 tablet (500 mg total) by mouth 2 (two) times daily with a meal. 03/07/19  Yes Scarboro, Audie Clear, NP  metoprolol tartrate (LOPRESSOR) 25 MG tablet Take 1 tablet (25 mg total) by mouth 2 (two) times daily. 03/25/19  Yes Boscia, Heather E, NP  pantoprazole (PROTONIX) 40 MG tablet Take 1 tablet (40 mg total) by mouth 2 (two) times daily. 11/20/18  Yes Boscia, Greer Ee, NP  QUEtiapine (SEROQUEL) 50 MG tablet Take 1 tablet (50 mg total) by mouth 2 (two) times daily. 03/01/19  Yes Boscia, Greer Ee, NP  rOPINIRole (REQUIP) 0.25 MG tablet Take 1 tablet (0.25 mg total) by mouth 2 times daily at 12 noon and 4 pm. 02/11/19  Yes Boscia, Heather E, NP  sucralfate (CARAFATE) 1 g tablet TAKE 1 TABLET BY MOUTH FOUR TIMES  A DAY IF NEEDED 03/25/19  Yes Boscia, Heather E, NP  sulfamethoxazole-trimethoprim (BACTRIM DS) 800-160 MG tablet Take 1 tablet by mouth 2 (two) times daily. 02/05/19  Yes Boscia, Greer Ee, NP  tiZANidine (ZANAFLEX) 2 MG tablet Take 1 tablet (2 mg total) by mouth 2 (two) times daily as needed for muscle spasms. 12/28/17  Yes Ronnell Freshwater, NP  zolpidem (AMBIEN) 10 MG tablet Take 1 tablet (10 mg total) by mouth at bedtime as needed for sleep. 12/26/18  Yes Scarboro, Audie Clear, NP  promethazine (PHENERGAN) 25 MG tablet Take 1 tablet (25 mg total) by mouth 2 (two) times daily as needed for nausea or vomiting. 08/07/18   Ronnell Freshwater, NP    Allergies as of 03/11/2019  . (No Known Allergies)    Family History  Problem  Relation Age of Onset  . Anxiety disorder Mother   . Colon cancer Mother   . Heart disease Father   . Anxiety disorder Father   . Obesity Sister   . Anxiety disorder Sister   . Depression Sister   . Osteoporosis Sister   . Breast cancer Paternal Aunt     Social History   Socioeconomic History  . Marital status: Widowed    Spouse name: Not on file  . Number of children: Not on file  . Years of education: Not on file  . Highest education level: Not on file  Occupational History  . Not on file  Tobacco Use  . Smoking status: Never Smoker  . Smokeless tobacco: Never Used  Substance and Sexual Activity  . Alcohol use: No    Alcohol/week: 0.0 standard drinks  . Drug use: Never  . Sexual activity: Never  Other Topics Concern  . Not on file  Social History Narrative  . Not on file   Social Determinants of Health   Financial Resource Strain:   . Difficulty of Paying Living Expenses: Not on file  Food Insecurity:   . Worried About Charity fundraiser in the Last Year: Not on file  . Ran Out of Food in the Last Year: Not on file  Transportation Needs:   . Lack of Transportation (Medical): Not on file  . Lack of Transportation (Non-Medical): Not on file  Physical Activity:   . Days of Exercise per Week: Not on file  . Minutes of Exercise per Session: Not on file  Stress:   . Feeling of Stress : Not on file  Social Connections:   . Frequency of Communication with Friends and Family: Not on file  . Frequency of Social Gatherings with Friends and Family: Not on file  . Attends Religious Services: Not on file  . Active Member of Clubs or Organizations: Not on file  . Attends Archivist Meetings: Not on file  . Marital Status: Not on file  Intimate Partner Violence:   . Fear of Current or Ex-Partner: Not on file  . Emotionally Abused: Not on file  . Physically Abused: Not on file  . Sexually Abused: Not on file    Review of Systems: See HPI, otherwise  negative ROS  Physical Exam: BP 126/68   Pulse 73   Temp (!) 96.5 F (35.8 C) (Temporal)   Resp 18   Ht 5' (1.524 m)   Wt 78.2 kg   SpO2 97%   BMI 33.66 kg/m  General:   Alert,  pleasant and cooperative in NAD Head:  Normocephalic and atraumatic. Neck:  Supple; no masses  or thyromegaly. Lungs:  Clear throughout to auscultation, normal respiratory effort.    Heart:  +S1, +S2, Regular rate and rhythm, No edema. Abdomen:  Soft, nontender and nondistended. Normal bowel sounds, without guarding, and without rebound.   Neurologic:  Alert and  oriented x4;  grossly normal neurologically.  Impression/Plan: Keyahna Harten Claassen is here for an colonoscopy to be performed for Screening colonoscopy prior history of colon polyps Risks, benefits, limitations, and alternatives regarding  colonoscopy have been reviewed with the patient.  Questions have been answered.  All parties agreeable.   Jonathon Bellows, MD  04/12/2019, 9:16 AM

## 2019-04-12 NOTE — Anesthesia Procedure Notes (Signed)
Date/Time: 04/12/2019 9:20 AM Performed by: Johnna Acosta, CRNA Pre-anesthesia Checklist: Patient identified, Emergency Drugs available, Suction available, Patient being monitored and Timeout performed Patient Re-evaluated:Patient Re-evaluated prior to induction Oxygen Delivery Method: Nasal cannula Preoxygenation: Pre-oxygenation with 100% oxygen Induction Type: IV induction

## 2019-04-13 ENCOUNTER — Other Ambulatory Visit: Payer: Self-pay | Admitting: Adult Health

## 2019-04-13 DIAGNOSIS — G479 Sleep disorder, unspecified: Secondary | ICD-10-CM

## 2019-04-15 ENCOUNTER — Other Ambulatory Visit: Payer: Self-pay

## 2019-04-15 ENCOUNTER — Encounter: Payer: Self-pay | Admitting: *Deleted

## 2019-04-15 LAB — SURGICAL PATHOLOGY

## 2019-04-15 MED ORDER — LEVOTHYROXINE SODIUM 50 MCG PO TABS: 50.0000 ug | ORAL_TABLET | Freq: Every morning | ORAL | 1 refills | Status: DC

## 2019-04-15 NOTE — Telephone Encounter (Signed)
Pt needs refill

## 2019-04-23 ENCOUNTER — Encounter: Payer: Self-pay | Admitting: Gastroenterology

## 2019-04-26 ENCOUNTER — Other Ambulatory Visit: Payer: Self-pay

## 2019-04-26 ENCOUNTER — Ambulatory Visit: Payer: Medicare Other

## 2019-04-26 DIAGNOSIS — I341 Nonrheumatic mitral (valve) prolapse: Secondary | ICD-10-CM

## 2019-04-29 NOTE — Progress Notes (Signed)
Review with patient at visit 05/03/2019

## 2019-05-01 ENCOUNTER — Telehealth: Payer: Self-pay

## 2019-05-01 NOTE — Telephone Encounter (Signed)
CONFIRMED AND SCREENED FOR 05-03-19 OV.

## 2019-05-03 ENCOUNTER — Encounter: Payer: Self-pay | Admitting: Nurse Practitioner

## 2019-05-03 ENCOUNTER — Other Ambulatory Visit: Payer: Self-pay

## 2019-05-03 ENCOUNTER — Ambulatory Visit (INDEPENDENT_AMBULATORY_CARE_PROVIDER_SITE_OTHER): Payer: Medicare Other | Admitting: Nurse Practitioner

## 2019-05-03 VITALS — BP 122/52 | HR 66 | Temp 97.2°F | Resp 16 | Ht 60.0 in | Wt 175.0 lb

## 2019-05-03 DIAGNOSIS — E1165 Type 2 diabetes mellitus with hyperglycemia: Secondary | ICD-10-CM

## 2019-05-03 DIAGNOSIS — K219 Gastro-esophageal reflux disease without esophagitis: Secondary | ICD-10-CM

## 2019-05-03 DIAGNOSIS — I10 Essential (primary) hypertension: Secondary | ICD-10-CM | POA: Diagnosis not present

## 2019-05-03 DIAGNOSIS — I341 Nonrheumatic mitral (valve) prolapse: Secondary | ICD-10-CM

## 2019-05-03 NOTE — Progress Notes (Signed)
[facility] [place], [place] [address]  Internal MEDICINE  Office Visit Note  Patient Name: [name]  [id number]  [id number]  Date of Service: 05/18/2019  Chief Complaint  Patient presents with  . Follow-up    echo results  . Depression  . Diabetes  . Gastroesophageal Reflux  . Hypertension    The patient is here for follow up visit. Blood pressure and heart rate are doing well on current dose metoprolol 50mg  twice daily. She denies continued palpitations, chest pain, chest pressure, or shortness of breath. She had echocardiogram since her last visit and she is here to review results. She has normal LVEF with mild LVH. She has diastolic dysfunction an mild valvular regurgitation.  She has been diagnosed with sleep apnea in the past and is unable to tolerate treatment with CPAP. Today, she states that she feels well and has no new concerns or complaints.  She had colonoscopy since her last visit. This showed two 5 to 7 mm polyps in the descending colon and in the ascending colon.  Diverticulosis in the sigmoid colon.      Current Medication: Outpatient Encounter Medications as of 05/03/2019  Medication Sig  . Accu-Chek FastClix Lancets MISC 1 each by Does not apply route 2 (two) times daily. Use as directed twice daily diag 111.65  . alendronate (FOSAMAX) 35 MG tablet Take 1 tablet (35 mg total) by mouth every 7 (seven) days. Take with a full glass of water on an empty stomach.  Marland Kitchen aspirin 81 MG tablet Take 1 tablet by mouth daily.  Marland Kitchen atorvastatin (LIPITOR) 20 MG tablet Take 1 tablet (20 mg total) by mouth daily.  . busPIRone (BUSPAR) 10 MG tablet Take 1/2 to 1 tablet by mouth twice daily.  . citalopram (CELEXA) 40 MG tablet Take 1 tablet (40 mg total) by mouth daily.  Stasia Cavalier (EUCRISA) 2 % OINT Apply 1 application topically 2 (two) times daily as needed.  Marland Kitchen FLUAD 0.5 ML SUSY ADM 0.5ML IM UTD  . Fluocinolone Acetonide 0.01 % OIL Place 5 drops  in ear(s) 2 (two) times a day.  Marland Kitchen glucose blood (ACCU-CHEK GUIDE) test strip Use as directed twice a day diag e11.65  . levothyroxine (SYNTHROID) 50 MCG tablet Take 1 tablet (50 mcg total) by mouth every morning.  Marland Kitchen lisinopril (ZESTRIL) 20 MG tablet TAKE 1 TABLET(20 MG) BY MOUTH DAILY  . metFORMIN (GLUCOPHAGE) 500 MG tablet Take 1 tablet (500 mg total) by mouth 2 (two) times daily with a meal.  . metoprolol tartrate (LOPRESSOR) 25 MG tablet Take 1 tablet (25 mg total) by mouth 2 (two) times daily.  . pantoprazole (PROTONIX) 40 MG tablet Take 1 tablet (40 mg total) by mouth 2 (two) times daily.  . promethazine (PHENERGAN) 25 MG tablet Take 1 tablet (25 mg total) by mouth 2 (two) times daily as needed for nausea or vomiting.  Marland Kitchen QUEtiapine (SEROQUEL) 50 MG tablet Take 1 tablet (50 mg total) by mouth 2 (two) times daily.  Marland Kitchen rOPINIRole (REQUIP) 0.25 MG tablet Take 1 tablet (0.25 mg total) by mouth 2 times daily at 12 noon and 4 pm.  . sucralfate (CARAFATE) 1 g tablet TAKE 1 TABLET BY MOUTH FOUR TIMES A DAY IF NEEDED  . sulfamethoxazole-trimethoprim (BACTRIM DS) 800-160 MG tablet Take 1 tablet by mouth 2 (two) times daily.  Marland Kitchen tiZANidine (ZANAFLEX) 2 MG tablet Take 1 tablet (2 mg total) by mouth 2 (two) times daily as needed for muscle  spasms.  Marland Kitchen zolpidem (AMBIEN) 10 MG tablet TAKE 1 TABLET(10 MG) BY MOUTH AT BEDTIME AS NEEDED FOR SLEEP  . [DISCONTINUED] diltiazem (CARDIZEM CD) 240 MG 24 hr capsule TAKE 1 CAPSULE(240 MG) BY MOUTH DAILY   No facility-administered encounter medications on file as of 05/03/2019.    Surgical History: Past Surgical History:  Procedure Laterality Date  . ABDOMINAL HYSTERECTOMY    . BREAST EXCISIONAL BIOPSY Bilateral 80s   benign  . BREAST SURGERY    . COLONOSCOPY WITH PROPOFOL N/A 04/12/2019   Procedure: COLONOSCOPY WITH PROPOFOL;  Surgeon: Jonathon Bellows, MD;  Location: [facility] ENDOSCOPY;  Service: Gastroenterology;  Laterality: N/A;  . JOINT REPLACEMENT Right 2019     Medical History: Past Medical History:  Diagnosis Date  . Anxiety   . Depression   . Diabetes mellitus, type II ([place])   . GERD (gastroesophageal reflux disease)   . Hypertension   . Thyroid disease     Family History: Family History  Problem Relation Age of Onset  . Anxiety disorder Mother   . Colon cancer Mother   . Heart disease Father   . Anxiety disorder Father   . Obesity Sister   . Anxiety disorder Sister   . Depression Sister   . Osteoporosis Sister   . Breast cancer Paternal Aunt     Social History   Socioeconomic History  . Marital status: Widowed    Spouse name: Not on file  . Number of children: Not on file  . Years of education: Not on file  . Highest education level: Not on file  Occupational History  . Not on file  Tobacco Use  . Smoking status: Never Smoker  . Smokeless tobacco: Never Used  Substance and Sexual Activity  . Alcohol use: No    Alcohol/week: 0.0 standard drinks  . Drug use: Never  . Sexual activity: Never  Other Topics Concern  . Not on file  Social History Narrative  . Not on file   Social Determinants of Health   Financial Resource Strain:   . Difficulty of Paying Living Expenses:   Food Insecurity:   . Worried About Charity fundraiser in the Last Year:   . Arboriculturist in the Last Year:   Transportation Needs:   . Film/video editor (Medical):   Marland Kitchen Lack of Transportation (Non-Medical):   Physical Activity:   . Days of Exercise per Week:   . Minutes of Exercise per Session:   Stress:   . Feeling of Stress :   Social Connections:   . Frequency of Communication with Friends and Family:   . Frequency of Social Gatherings with Friends and Family:   . Attends Religious Services:   . Active Member of Clubs or Organizations:   . Attends Archivist Meetings:   Marland Kitchen Marital Status:   Intimate Partner Violence:   . Fear of Current or Ex-Partner:   . Emotionally Abused:   Marland Kitchen Physically Abused:   .  Sexually Abused:       Review of Systems  Constitutional: Positive for fatigue. Negative for activity change, chills and unexpected weight change.  HENT: Negative for congestion, postnasal drip, rhinorrhea, sneezing and sore throat.   Respiratory: Negative for cough, chest tightness, shortness of breath and wheezing.   Cardiovascular: Negative for chest pain and palpitations.       Blood pressure and heart rate well controlled.   Gastrointestinal: Negative for abdominal pain, constipation, diarrhea, nausea and vomiting.  Endocrine: Negative for cold intolerance, heat intolerance, polydipsia and polyuria.  Musculoskeletal: Negative for arthralgias, back pain, joint swelling and neck pain.  Skin: Negative for rash.  Allergic/Immunologic: Negative for environmental allergies.  Neurological: Negative for dizziness, tremors, numbness and headaches.  Hematological: Negative for adenopathy. Does not bruise/bleed easily.  Psychiatric/Behavioral: Positive for dysphoric mood. Negative for behavioral problems (Depression), sleep disturbance and suicidal ideas. The patient is nervous/anxious.     Today's Vitals   05/03/19 1111  BP: (!) 122/52  Pulse: 66  Resp: 16  Temp: (!) 97.2 F (36.2 C)  SpO2: 94%  Weight: 175 lb (79.4 kg)  Height: 5' (1.524 m)   Body mass index is 34.18 kg/m.  Physical Exam Vitals and nursing note reviewed.  Constitutional:      General: She is not in acute distress.    Appearance: Normal appearance. She is well-developed. She is not diaphoretic.  HENT:     Head: Normocephalic and atraumatic.     Nose: Nose normal.     Mouth/Throat:     Pharynx: No oropharyngeal exudate.  Eyes:     Conjunctiva/sclera: Conjunctivae normal.     Pupils: Pupils are equal, round, and reactive to light.  Neck:     Thyroid: No thyromegaly.     Vascular: No carotid bruit or JVD.     Trachea: No tracheal deviation.  Cardiovascular:     Rate and Rhythm: Normal rate and regular  rhythm.     Heart sounds: Normal heart sounds. No murmur. No friction rub. No gallop.   Pulmonary:     Effort: Pulmonary effort is normal. No respiratory distress.     Breath sounds: Normal breath sounds. No wheezing or rales.  Chest:     Chest wall: No tenderness.  Abdominal:     Palpations: Abdomen is soft.  Musculoskeletal:        General: Normal range of motion.     Cervical back: Normal range of motion and neck supple.  Lymphadenopathy:     Cervical: No cervical adenopathy.  Skin:    General: Skin is warm and dry.  Neurological:     Mental Status: She is alert and oriented to person, place, and time. Mental status is at baseline.     Cranial Nerves: No cranial nerve deficit.  Psychiatric:        Mood and Affect: Mood normal.        Behavior: Behavior normal.        Thought Content: Thought content normal.        Judgment: Judgment normal.    Assessment/Plan: 1. Essential hypertension Improved. Continue blood pressure medications at current doses.   2. Mitral valve prolapse Reviewed echocardiogram with the patient. She has normal LVEF with mild LVH. There is diastolic dysfunction and valvular regurgitation. Will continue to monitor.   3. Gastroesophageal reflux disease without esophagitis Continue pantoprazole as prescribed   4. Type 2 diabetes mellitus with hyperglycemia, without long-term current use of insulin ([place]) Continue diabetic medication as prescribed   General Counseling: Charlie verbalizes understanding of the findings of todays visit and agrees with plan of treatment. I have discussed any further diagnostic evaluation that may be needed or ordered today. We also reviewed her medications today. she has been encouraged to call the office with any questions or concerns that should arise related to todays visit.   Hypertension Counseling:   The following hypertensive lifestyle modification were recommended and discussed:  1. Limiting alcohol intake to less  than  1 oz/day of ethanol:(24 oz of beer or 8 oz of wine or 2 oz of 100-proof whiskey). 2. Take baby ASA 81 mg daily. 3. Importance of regular aerobic exercise and losing weight. 4. Reduce dietary saturated fat and cholesterol intake for overall cardiovascular health. 5. Maintaining adequate dietary potassium, calcium, and magnesium intake. 6. Regular monitoring of the blood pressure. 7. Reduce sodium intake to less than 100 mmol/day (less than 2.3 gm of sodium or less than 6 gm of sodium choride)   This patient was seen by Russell with Dr Lavera Guise as a part of collaborative care agreement  Total time spent: 20 Minutes  Time spent includes review of chart, medications, test results, and follow up plan with the patient.      Dr Lavera Guise Internal medicine

## 2019-05-14 ENCOUNTER — Other Ambulatory Visit: Payer: Self-pay

## 2019-05-14 DIAGNOSIS — I10 Essential (primary) hypertension: Secondary | ICD-10-CM

## 2019-05-14 MED ORDER — DILTIAZEM HCL ER COATED BEADS 240 MG PO CP24: ORAL_CAPSULE | ORAL | 1 refills | Status: DC

## 2019-05-20 ENCOUNTER — Other Ambulatory Visit: Payer: Self-pay

## 2019-05-20 DIAGNOSIS — M5440 Lumbago with sciatica, unspecified side: Secondary | ICD-10-CM

## 2019-05-20 MED ORDER — TIZANIDINE HCL 2 MG PO TABS: 2.0000 mg | ORAL_TABLET | Freq: Two times a day (BID) | ORAL | 1 refills | Status: DC | PRN

## 2019-05-27 ENCOUNTER — Other Ambulatory Visit: Payer: Self-pay

## 2019-05-27 MED ORDER — PANTOPRAZOLE SODIUM 40 MG PO TBEC: 40.0000 mg | DELAYED_RELEASE_TABLET | Freq: Two times a day (BID) | ORAL | 1 refills | Status: DC

## 2019-06-20 ENCOUNTER — Other Ambulatory Visit: Payer: Self-pay

## 2019-06-20 MED ORDER — PROMETHAZINE HCL 25 MG PO TABS: 25.0000 mg | ORAL_TABLET | Freq: Two times a day (BID) | ORAL | 3 refills | Status: DC | PRN

## 2019-07-02 ENCOUNTER — Ambulatory Visit: Payer: Medicare Other | Admitting: Nurse Practitioner

## 2019-07-10 ENCOUNTER — Telehealth: Payer: Self-pay

## 2019-07-10 NOTE — Telephone Encounter (Signed)
Confirmed and screened for 07-12-19 ov. 

## 2019-07-12 ENCOUNTER — Other Ambulatory Visit: Payer: Self-pay

## 2019-07-12 ENCOUNTER — Ambulatory Visit (INDEPENDENT_AMBULATORY_CARE_PROVIDER_SITE_OTHER): Payer: Medicare Other | Admitting: Nurse Practitioner

## 2019-07-12 ENCOUNTER — Encounter: Payer: Self-pay | Admitting: Nurse Practitioner

## 2019-07-12 VITALS — BP 140/68 | HR 62 | Temp 97.2°F | Resp 16 | Ht 60.0 in | Wt 176.0 lb

## 2019-07-12 DIAGNOSIS — I10 Essential (primary) hypertension: Secondary | ICD-10-CM | POA: Diagnosis not present

## 2019-07-12 DIAGNOSIS — G2581 Restless legs syndrome: Secondary | ICD-10-CM | POA: Diagnosis not present

## 2019-07-12 DIAGNOSIS — R5383 Other fatigue: Secondary | ICD-10-CM

## 2019-07-12 DIAGNOSIS — E1165 Type 2 diabetes mellitus with hyperglycemia: Secondary | ICD-10-CM | POA: Diagnosis not present

## 2019-07-12 DIAGNOSIS — G479 Sleep disorder, unspecified: Secondary | ICD-10-CM

## 2019-07-12 LAB — POCT GLYCOSYLATED HEMOGLOBIN (HGB A1C): Hemoglobin A1C: 7.4 % — AB (ref 4.0–5.6)

## 2019-07-12 MED ORDER — ZOLPIDEM TARTRATE 10 MG PO TABS: 10.0000 mg | ORAL_TABLET | Freq: Every evening | ORAL | 2 refills | Status: DC | PRN

## 2019-07-12 NOTE — Progress Notes (Signed)
[facility] [place],  [address]  Internal MEDICINE  Office Visit Note  Patient Name: [name]  [id number]  [id number]  Date of Service: 07/17/2019  Chief Complaint  Patient presents with  . Follow-up  . Diabetes  . Depression  . Hypertension  . Gastroesophageal Reflux    The patient is here for routine follow up visit. She states that she is doing well. Blood sugars are basically stable. HgbA1c is 7.4 today, up from 7.2 at the last check. Blood pressure is doing well. States that she felt as though she were getting a cold this morning. Took benadryl and feels better now.  She does take [place] 10mg  most nights prior to sleep. States that she is able to fall asleep about 40 minutes. She states that she has not dizziness or trouble walking after taking this medication. We have tried alternative therapies, but they have not been effective.       Current Medication: Outpatient Encounter Medications as of 07/12/2019  Medication Sig  . Accu-Chek FastClix Lancets MISC 1 each by Does not apply route 2 (two) times daily. Use as directed twice daily diag 111.65  . alendronate (FOSAMAX) 35 MG tablet Take 1 tablet (35 mg total) by mouth every 7 (seven) days. Take with a full glass of water on an empty stomach.  Marland Kitchen aspirin 81 MG tablet Take 1 tablet by mouth daily.  Marland Kitchen atorvastatin (LIPITOR) 20 MG tablet Take 1 tablet (20 mg total) by mouth daily.  . busPIRone (BUSPAR) 10 MG tablet Take 1/2 to 1 tablet by mouth twice daily.  . citalopram (CELEXA) 40 MG tablet Take 1 tablet (40 mg total) by mouth daily.  Stasia Cavalier (EUCRISA) 2 % OINT Apply 1 application topically 2 (two) times daily as needed.  . diltiazem (CARDIZEM CD) 240 MG 24 hr capsule TAKE 1 CAPSULE(240 MG) BY MOUTH DAILY  . FLUAD 0.5 ML SUSY ADM 0.5ML IM UTD  . Fluocinolone Acetonide 0.01 % OIL Place 5 drops in ear(s) 2 (two) times a day.  Marland Kitchen glucose blood (ACCU-CHEK GUIDE) test strip Use as  directed twice a day diag e11.65  . levothyroxine (SYNTHROID) 50 MCG tablet Take 1 tablet (50 mcg total) by mouth every morning.  Marland Kitchen lisinopril (ZESTRIL) 20 MG tablet TAKE 1 TABLET(20 MG) BY MOUTH DAILY  . metFORMIN (GLUCOPHAGE) 500 MG tablet Take 1 tablet (500 mg total) by mouth 2 (two) times daily with a meal.  . metoprolol tartrate (LOPRESSOR) 25 MG tablet Take 1 tablet (25 mg total) by mouth 2 (two) times daily.  . pantoprazole (PROTONIX) 40 MG tablet Take 1 tablet (40 mg total) by mouth 2 (two) times daily.  . promethazine (PHENERGAN) 25 MG tablet Take 1 tablet (25 mg total) by mouth 2 (two) times daily as needed for nausea or vomiting.  Marland Kitchen QUEtiapine (SEROQUEL) 50 MG tablet Take 1 tablet (50 mg total) by mouth 2 (two) times daily.  Marland Kitchen rOPINIRole (REQUIP) 0.25 MG tablet Take 1 tablet (0.25 mg total) by mouth 2 times daily at 12 noon and 4 pm.  . sucralfate (CARAFATE) 1 g tablet TAKE 1 TABLET BY MOUTH FOUR TIMES A DAY IF NEEDED  . sulfamethoxazole-trimethoprim (BACTRIM DS) 800-160 MG tablet Take 1 tablet by mouth 2 (two) times daily.  Marland Kitchen tiZANidine (ZANAFLEX) 2 MG tablet Take 1 tablet (2 mg total) by mouth 2 (two) times daily as needed for muscle spasms.  Marland Kitchen zolpidem (AMBIEN) 10 MG tablet Take 1 tablet (10  mg total) by mouth at bedtime as needed for sleep.  . [DISCONTINUED] zolpidem (AMBIEN) 10 MG tablet TAKE 1 TABLET(10 MG) BY MOUTH AT BEDTIME AS NEEDED FOR SLEEP   No facility-administered encounter medications on file as of 07/12/2019.    Surgical History: Past Surgical History:  Procedure Laterality Date  . ABDOMINAL HYSTERECTOMY    . BREAST EXCISIONAL BIOPSY Bilateral 80s   benign  . BREAST SURGERY    . COLONOSCOPY WITH PROPOFOL N/A 04/12/2019   Procedure: COLONOSCOPY WITH PROPOFOL;  Surgeon: Jonathon Bellows, MD;  Location: [facility] ENDOSCOPY;  Service: Gastroenterology;  Laterality: N/A;  . JOINT REPLACEMENT Right 2019    Medical History: Past Medical History:  Diagnosis Date  . Anxiety   .  Depression   . Diabetes mellitus, type II ([place])   . GERD (gastroesophageal reflux disease)   . Hypertension   . Thyroid disease     Family History: Family History  Problem Relation Age of Onset  . Anxiety disorder Mother   . Colon cancer Mother   . Heart disease Father   . Anxiety disorder Father   . Obesity Sister   . Anxiety disorder Sister   . Depression Sister   . Osteoporosis Sister   . Breast cancer Paternal Aunt     Social History   Socioeconomic History  . Marital status: Widowed    Spouse name: Not on file  . Number of children: Not on file  . Years of education: Not on file  . Highest education level: Not on file  Occupational History  . Not on file  Tobacco Use  . Smoking status: Never Smoker  . Smokeless tobacco: Never Used  Substance and Sexual Activity  . Alcohol use: No    Alcohol/week: 0.0 standard drinks  . Drug use: Never  . Sexual activity: Never  Other Topics Concern  . Not on file  Social History Narrative  . Not on file   Social Determinants of Health   Financial Resource Strain:   . Difficulty of Paying Living Expenses:   Food Insecurity:   . Worried About Charity fundraiser in the Last Year:   . Arboriculturist in the Last Year:   Transportation Needs:   . Film/video editor (Medical):   Marland Kitchen Lack of Transportation (Non-Medical):   Physical Activity:   . Days of Exercise per Week:   . Minutes of Exercise per Session:   Stress:   . Feeling of Stress :   Social Connections:   . Frequency of Communication with Friends and Family:   . Frequency of Social Gatherings with Friends and Family:   . Attends Religious Services:   . Active Member of Clubs or Organizations:   . Attends Archivist Meetings:   Marland Kitchen Marital Status:   Intimate Partner Violence:   . Fear of Current or Ex-Partner:   . Emotionally Abused:   Marland Kitchen Physically Abused:   . Sexually Abused:       Review of Systems  Constitutional: Negative for activity  change, chills, fatigue and unexpected weight change.  HENT: Negative for congestion, postnasal drip, rhinorrhea, sneezing and sore throat.   Respiratory: Negative for cough, chest tightness, shortness of breath and wheezing.   Cardiovascular: Negative for chest pain and palpitations.       Blood pressure and heart rate well controlled.   Gastrointestinal: Negative for abdominal pain, constipation, diarrhea, nausea and vomiting.  Endocrine: Negative for cold intolerance, heat intolerance, polydipsia and polyuria.  Blood sugars stable.  Musculoskeletal: Negative for arthralgias, back pain, joint swelling and neck pain.  Skin: Negative for rash.  Allergic/Immunologic: Negative for environmental allergies.  Neurological: Negative for dizziness, tremors, numbness and headaches.  Hematological: Negative for adenopathy. Does not bruise/bleed easily.  Psychiatric/Behavioral: Positive for dysphoric mood. Negative for behavioral problems (Depression), sleep disturbance and suicidal ideas. The patient is nervous/anxious.     Today's Vitals   07/12/19 1432  BP: 140/68  Pulse: 62  Resp: 16  Temp: (!) 97.2 F (36.2 C)  SpO2: 95%  Weight: 176 lb (79.8 kg)  Height: 5' (1.524 m)   Body mass index is 34.37 kg/m.  Physical Exam Vitals and nursing note reviewed.  Constitutional:      General: She is not in acute distress.    Appearance: Normal appearance. She is well-developed. She is not diaphoretic.  HENT:     Head: Normocephalic and atraumatic.     Nose: Nose normal.     Mouth/Throat:     Pharynx: No oropharyngeal exudate.  Eyes:     Conjunctiva/sclera: Conjunctivae normal.     Pupils: Pupils are equal, round, and reactive to light.  Neck:     Thyroid: No thyromegaly.     Vascular: No carotid bruit or JVD.     Trachea: No tracheal deviation.  Cardiovascular:     Rate and Rhythm: Normal rate and regular rhythm.     Heart sounds: Normal heart sounds. No murmur. No friction rub. No  gallop.   Pulmonary:     Effort: Pulmonary effort is normal. No respiratory distress.     Breath sounds: Normal breath sounds. No wheezing or rales.  Chest:     Chest wall: No tenderness.  Abdominal:     Palpations: Abdomen is soft.  Musculoskeletal:        General: Normal range of motion.     Cervical back: Normal range of motion and neck supple.  Lymphadenopathy:     Cervical: No cervical adenopathy.  Skin:    General: Skin is warm and dry.  Neurological:     Mental Status: She is alert and oriented to person, place, and time. Mental status is at baseline.     Cranial Nerves: No cranial nerve deficit.  Psychiatric:        Mood and Affect: Mood normal.        Behavior: Behavior normal.        Thought Content: Thought content normal.        Judgment: Judgment normal.   Assessment/Plan:  1. Type 2 diabetes mellitus with hyperglycemia, without long-term current use of insulin (HCC) - POCT HgB A1C 7.4 today. Continue diabetic medication as prescribed. Discussed limiting carbohydrates and sweets in the diet and increasing exercise to lower blood sugars without changing medication. Monitor closely. Check HgbA1c at next visit.   2. Essential hypertension Stable. Continue bp medication as prescribed   3. Fatigue due to sleep pattern disturbance May take ambien 10mg  at bedtime as needed for insomnia. New prescription sent to her pharmacy today.  - zolpidem (AMBIEN) 10 MG tablet; Take 1 tablet (10 mg total) by mouth at bedtime as needed for sleep.  Dispense: 30 tablet; Refill: 2  4. Restless leg syndrome Take requip as prescribed.   General Counseling: Yaniyah verbalizes understanding of the findings of todays visit and agrees with plan of treatment. I have discussed any further diagnostic evaluation that may be needed or ordered today. We also reviewed her medications today. she has  been encouraged to call the office with any questions or concerns that should arise related to todays  visit.  Diabetes Counseling:  1. Addition of ACE inh/ ARB'S for nephroprotection. Microalbumin is updated  2. Diabetic foot care, prevention of complications. Podiatry consult 3. Exercise and lose weight.  4. Diabetic eye examination, Diabetic eye exam is updated  5. Monitor blood sugar closlely. nutrition counseling.  6. Sign and symptoms of hypoglycemia including shaking sweating,confusion and headaches.  This patient was seen by Leretha Pol FNP Collaboration with Dr Lavera Guise as a part of collaborative care agreement  Orders Placed This Encounter  Procedures  . POCT HgB A1C    Meds ordered this encounter  Medications  . zolpidem (AMBIEN) 10 MG tablet    Sig: Take 1 tablet (10 mg total) by mouth at bedtime as needed for sleep.    Dispense:  30 tablet    Refill:  2    Order Specific Question:   Supervising Provider    Answer:   Lavera Guise [[id number]]    Total time spent: 30 Minutes   Time spent includes review of chart, medications, test results, and follow up plan with the patient.      Dr Lavera Guise Internal medicine

## 2019-08-08 ENCOUNTER — Other Ambulatory Visit: Payer: Self-pay

## 2019-08-08 MED ORDER — CITALOPRAM HYDROBROMIDE 40 MG PO TABS: 40.0000 mg | ORAL_TABLET | Freq: Every day | ORAL | 1 refills | Status: DC

## 2019-08-28 ENCOUNTER — Other Ambulatory Visit: Payer: Self-pay

## 2019-08-28 DIAGNOSIS — M810 Age-related osteoporosis without current pathological fracture: Secondary | ICD-10-CM

## 2019-08-28 DIAGNOSIS — F322 Major depressive disorder, single episode, severe without psychotic features: Secondary | ICD-10-CM

## 2019-08-28 MED ORDER — QUETIAPINE FUMARATE 50 MG PO TABS: 50.0000 mg | ORAL_TABLET | Freq: Two times a day (BID) | ORAL | 1 refills | Status: DC

## 2019-08-28 MED ORDER — ALENDRONATE SODIUM 35 MG PO TABS: 35.0000 mg | ORAL_TABLET | ORAL | 5 refills | Status: DC

## 2019-09-02 ENCOUNTER — Telehealth: Payer: Self-pay

## 2019-09-02 NOTE — Telephone Encounter (Signed)
Tried contacting patient to confirm appointment on 09/04/2019 no voicemail. klh

## 2019-09-03 ENCOUNTER — Telehealth: Payer: Self-pay

## 2019-09-03 NOTE — Telephone Encounter (Signed)
Unable to confirm appointment on 09/04/2019 no voicemail. klh

## 2019-09-03 NOTE — Telephone Encounter (Signed)
Confirmed and screened for 09-04-19 ov.

## 2019-09-04 ENCOUNTER — Encounter: Payer: Self-pay | Admitting: Adult Health

## 2019-09-04 ENCOUNTER — Ambulatory Visit (INDEPENDENT_AMBULATORY_CARE_PROVIDER_SITE_OTHER): Payer: Medicare Other | Admitting: Adult Health

## 2019-09-04 ENCOUNTER — Other Ambulatory Visit: Payer: Self-pay

## 2019-09-04 VITALS — BP 164/80 | HR 71 | Temp 97.0°F | Resp 16 | Ht 60.0 in | Wt 172.0 lb

## 2019-09-04 DIAGNOSIS — H6123 Impacted cerumen, bilateral: Secondary | ICD-10-CM

## 2019-09-04 DIAGNOSIS — I10 Essential (primary) hypertension: Secondary | ICD-10-CM

## 2019-09-04 DIAGNOSIS — G479 Sleep disorder, unspecified: Secondary | ICD-10-CM

## 2019-09-04 DIAGNOSIS — R5383 Other fatigue: Secondary | ICD-10-CM

## 2019-09-04 MED ORDER — ZOLPIDEM TARTRATE 10 MG PO TABS: 10.0000 mg | ORAL_TABLET | Freq: Every evening | ORAL | 0 refills | Status: DC | PRN

## 2019-09-04 MED ORDER — BUSPIRONE HCL 10 MG PO TABS: ORAL_TABLET | ORAL | 1 refills | Status: DC

## 2019-09-04 MED ORDER — METOPROLOL TARTRATE 25 MG PO TABS: 25.0000 mg | ORAL_TABLET | Freq: Two times a day (BID) | ORAL | 1 refills | Status: DC

## 2019-09-04 MED ORDER — LEVOTHYROXINE SODIUM 50 MCG PO TABS: 50.0000 ug | ORAL_TABLET | Freq: Every morning | ORAL | 1 refills | Status: DC

## 2019-09-04 MED ORDER — ATORVASTATIN CALCIUM 20 MG PO TABS: 20.0000 mg | ORAL_TABLET | Freq: Every day | ORAL | 1 refills | Status: DC

## 2019-09-04 NOTE — Progress Notes (Signed)
[facility] [place], [place] [address]  Internal MEDICINE  Office Visit Note  Patient Name: [name] Basic  [id number]  [id number]  Date of Service: 09/04/2019  Chief Complaint  Patient presents with  . Foot Swelling    right foot   . Dizziness    when standing not sure if it is her ears or what  . Pruritis    scalp and in both ears  . Medication Refill     HPI Pt is here for a sick visit. Pt reports for many months she has noticed that she becomes dizzy briefly, while standing up from sitting in the car.  It is brief, and is self limiting. She is also complaining of itching around her scalp for about a year.  She denies any rash or dry skin. She also has itching in her ear canals.       Current Medication:  Outpatient Encounter Medications as of 09/04/2019  Medication Sig  . Accu-Chek FastClix Lancets MISC 1 each by Does not apply route 2 (two) times daily. Use as directed twice daily diag 111.65  . alendronate (FOSAMAX) 35 MG tablet Take 1 tablet (35 mg total) by mouth every 7 (seven) days. Take with a full glass of water on an empty stomach.  Marland Kitchen aspirin 81 MG tablet Take 1 tablet by mouth daily.  Marland Kitchen atorvastatin (LIPITOR) 20 MG tablet Take 1 tablet (20 mg total) by mouth daily.  . busPIRone (BUSPAR) 10 MG tablet Take 1/2 to 1 tablet by mouth twice daily.  . citalopram (CELEXA) 40 MG tablet Take 1 tablet (40 mg total) by mouth daily.  Stasia Cavalier (EUCRISA) 2 % OINT Apply 1 application topically 2 (two) times daily as needed.  . diltiazem (CARDIZEM CD) 240 MG 24 hr capsule TAKE 1 CAPSULE(240 MG) BY MOUTH DAILY  . FLUAD 0.5 ML SUSY ADM 0.5ML IM UTD  . Fluocinolone Acetonide 0.01 % OIL Place 5 drops in ear(s) 2 (two) times a day.  Marland Kitchen glucose blood (ACCU-CHEK GUIDE) test strip Use as directed twice a day diag e11.65  . levothyroxine (SYNTHROID) 50 MCG tablet Take 1 tablet (50 mcg total) by mouth every morning.  Marland Kitchen lisinopril (ZESTRIL) 20 MG tablet  TAKE 1 TABLET(20 MG) BY MOUTH DAILY  . meloxicam (MOBIC) 15 MG tablet Take 15 mg by mouth daily.  . metFORMIN (GLUCOPHAGE) 500 MG tablet Take 1 tablet (500 mg total) by mouth 2 (two) times daily with a meal.  . metoprolol tartrate (LOPRESSOR) 25 MG tablet Take 1 tablet (25 mg total) by mouth 2 (two) times daily.  . pantoprazole (PROTONIX) 40 MG tablet Take 1 tablet (40 mg total) by mouth 2 (two) times daily.  . promethazine (PHENERGAN) 25 MG tablet Take 1 tablet (25 mg total) by mouth 2 (two) times daily as needed for nausea or vomiting.  Marland Kitchen QUEtiapine (SEROQUEL) 50 MG tablet Take 1 tablet (50 mg total) by mouth 2 (two) times daily.  Marland Kitchen rOPINIRole (REQUIP) 0.25 MG tablet Take 1 tablet (0.25 mg total) by mouth 2 times daily at 12 noon and 4 pm.  . sucralfate (CARAFATE) 1 g tablet TAKE 1 TABLET BY MOUTH FOUR TIMES A DAY IF NEEDED  . sulfamethoxazole-trimethoprim (BACTRIM DS) 800-160 MG tablet Take 1 tablet by mouth 2 (two) times daily.  Marland Kitchen tiZANidine (ZANAFLEX) 2 MG tablet Take 1 tablet (2 mg total) by mouth 2 (two) times daily as needed for muscle spasms.  Marland Kitchen zolpidem (AMBIEN) 10 MG tablet  Take 1 tablet (10 mg total) by mouth at bedtime as needed for sleep.  . [DISCONTINUED] zolpidem (AMBIEN) 10 MG tablet Take 1 tablet (10 mg total) by mouth at bedtime as needed for sleep.   No facility-administered encounter medications on file as of 09/04/2019.      Medical History: Past Medical History:  Diagnosis Date  . Anxiety   . Depression   . Diabetes mellitus, type II ([place])   . GERD (gastroesophageal reflux disease)   . Hypertension   . Thyroid disease      Vital Signs: BP (!) 164/80   Pulse 71   Temp (!) 97 F (36.1 C)   Resp 16   Ht 5' (1.524 m)   Wt 172 lb (78 kg)   SpO2 98%   BMI 33.59 kg/m    Review of Systems  Constitutional: Negative for chills, fatigue and unexpected weight change.  HENT: Negative for congestion, rhinorrhea, sneezing and sore throat.   Eyes: Negative for  photophobia, pain and redness.  Respiratory: Negative for cough, chest tightness and shortness of breath.   Cardiovascular: Negative for chest pain and palpitations.  Gastrointestinal: Negative for abdominal pain, constipation, diarrhea, nausea and vomiting.  Endocrine: Negative.   Genitourinary: Negative for dysuria and frequency.  Musculoskeletal: Negative for arthralgias, back pain, joint swelling and neck pain.  Skin: Negative for rash.  Allergic/Immunologic: Negative.   Neurological: Negative for tremors and numbness.  Hematological: Negative for adenopathy. Does not bruise/bleed easily.  Psychiatric/Behavioral: Negative for behavioral problems and sleep disturbance. The patient is not nervous/anxious.     Physical Exam Vitals and nursing note reviewed.  Constitutional:      General: She is not in acute distress.    Appearance: She is well-developed. She is not diaphoretic.  HENT:     Head: Normocephalic and atraumatic.     Mouth/Throat:     Pharynx: No oropharyngeal exudate.  Eyes:     Pupils: Pupils are equal, round, and reactive to light.  Neck:     Thyroid: No thyromegaly.     Vascular: No JVD.     Trachea: No tracheal deviation.  Cardiovascular:     Rate and Rhythm: Normal rate and regular rhythm.     Heart sounds: Normal heart sounds. No murmur heard.  No friction rub. No gallop.   Pulmonary:     Effort: Pulmonary effort is normal. No respiratory distress.     Breath sounds: Normal breath sounds. No wheezing or rales.  Chest:     Chest wall: No tenderness.  Abdominal:     Palpations: Abdomen is soft.     Tenderness: There is no abdominal tenderness. There is no guarding.  Musculoskeletal:        General: Normal range of motion.     Cervical back: Normal range of motion and neck supple.  Lymphadenopathy:     Cervical: No cervical adenopathy.  Skin:    General: Skin is warm and dry.  Neurological:     Mental Status: She is alert and oriented to person, place,  and time.     Cranial Nerves: No cranial nerve deficit.  Psychiatric:        Behavior: Behavior normal.        Thought Content: Thought content normal.        Judgment: Judgment normal.    Assessment/Plan: 1. Bilateral impacted cerumen Successful ear lavage. Pt will continue to monitor symptoms, including dizziness to see if they resolve.  Return to office if  symptoms worsen or fail to resolve.  - Ear Lavage  2. Fatigue due to sleep pattern disturbance Reviewed risks and possible side effects associated with taking opiates, benzodiazepines and other CNS depressants. Combination of these could cause dizziness and drowsiness. Advised patient not to drive or operate machinery when taking these medications, as patient's and other's life can be at risk and will have consequences. Patient verbalized understanding in this matter. Dependence and abuse for these drugs will be monitored closely. A Controlled substance policy and procedure is on file which allows [facility] to order a urine drug screen test at any visit. Patient understands and agrees with the plan - zolpidem (AMBIEN) 10 MG tablet; Take 1 tablet (10 mg total) by mouth at bedtime as needed for sleep.  Dispense: 60 tablet; Refill: 0  3. Essential hypertension Continue to monitor pressure at home  General Counseling: olita takeshita understanding of the findings of todays visit and agrees with plan of treatment. I have discussed any further diagnostic evaluation that may be needed or ordered today. We also reviewed her medications today. she has been encouraged to call the office with any questions or concerns that should arise related to todays visit.   No orders of the defined types were placed in this encounter.   Meds ordered this encounter  Medications  . zolpidem (AMBIEN) 10 MG tablet    Sig: Take 1 tablet (10 mg total) by mouth at bedtime as needed for sleep.    Dispense:  60 tablet    Refill:  0    Patient is  going to [place] for about a month, please fill 60 days this one time for coverage. In place of the 09/11/2019 Refill that is on file.    Time spent: 30 Minutes  This patient was seen by Orson Gear AGNP-C in Collaboration with Dr Lavera Guise as a part of collaborative care agreement.  Kendell Bane AGNP-C Internal Medicine

## 2019-09-23 ENCOUNTER — Other Ambulatory Visit: Payer: Self-pay

## 2019-09-23 DIAGNOSIS — I10 Essential (primary) hypertension: Secondary | ICD-10-CM

## 2019-09-23 MED ORDER — LISINOPRIL 20 MG PO TABS: ORAL_TABLET | ORAL | 3 refills | Status: DC

## 2019-10-18 ENCOUNTER — Ambulatory Visit: Payer: Medicare Other | Admitting: Nurse Practitioner

## 2019-11-11 ENCOUNTER — Ambulatory Visit: Payer: Medicare Other | Admitting: Nurse Practitioner

## 2019-11-11 ENCOUNTER — Other Ambulatory Visit: Payer: Self-pay

## 2019-11-11 DIAGNOSIS — I10 Essential (primary) hypertension: Secondary | ICD-10-CM

## 2019-11-11 MED ORDER — DILTIAZEM HCL ER COATED BEADS 240 MG PO CP24: ORAL_CAPSULE | ORAL | 1 refills | Status: DC

## 2019-11-11 MED ORDER — PANTOPRAZOLE SODIUM 40 MG PO TBEC: 40.0000 mg | DELAYED_RELEASE_TABLET | Freq: Two times a day (BID) | ORAL | 1 refills | Status: DC

## 2019-11-29 ENCOUNTER — Other Ambulatory Visit: Payer: Self-pay | Admitting: Adult Health

## 2019-12-09 ENCOUNTER — Ambulatory Visit (INDEPENDENT_AMBULATORY_CARE_PROVIDER_SITE_OTHER): Payer: Medicare Other | Admitting: Nurse Practitioner

## 2019-12-09 ENCOUNTER — Encounter: Payer: Self-pay | Admitting: Nurse Practitioner

## 2019-12-09 ENCOUNTER — Other Ambulatory Visit: Payer: Self-pay

## 2019-12-09 VITALS — BP 132/70 | HR 69 | Temp 97.5°F | Resp 16 | Ht 60.0 in | Wt 179.0 lb

## 2019-12-09 DIAGNOSIS — G2581 Restless legs syndrome: Secondary | ICD-10-CM

## 2019-12-09 DIAGNOSIS — R5383 Other fatigue: Secondary | ICD-10-CM

## 2019-12-09 DIAGNOSIS — G479 Sleep disorder, unspecified: Secondary | ICD-10-CM

## 2019-12-09 DIAGNOSIS — K219 Gastro-esophageal reflux disease without esophagitis: Secondary | ICD-10-CM | POA: Diagnosis not present

## 2019-12-09 DIAGNOSIS — I10 Essential (primary) hypertension: Secondary | ICD-10-CM

## 2019-12-09 DIAGNOSIS — H259 Unspecified age-related cataract: Secondary | ICD-10-CM | POA: Diagnosis not present

## 2019-12-09 DIAGNOSIS — E1165 Type 2 diabetes mellitus with hyperglycemia: Secondary | ICD-10-CM | POA: Diagnosis not present

## 2019-12-09 DIAGNOSIS — F322 Major depressive disorder, single episode, severe without psychotic features: Secondary | ICD-10-CM

## 2019-12-09 LAB — POCT GLYCOSYLATED HEMOGLOBIN (HGB A1C): Hemoglobin A1C: 6.9 % — AB (ref 4.0–5.6)

## 2019-12-09 MED ORDER — ROPINIROLE HCL 0.25 MG PO TABS: 0.2500 mg | ORAL_TABLET | Freq: Two times a day (BID) | ORAL | 1 refills | Status: DC

## 2019-12-09 MED ORDER — ZOLPIDEM TARTRATE 10 MG PO TABS: 10.0000 mg | ORAL_TABLET | Freq: Every evening | ORAL | 0 refills | Status: DC | PRN

## 2019-12-09 MED ORDER — SUCRALFATE 1 G PO TABS: ORAL_TABLET | ORAL | 1 refills | Status: DC

## 2019-12-09 MED ORDER — QUETIAPINE FUMARATE 50 MG PO TABS: 50.0000 mg | ORAL_TABLET | Freq: Two times a day (BID) | ORAL | 1 refills | Status: DC

## 2019-12-09 NOTE — Progress Notes (Signed)
[facility] [place], [place] [address]  Internal MEDICINE  Office Visit Note  Patient Name: [name]  [id number]  [id number]  Date of Service: 12/29/2019  Chief Complaint  Patient presents with  . Follow-up  . Diabetes  . Depression  . Gastroesophageal Reflux  . Hypertension  . Quality Metric Gaps    flu,eye,tetnaus  . Medication Refill    ambien,citalopram,sucralfate,ropinirole    The patient is here for routine follow up. Her blood sugars are improved since her last visit. HgbA1c is 6.9 today, down from 7.4 at her last visit. She is due to have a diabetic eye exam. Blood pressure has been well controlled. She continues to have anxiety and difficulty sleeping. She does very well taking seroquel 50mg  twice daily and does take ambien at night. She needs to have refills for both of these prescriptions today. We have tried different medications to help her with her insomnia but none have helped her sleep as well as ambien. She takes only 1/2 tablet most nights, but some nights takes a whole tablet.       Current Medication: Outpatient Encounter Medications as of 12/09/2019  Medication Sig  . Accu-Chek FastClix Lancets MISC 1 each by Does not apply route 2 (two) times daily. Use as directed twice daily diag 111.65  . alendronate (FOSAMAX) 35 MG tablet Take 1 tablet (35 mg total) by mouth every 7 (seven) days. Take with a full glass of water on an empty stomach.  Marland Kitchen aspirin 81 MG tablet Take 1 tablet by mouth daily.  Marland Kitchen atorvastatin (LIPITOR) 20 MG tablet Take 1 tablet (20 mg total) by mouth daily.  . busPIRone (BUSPAR) 10 MG tablet Take 1/2 to 1 tablet by mouth twice daily.  . citalopram (CELEXA) 40 MG tablet Take 1 tablet (40 mg total) by mouth daily.  Stasia Cavalier (EUCRISA) 2 % OINT Apply 1 application topically 2 (two) times daily as needed.  . diltiazem (CARDIZEM CD) 240 MG 24 hr capsule TAKE 1 CAPSULE(240 MG) BY MOUTH DAILY  . FLUAD 0.5 ML  SUSY ADM 0.5ML IM UTD  . Fluocinolone Acetonide 0.01 % OIL Place 5 drops in ear(s) 2 (two) times a day.  Marland Kitchen glucose blood (ACCU-CHEK GUIDE) test strip Use as directed twice a day diag e11.65  . levothyroxine (SYNTHROID) 50 MCG tablet Take 1 tablet (50 mcg total) by mouth every morning.  Marland Kitchen lisinopril (ZESTRIL) 20 MG tablet TAKE 1 TABLET(20 MG) BY MOUTH DAILY  . meloxicam (MOBIC) 15 MG tablet Take 15 mg by mouth daily.  . metFORMIN (GLUCOPHAGE) 500 MG tablet TAKE 1 TABLET(500 MG) BY MOUTH TWICE DAILY WITH A MEAL  . metoprolol tartrate (LOPRESSOR) 25 MG tablet Take 1 tablet (25 mg total) by mouth 2 (two) times daily.  . pantoprazole (PROTONIX) 40 MG tablet Take 1 tablet (40 mg total) by mouth 2 (two) times daily.  . promethazine (PHENERGAN) 25 MG tablet Take 1 tablet (25 mg total) by mouth 2 (two) times daily as needed for nausea or vomiting.  Marland Kitchen QUEtiapine (SEROQUEL) 50 MG tablet Take 1 tablet (50 mg total) by mouth 2 (two) times daily.  Marland Kitchen rOPINIRole (REQUIP) 0.25 MG tablet Take 1 tablet (0.25 mg total) by mouth 2 times daily at 12 noon and 4 pm.  . sucralfate (CARAFATE) 1 g tablet TAKE 1 TABLET BY MOUTH FOUR TIMES A DAY IF NEEDED  . tiZANidine (ZANAFLEX) 2 MG tablet Take 1 tablet (2 mg total) by mouth 2 (two)  times daily as needed for muscle spasms.  Marland Kitchen zolpidem (AMBIEN) 10 MG tablet Take 1 tablet (10 mg total) by mouth at bedtime as needed for sleep.  . [DISCONTINUED] QUEtiapine (SEROQUEL) 50 MG tablet Take 1 tablet (50 mg total) by mouth 2 (two) times daily.  . [DISCONTINUED] rOPINIRole (REQUIP) 0.25 MG tablet Take 1 tablet (0.25 mg total) by mouth 2 times daily at 12 noon and 4 pm.  . [DISCONTINUED] sucralfate (CARAFATE) 1 g tablet TAKE 1 TABLET BY MOUTH FOUR TIMES A DAY IF NEEDED  . [DISCONTINUED] sulfamethoxazole-trimethoprim (BACTRIM DS) 800-160 MG tablet Take 1 tablet by mouth 2 (two) times daily.  . [DISCONTINUED] zolpidem (AMBIEN) 10 MG tablet Take 1 tablet (10 mg total) by mouth at bedtime  as needed for sleep.   No facility-administered encounter medications on file as of 12/09/2019.    Surgical History: Past Surgical History:  Procedure Laterality Date  . ABDOMINAL HYSTERECTOMY    . BREAST EXCISIONAL BIOPSY Bilateral 80s   benign  . BREAST SURGERY    . COLONOSCOPY WITH PROPOFOL N/A 04/12/2019   Procedure: COLONOSCOPY WITH PROPOFOL;  Surgeon: Jonathon Bellows, MD;  Location: [facility] ENDOSCOPY;  Service: Gastroenterology;  Laterality: N/A;  . JOINT REPLACEMENT Right 2019    Medical History: Past Medical History:  Diagnosis Date  . Anxiety   . Depression   . Diabetes mellitus, type II ([place])   . GERD (gastroesophageal reflux disease)   . Hypertension   . Thyroid disease     Family History: Family History  Problem Relation Age of Onset  . Anxiety disorder Mother   . Colon cancer Mother   . Heart disease Father   . Anxiety disorder Father   . Obesity Sister   . Anxiety disorder Sister   . Depression Sister   . Osteoporosis Sister   . Breast cancer Paternal Aunt     Social History   Socioeconomic History  . Marital status: Widowed    Spouse name: Not on file  . Number of children: Not on file  . Years of education: Not on file  . Highest education level: Not on file  Occupational History  . Not on file  Tobacco Use  . Smoking status: Never Smoker  . Smokeless tobacco: Never Used  Vaping Use  . Vaping Use: Never used  Substance and Sexual Activity  . Alcohol use: No    Alcohol/week: 0.0 standard drinks  . Drug use: Never  . Sexual activity: Never  Other Topics Concern  . Not on file  Social History Narrative  . Not on file   Social Determinants of Health   Financial Resource Strain:   . Difficulty of Paying Living Expenses: Not on file  Food Insecurity:   . Worried About Charity fundraiser in the Last Year: Not on file  . Ran Out of Food in the Last Year: Not on file  Transportation Needs:   . Lack of Transportation (Medical): Not on file  .  Lack of Transportation (Non-Medical): Not on file  Physical Activity:   . Days of Exercise per Week: Not on file  . Minutes of Exercise per Session: Not on file  Stress:   . Feeling of Stress : Not on file  Social Connections:   . Frequency of Communication with Friends and Family: Not on file  . Frequency of Social Gatherings with Friends and Family: Not on file  . Attends Religious Services: Not on file  . Active Member of Clubs or  Organizations: Not on file  . Attends Archivist Meetings: Not on file  . Marital Status: Not on file  Intimate Partner Violence:   . Fear of Current or Ex-Partner: Not on file  . Emotionally Abused: Not on file  . Physically Abused: Not on file  . Sexually Abused: Not on file      Review of Systems  Constitutional: Negative for activity change, chills, fatigue and unexpected weight change.  HENT: Negative for congestion, postnasal drip, rhinorrhea, sneezing and sore throat.   Respiratory: Negative for cough, chest tightness, shortness of breath and wheezing.   Cardiovascular: Negative for chest pain and palpitations.       Blood sugars are improved since her last visit.   Gastrointestinal: Negative for abdominal pain, constipation, diarrhea, nausea and vomiting.       GERD symptoms present.   Endocrine: Negative for cold intolerance, heat intolerance, polydipsia and polyuria.       Blood sugars stable.  Musculoskeletal: Negative for arthralgias, back pain, joint swelling and neck pain.  Skin: Negative for rash.  Allergic/Immunologic: Negative for environmental allergies.  Neurological: Negative for dizziness, tremors, numbness and headaches.  Hematological: Negative for adenopathy. Does not bruise/bleed easily.  Psychiatric/Behavioral: Positive for dysphoric mood. Negative for behavioral problems (Depression), sleep disturbance and suicidal ideas. The patient is nervous/anxious.     Today's Vitals   12/09/19 1153  BP: 132/70  Pulse:  69  Resp: 16  Temp: (!) 97.5 F (36.4 C)  SpO2: 95%  Weight: 179 lb (81.2 kg)  Height: 5' (1.524 m)   Body mass index is 34.96 kg/m.  Physical Exam Vitals and nursing note reviewed.  Constitutional:      General: She is not in acute distress.    Appearance: Normal appearance. She is well-developed. She is not diaphoretic.  HENT:     Head: Normocephalic and atraumatic.     Nose: Nose normal.     Mouth/Throat:     Pharynx: No oropharyngeal exudate.  Eyes:     Conjunctiva/sclera: Conjunctivae normal.     Pupils: Pupils are equal, round, and reactive to light.  Neck:     Thyroid: No thyromegaly.     Vascular: No carotid bruit or JVD.     Trachea: No tracheal deviation.  Cardiovascular:     Rate and Rhythm: Normal rate and regular rhythm.     Heart sounds: Normal heart sounds. No murmur heard.  No friction rub. No gallop.   Pulmonary:     Effort: Pulmonary effort is normal. No respiratory distress.     Breath sounds: Normal breath sounds. No wheezing or rales.  Chest:     Chest wall: No tenderness.  Abdominal:     Palpations: Abdomen is soft.  Musculoskeletal:        General: Normal range of motion.     Cervical back: Normal range of motion and neck supple.  Lymphadenopathy:     Cervical: No cervical adenopathy.  Skin:    General: Skin is warm and dry.  Neurological:     Mental Status: She is alert and oriented to person, place, and time. Mental status is at baseline.     Cranial Nerves: No cranial nerve deficit.  Psychiatric:        Mood and Affect: Mood normal.        Behavior: Behavior normal.        Thought Content: Thought content normal.        Judgment: Judgment normal.  Assessment/Plan: 1. Type 2 diabetes mellitus with hyperglycemia, without long-term current use of insulin (HCC) - POCT HgB A1C 6.9 today. Continue diabetic medication as prescribed. Refer for diabetic eye exam.  - Ambulatory referral to Ophthalmology  2. Essential hypertension Stable.  Continue bp medication as prescribed   3. 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  - Ambulatory referral to Ophthalmology  4. Gastroesophageal reflux disease without esophagitis Continue protonix as prescribed and sucralfate up to four times daily as needed.  - sucralfate (CARAFATE) 1 g tablet; TAKE 1 TABLET BY MOUTH FOUR TIMES A DAY IF NEEDED  Dispense: 360 tablet; Refill: 1  5. Restless leg syndrome May take requip 0.25mg  up to twice daily as needed.  - rOPINIRole (REQUIP) 0.25 MG tablet; Take 1 tablet (0.25 mg total) by mouth 2 times daily at 12 noon and 4 pm.  Dispense: 180 tablet; Refill: 1  6. Depression, major, single episode, severe (HCC) Continue seroquel 50mg  twice daily. - QUEtiapine (SEROQUEL) 50 MG tablet; Take 1 tablet (50 mg total) by mouth 2 (two) times daily.  Dispense: 180 tablet; Refill: 1  7. Fatigue due to sleep pattern disturbance May take ambien 10mg , 1/2 to 1 tablet as needed for insomnia. New prescription sent to her pharmacy today. - zolpidem (AMBIEN) 10 MG tablet; Take 1 tablet (10 mg total) by mouth at bedtime as needed for sleep.  Dispense: 60 tablet; Refill: 0  General Counseling: Chapel verbalizes understanding of the findings of todays visit and agrees with plan of treatment. I have discussed any further diagnostic evaluation that may be needed or ordered today. We also reviewed her medications today. she has been encouraged to call the office with any questions or concerns that should arise related to todays visit.  This patient was seen by Leretha Pol FNP Collaboration with Dr Lavera Guise as a part of collaborative care agreement  Orders Placed This Encounter  Procedures  . Ambulatory referral to Ophthalmology  . POCT HgB A1C    Meds ordered this encounter  Medications  . QUEtiapine (SEROQUEL) 50 MG tablet    Sig: Take 1 tablet (50 mg total) by  mouth 2 (two) times daily.    Dispense:  180 tablet    Refill:  1    Order Specific Question:   Supervising Provider    Answer:   Lavera Guise [[id number]]  . zolpidem (AMBIEN) 10 MG tablet    Sig: Take 1 tablet (10 mg total) by mouth at bedtime as needed for sleep.    Dispense:  60 tablet    Refill:  0    Patient is going to [place] for about a month, please fill 60 days this one time for coverage. In place of the 09/11/2019 Refill that is on file.    Order Specific Question:   Supervising Provider    Answer:   Lavera Guise [[id number]]  . sucralfate (CARAFATE) 1 g tablet    Sig: TAKE 1 TABLET BY MOUTH FOUR TIMES A DAY IF NEEDED    Dispense:  360 tablet    Refill:  1    Order Specific Question:   Supervising Provider    Answer:   Lavera Guise [[id number]]  . rOPINIRole (REQUIP) 0.25 MG tablet    Sig: Take 1 tablet (0.25 mg total) by mouth 2 times daily at 12 noon and 4 pm.    Dispense:  180 tablet    Refill:  1    Order Specific Question:   Supervising Provider    Answer:   Lavera Guise [[id number]]    Total time spent: 30 Minutes   Time spent includes review of chart, medications, test results, and follow up plan with the patient.      Dr Lavera Guise Internal medicine

## 2019-12-27 ENCOUNTER — Other Ambulatory Visit: Payer: Self-pay | Admitting: Nurse Practitioner

## 2019-12-28 LAB — COMPREHENSIVE METABOLIC PANEL
ALT: 30 IU/L (ref 0–32)
AST: 21 IU/L (ref 0–40)
Albumin/Globulin Ratio: 2.6 — ABNORMAL HIGH (ref 1.2–2.2)
Albumin: 4.5 g/dL (ref 3.7–4.7)
Alkaline Phosphatase: 63 IU/L (ref 44–121)
BUN/Creatinine Ratio: 10 — ABNORMAL LOW (ref 12–28)
BUN: 8 mg/dL (ref 8–27)
Bilirubin Total: 0.5 mg/dL (ref 0.0–1.2)
CO2: 21 mmol/L (ref 20–29)
Calcium: 10.3 mg/dL (ref 8.7–10.3)
Chloride: 100 mmol/L (ref 96–106)
Creatinine, Ser: 0.77 mg/dL (ref 0.57–1.00)
GFR calc Af Amer: 87 mL/min/{1.73_m2} (ref >59)
GFR calc non Af Amer: 76 mL/min/{1.73_m2} (ref >59)
Globulin, Total: 1.7 g/dL (ref 1.5–4.5)
Glucose: 163 mg/dL — ABNORMAL HIGH (ref 65–99)
Potassium: 4.7 mmol/L (ref 3.5–5.2)
Sodium: 137 mmol/L (ref 134–144)
Total Protein: 6.2 g/dL (ref 6.0–8.5)

## 2019-12-28 LAB — LIPID PANEL WITH LDL/HDL RATIO
Cholesterol, Total: 121 mg/dL (ref 100–199)
HDL: 42 mg/dL (ref >39)
LDL Chol Calc (NIH): 58 mg/dL (ref 0–99)
LDL/HDL Ratio: 1.4 ratio (ref 0.0–3.2)
Triglycerides: 115 mg/dL (ref 0–149)
VLDL Cholesterol Cal: 21 mg/dL (ref 5–40)

## 2019-12-28 LAB — CBC
Hematocrit: 38.7 % (ref 34.0–46.6)
Hemoglobin: 12.7 g/dL (ref 11.1–15.9)
MCH: 29.8 pg (ref 26.6–33.0)
MCHC: 32.8 g/dL (ref 31.5–35.7)
MCV: 91 fL (ref 79–97)
Platelets: 269 10*3/uL (ref 150–450)
RBC: 4.26 x10E6/uL (ref 3.77–5.28)
RDW: 12.3 % (ref 11.7–15.4)
WBC: 12 10*3/uL — ABNORMAL HIGH (ref 3.4–10.8)

## 2019-12-28 LAB — IRON AND TIBC
Iron Saturation: 23 % (ref 15–55)
Iron: 73 ug/dL (ref 27–139)
Total Iron Binding Capacity: 315 ug/dL (ref 250–450)
UIBC: 242 ug/dL (ref 118–369)

## 2019-12-28 LAB — B12 AND FOLATE PANEL
Folate: >20 ng/mL (ref >3.0)
Vitamin B-12: 456 pg/mL (ref 232–1245)

## 2019-12-28 LAB — FERRITIN: Ferritin: 23 ng/mL (ref 15–150)

## 2019-12-28 LAB — TSH: TSH: 0.918 u[IU]/mL (ref 0.450–4.500)

## 2019-12-28 LAB — T4, FREE: Free T4: 1.28 ng/dL (ref 0.82–1.77)

## 2019-12-28 LAB — VITAMIN D 25 HYDROXY (VIT D DEFICIENCY, FRACTURES): Vit D, 25-Hydroxy: 48.1 ng/mL (ref 30.0–100.0)

## 2019-12-29 DIAGNOSIS — H259 Unspecified age-related cataract: Secondary | ICD-10-CM | POA: Insufficient documentation

## 2020-01-04 NOTE — Progress Notes (Signed)
Mild elevation of WBC. Discuss with patient 11/29

## 2020-01-06 ENCOUNTER — Other Ambulatory Visit: Payer: Self-pay

## 2020-01-06 ENCOUNTER — Ambulatory Visit (INDEPENDENT_AMBULATORY_CARE_PROVIDER_SITE_OTHER): Payer: Medicare Other | Admitting: Nurse Practitioner

## 2020-01-06 ENCOUNTER — Encounter: Payer: Self-pay | Admitting: Nurse Practitioner

## 2020-01-06 VITALS — BP 129/79 | HR 63 | Temp 97.7°F | Resp 16 | Ht 60.0 in | Wt 181.6 lb

## 2020-01-06 DIAGNOSIS — I10 Essential (primary) hypertension: Secondary | ICD-10-CM

## 2020-01-06 DIAGNOSIS — F322 Major depressive disorder, single episode, severe without psychotic features: Secondary | ICD-10-CM

## 2020-01-06 DIAGNOSIS — Z23 Encounter for immunization: Secondary | ICD-10-CM

## 2020-01-06 DIAGNOSIS — K219 Gastro-esophageal reflux disease without esophagitis: Secondary | ICD-10-CM

## 2020-01-06 DIAGNOSIS — E1165 Type 2 diabetes mellitus with hyperglycemia: Secondary | ICD-10-CM

## 2020-01-06 DIAGNOSIS — Z0001 Encounter for general adult medical examination with abnormal findings: Secondary | ICD-10-CM

## 2020-01-06 DIAGNOSIS — M810 Age-related osteoporosis without current pathological fracture: Secondary | ICD-10-CM

## 2020-01-06 MED ORDER — ZOSTER VAC RECOMB ADJUVANTED 50 MCG/0.5ML IM SUSR: INTRAMUSCULAR | 1 refills | Status: DC

## 2020-01-06 NOTE — Progress Notes (Signed)
[facility] [place],  [address]  Internal MEDICINE  Office Visit Note  Patient Name: [name]  [id number]  [id number]  Date of Service: 01/26/2020   Pt is here for routine health maintenance examination  Chief Complaint  Patient presents with  . Medicare Wellness  . Depression  . Diabetes  . Gastroesophageal Reflux  . Hypertension  . Quality Metric Gaps    eye exam  . controlled substance form    reviewed with PT     The patient is here for health maintenance exam. She states that she is having trouble with her vision. She has developed cataracts and it is getting harder for her to see. She has appointment with eye doctor at end of march. She states that she is otherwise doing well. Had labs done prior to this visit. Her WBC count was mildly elevated at 12.0 but all other labs were normal. She has no new concerns or complaints today. Would like to get her shingles vaccine series. She states that to wait to get this until her new medicare cared comes in.     Current Medication: Outpatient Encounter Medications as of 01/06/2020  Medication Sig  . Accu-Chek FastClix Lancets MISC 1 each by Does not apply route 2 (two) times daily. Use as directed twice daily diag 111.65  . alendronate (FOSAMAX) 35 MG tablet Take 1 tablet (35 mg total) by mouth every 7 (seven) days. Take with a full glass of water on an empty stomach.  Marland Kitchen aspirin 81 MG tablet Take 1 tablet by mouth daily.  Marland Kitchen atorvastatin (LIPITOR) 20 MG tablet Take 1 tablet (20 mg total) by mouth daily.  . citalopram (CELEXA) 40 MG tablet Take 1 tablet (40 mg total) by mouth daily.  Stasia Cavalier (EUCRISA) 2 % OINT Apply 1 application topically 2 (two) times daily as needed.  . diltiazem (CARDIZEM CD) 240 MG 24 hr capsule TAKE 1 CAPSULE(240 MG) BY MOUTH DAILY  . FLUAD 0.5 ML SUSY ADM 0.5ML IM UTD  . Fluocinolone Acetonide 0.01 % OIL Place 5 drops in ear(s) 2 (two) times a day.  Marland Kitchen glucose  blood (ACCU-CHEK GUIDE) test strip Use as directed twice a day diag e11.65  . levothyroxine (SYNTHROID) 50 MCG tablet Take 1 tablet (50 mcg total) by mouth every morning.  Marland Kitchen lisinopril (ZESTRIL) 20 MG tablet TAKE 1 TABLET(20 MG) BY MOUTH DAILY  . meloxicam (MOBIC) 15 MG tablet Take 15 mg by mouth daily.  . metFORMIN (GLUCOPHAGE) 500 MG tablet TAKE 1 TABLET(500 MG) BY MOUTH TWICE DAILY WITH A MEAL  . metoprolol tartrate (LOPRESSOR) 25 MG tablet Take 1 tablet (25 mg total) by mouth 2 (two) times daily.  . pantoprazole (PROTONIX) 40 MG tablet Take 1 tablet (40 mg total) by mouth 2 (two) times daily.  . promethazine (PHENERGAN) 25 MG tablet Take 1 tablet (25 mg total) by mouth 2 (two) times daily as needed for nausea or vomiting.  Marland Kitchen QUEtiapine (SEROQUEL) 50 MG tablet Take 1 tablet (50 mg total) by mouth 2 (two) times daily.  Marland Kitchen rOPINIRole (REQUIP) 0.25 MG tablet Take 1 tablet (0.25 mg total) by mouth 2 times daily at 12 noon and 4 pm.  . sucralfate (CARAFATE) 1 g tablet TAKE 1 TABLET BY MOUTH FOUR TIMES A DAY IF NEEDED  . tiZANidine (ZANAFLEX) 2 MG tablet Take 1 tablet (2 mg total) by mouth 2 (two) times daily as needed for muscle spasms.  Marland Kitchen zolpidem (AMBIEN) 10 MG  tablet Take 1 tablet (10 mg total) by mouth at bedtime as needed for sleep.  . [DISCONTINUED] busPIRone (BUSPAR) 10 MG tablet Take 1/2 to 1 tablet by mouth twice daily.  Marland Kitchen Zoster Vaccine Adjuvanted [facility]) injection Shingles vaccine series - inject IM as directed .   No facility-administered encounter medications on file as of 01/06/2020.    Surgical History: Past Surgical History:  Procedure Laterality Date  . ABDOMINAL HYSTERECTOMY    . BREAST EXCISIONAL BIOPSY Bilateral 80s   benign  . BREAST SURGERY    . COLONOSCOPY WITH PROPOFOL N/A 04/12/2019   Procedure: COLONOSCOPY WITH PROPOFOL;  Surgeon: Jonathon Bellows, MD;  Location: [facility] ENDOSCOPY;  Service: Gastroenterology;  Laterality: N/A;  . JOINT REPLACEMENT Right 2019    Medical  History: Past Medical History:  Diagnosis Date  . Anxiety   . Depression   . Diabetes mellitus, type II ([place])   . GERD (gastroesophageal reflux disease)   . Hypertension   . Thyroid disease     Family History: Family History  Problem Relation Age of Onset  . Anxiety disorder Mother   . Colon cancer Mother   . Heart disease Father   . Anxiety disorder Father   . Obesity Sister   . Anxiety disorder Sister   . Depression Sister   . Osteoporosis Sister   . Breast cancer Paternal Aunt       Review of Systems  Constitutional: Negative for activity change, chills, fatigue and unexpected weight change.  HENT: Negative for congestion, postnasal drip, rhinorrhea, sneezing and sore throat.   Respiratory: Negative for cough, chest tightness, shortness of breath and wheezing.   Cardiovascular: Negative for chest pain and palpitations.  Gastrointestinal: Negative for abdominal pain, constipation, diarrhea, nausea and vomiting.       GERD symptoms present.   Endocrine: Negative for cold intolerance, heat intolerance, polydipsia and polyuria.       Blood sugars stable.  Genitourinary: Negative for dysuria, frequency and urgency.  Musculoskeletal: Negative for arthralgias, back pain, joint swelling and neck pain.  Skin: Negative for rash.  Allergic/Immunologic: Negative for environmental allergies.  Neurological: Negative for dizziness, tremors, numbness and headaches.  Hematological: Negative for adenopathy. Does not bruise/bleed easily.  Psychiatric/Behavioral: Positive for dysphoric mood. Negative for behavioral problems (Depression), sleep disturbance and suicidal ideas. The patient is nervous/anxious.      Today's Vitals   01/06/20 1352  BP: 129/79  Pulse: 63  Resp: 16  Temp: 97.7 F (36.5 C)  SpO2: 97%  Weight: 181 lb 9.6 oz (82.4 kg)  Height: 5' (1.524 m)   Body mass index is 35.47 kg/m.  Physical Exam Vitals and nursing note reviewed.  Constitutional:       General: She is not in acute distress.    Appearance: Normal appearance. She is well-developed. She is not diaphoretic.  HENT:     Head: Normocephalic and atraumatic.     Nose: Nose normal.     Mouth/Throat:     Pharynx: No oropharyngeal exudate.  Eyes:     Conjunctiva/sclera: Conjunctivae normal.     Pupils: Pupils are equal, round, and reactive to light.  Neck:     Thyroid: No thyromegaly.     Vascular: No carotid bruit or JVD.     Trachea: No tracheal deviation.  Cardiovascular:     Rate and Rhythm: Normal rate and regular rhythm.     Pulses:          Dorsalis pedis pulses are 1+ on the  right side and 1+ on the left side.       Posterior tibial pulses are 1+ on the right side and 1+ on the left side.     Heart sounds: Normal heart sounds. No murmur heard. No friction rub. No gallop.   Pulmonary:     Effort: Pulmonary effort is normal. No respiratory distress.     Breath sounds: Normal breath sounds. No wheezing or rales.  Chest:     Chest wall: No tenderness.  Abdominal:     Palpations: Abdomen is soft.  Musculoskeletal:        General: Normal range of motion.     Cervical back: Normal range of motion and neck supple.     Right foot: Normal range of motion. No deformity or bunion.     Left foot: Normal range of motion. No deformity or bunion.  Feet:     Right foot:     Protective Sensation: 10 sites tested. 10 sites sensed.     Skin integrity: Skin integrity normal.     Toenail Condition: Right toenails are normal.     Left foot:     Protective Sensation: 10 sites tested. 10 sites sensed.     Skin integrity: Skin integrity normal.     Toenail Condition: Left toenails are normal.  Lymphadenopathy:     Cervical: No cervical adenopathy.  Skin:    General: Skin is warm and dry.  Neurological:     Mental Status: She is alert and oriented to person, place, and time. Mental status is at baseline.     Cranial Nerves: No cranial nerve deficit.  Psychiatric:        Mood and  Affect: Mood normal.        Behavior: Behavior normal.        Thought Content: Thought content normal.        Judgment: Judgment normal.    Depression screen [facility] 2/9 01/06/2020 12/09/2019 09/04/2019 07/12/2019 03/25/2019  Decreased Interest 0 0 0 0 0  Down, Depressed, Hopeless 0 0 0 0 0  PHQ - 2 Score 0 0 0 0 0    Functional Status Survey: Is the patient deaf or have difficulty hearing?: No Does the patient have difficulty seeing, even when wearing glasses/contacts?: Yes Does the patient have difficulty concentrating, remembering, or making decisions?: No Does the patient have difficulty walking or climbing stairs?: No Does the patient have difficulty dressing or bathing?: No Does the patient have difficulty doing errands alone such as visiting a doctor's office or shopping?: Yes  MMSE - [place] Exam 01/06/2020 12/28/2017  Orientation to time 5 5  Orientation to Place 5 5  Registration 3 3  Attention/ Calculation 5 5  Recall 3 3  Language- name 2 objects 2 2  Language- repeat 1 1  Language- follow 3 step command 3 3  Language- read & follow direction 1 1  Write a sentence 1 1  Copy design 1 0  Total score 30 29    Fall Risk  01/06/2020 12/09/2019 09/04/2019 07/12/2019 03/25/2019  Falls in the past year? 0 0 0 0 0  Number falls in past yr: - - - - -  Injury with Fall? - - - - -      LABS: Recent Results (from the past 2160 hour(s))  POCT HgB A1C     Status: Abnormal   Collection Time: 12/09/19 12:07 PM  Result Value Ref Range   Hemoglobin A1C 6.9 (A) 4.0 -  5.6 %   HbA1c POC (<> result, manual entry)     HbA1c, POC (prediabetic range)     HbA1c, POC (controlled diabetic range)    Comprehensive metabolic panel     Status: Abnormal   Collection Time: 12/27/19  9:40 AM  Result Value Ref Range   Glucose 163 (H) 65 - 99 mg/dL   BUN 8 8 - 27 mg/dL   Creatinine, Ser 0.77 0.57 - 1.00 mg/dL   GFR calc non Af Amer 76 >59 mL/min/1.73   GFR calc Af Amer 87 >59 mL/min/1.73     Comment: **In accordance with recommendations from the NKF-ASN Task force,**   [facility] is in the process of updating its eGFR calculation to the   2021 CKD-EPI creatinine equation that estimates kidney function   without a race variable.    BUN/Creatinine Ratio 10 (L) 12 - 28   Sodium 137 134 - 144 mmol/L   Potassium 4.7 3.5 - 5.2 mmol/L   Chloride 100 96 - 106 mmol/L   CO2 21 20 - 29 mmol/L   Calcium 10.3 8.7 - 10.3 mg/dL   Total Protein 6.2 6.0 - 8.5 g/dL   Albumin 4.5 3.7 - 4.7 g/dL   Globulin, Total 1.7 1.5 - 4.5 g/dL   Albumin/Globulin Ratio 2.6 (H) 1.2 - 2.2   Bilirubin Total 0.5 0.0 - 1.2 mg/dL   Alkaline Phosphatase 63 44 - 121 IU/L    Comment:               **Please note reference interval change**   AST 21 0 - 40 IU/L   ALT 30 0 - 32 IU/L  CBC     Status: Abnormal   Collection Time: 12/27/19  9:40 AM  Result Value Ref Range   WBC 12.0 (H) 3.4 - 10.8 x10E3/uL   RBC 4.26 3.77 - 5.28 x10E6/uL   Hemoglobin 12.7 11.1 - 15.9 g/dL   Hematocrit 38.7 34.0 - 46.6 %   MCV 91 79 - 97 fL   MCH 29.8 26.6 - 33.0 pg   MCHC 32.8 31.5 - 35.7 g/dL   RDW 12.3 11.7 - 15.4 %   Platelets 269 150 - 450 x10E3/uL  Lipid Panel With LDL/HDL Ratio     Status: None   Collection Time: 12/27/19  9:40 AM  Result Value Ref Range   Cholesterol, Total 121 100 - 199 mg/dL   Triglycerides 115 0 - 149 mg/dL   HDL 42 >39 mg/dL   VLDL Cholesterol Cal 21 5 - 40 mg/dL   LDL Chol Calc (NIH) 58 0 - 99 mg/dL   LDL/HDL Ratio 1.4 0.0 - 3.2 ratio    Comment:                                     LDL/HDL Ratio                                             Men  Women                               1/2 Avg.Risk  1.0    1.5  Avg.Risk  3.6    3.2                                2X Avg.Risk  6.2    5.0                                3X Avg.Risk  8.0    6.1   Iron and TIBC     Status: None   Collection Time: 12/27/19  9:40 AM  Result Value Ref Range   Total Iron Binding Capacity  315 250 - 450 ug/dL   UIBC 242 118 - 369 ug/dL   Iron 73 27 - 139 ug/dL   Iron Saturation 23 15 - 55 %  B12 and Folate Panel     Status: None   Collection Time: 12/27/19  9:40 AM  Result Value Ref Range   Vitamin B-12 456 232 - 1,245 pg/mL   Folate >20.0 >3.0 ng/mL    Comment: A serum folate concentration of less than 3.1 ng/mL is considered to represent clinical deficiency.   T4, free     Status: None   Collection Time: 12/27/19  9:40 AM  Result Value Ref Range   Free T4 1.28 0.82 - 1.77 ng/dL  TSH     Status: None   Collection Time: 12/27/19  9:40 AM  Result Value Ref Range   TSH 0.918 0.450 - 4.500 uIU/mL  VITAMIN D 25 Hydroxy (Vit-D Deficiency, Fractures)     Status: None   Collection Time: 12/27/19  9:40 AM  Result Value Ref Range   Vit D, 25-Hydroxy 48.1 30.0 - 100.0 ng/mL    Comment: Vitamin D deficiency has been defined by the [place] practice guideline as a level of serum 25-OH vitamin D less than 20 ng/mL (1,2). The Endocrine Society went on to further define vitamin D insufficiency as a level between 21 and 29 ng/mL (2). 1. IOM (Institute of Medicine). 2010. Dietary reference    intakes for calcium and D. [place]: The    [employer]. 2. Holick MF, Binkley , Bischoff-Ferrari HA, et al.    Evaluation, treatment, and prevention of vitamin D    deficiency: an Endocrine Society clinical practice    guideline. JCEM. 2011 Jul; 96(7):1911-30.   Ferritin     Status: None   Collection Time: 12/27/19  9:40 AM  Result Value Ref Range   Ferritin 23 15 - 150 ng/mL   Assessment/Plan: 1. Encounter for general adult medical examination with abnormal findings Annual health maintenance exam today.   2. Type 2 diabetes mellitus with hyperglycemia, without long-term current use of insulin (HCC) Blood sugars are stable. Continue medications as previously prescribed .  3. Essential hypertension Stable. contineu bp  medication as prescribed   4. Gastroesophageal reflux disease without esophagitis Continue protonix and sucralfate as previously prescribed   5. Depression, major, single episode, severe ([place]) Well managed. Continue citalopram and seroquel as prescribed. May take zolpidem as needed and as prescribed.   6. Age-related osteoporosis without current pathological fracture continue fosamax as prescribed   7. Need for shingles vaccine Prescription for shingrix vaccine series sent to her pharmacy for administration.  - Zoster Vaccine Adjuvanted [facility]) injection; Shingles vaccine series - inject IM as directed .  Dispense: 0.5 mL; Refill: 1  General Counseling: Annikah verbalizes  understanding of the findings of todays visit and agrees with plan of treatment. I have discussed any further diagnostic evaluation that may be needed or ordered today. We also reviewed her medications today. she has been encouraged to call the office with any questions or concerns that should arise related to todays visit.    Counseling:  This patient was seen by [address] with Dr Lavera Guise as a part of collaborative care agreement  Meds ordered this encounter  Medications  . Zoster Vaccine Adjuvanted [facility]) injection    Sig: Shingles vaccine series - inject IM as directed .    Dispense:  0.5 mL    Refill:  1    Order Specific Question:   Supervising Provider    Answer:   Lavera Guise [[id number]]    Total time spent: 47 Minutes  Time spent includes review of chart, medications, test results, and follow up plan with the patient.     Lavera Guise, MD  Internal Medicine

## 2020-01-20 ENCOUNTER — Other Ambulatory Visit: Payer: Self-pay

## 2020-01-20 MED ORDER — BUSPIRONE HCL 10 MG PO TABS
ORAL_TABLET | ORAL | 1 refills | Status: DC
Start: 2020-01-20 — End: 2020-05-25

## 2020-01-26 DIAGNOSIS — Z23 Encounter for immunization: Secondary | ICD-10-CM | POA: Insufficient documentation

## 2020-02-03 ENCOUNTER — Other Ambulatory Visit: Payer: Self-pay | Admitting: Nurse Practitioner

## 2020-02-03 ENCOUNTER — Other Ambulatory Visit: Payer: Self-pay

## 2020-02-03 ENCOUNTER — Telehealth: Payer: Self-pay

## 2020-02-03 DIAGNOSIS — I10 Essential (primary) hypertension: Secondary | ICD-10-CM

## 2020-02-03 DIAGNOSIS — G2581 Restless legs syndrome: Secondary | ICD-10-CM

## 2020-02-03 DIAGNOSIS — K219 Gastro-esophageal reflux disease without esophagitis: Secondary | ICD-10-CM

## 2020-02-03 DIAGNOSIS — G479 Sleep disorder, unspecified: Secondary | ICD-10-CM

## 2020-02-03 MED ORDER — DILTIAZEM HCL ER COATED BEADS 240 MG PO CP24: ORAL_CAPSULE | ORAL | 1 refills | Status: DC

## 2020-02-03 MED ORDER — ZOLPIDEM TARTRATE 10 MG PO TABS: 10.0000 mg | ORAL_TABLET | Freq: Every evening | ORAL | 2 refills | Status: DC | PRN

## 2020-02-03 MED ORDER — PANTOPRAZOLE SODIUM 40 MG PO TBEC: 40.0000 mg | DELAYED_RELEASE_TABLET | Freq: Two times a day (BID) | ORAL | 1 refills | Status: DC

## 2020-02-03 MED ORDER — ROPINIROLE HCL 0.25 MG PO TABS: 0.2500 mg | ORAL_TABLET | Freq: Two times a day (BID) | ORAL | 1 refills | Status: DC

## 2020-02-03 MED ORDER — METFORMIN HCL 500 MG PO TABS
ORAL_TABLET | ORAL | 3 refills | Status: DC
Start: 2020-02-03 — End: 2020-04-07

## 2020-02-03 MED ORDER — CITALOPRAM HYDROBROMIDE 40 MG PO TABS: 40.0000 mg | ORAL_TABLET | Freq: Every day | ORAL | 1 refills | Status: DC

## 2020-02-03 MED ORDER — SUCRALFATE 1 G PO TABS: ORAL_TABLET | ORAL | 1 refills | Status: DC

## 2020-02-03 NOTE — Telephone Encounter (Signed)
I did this and sent to [facility] [address].

## 2020-02-25 ENCOUNTER — Other Ambulatory Visit: Payer: Self-pay

## 2020-02-25 DIAGNOSIS — M810 Age-related osteoporosis without current pathological fracture: Secondary | ICD-10-CM

## 2020-02-25 MED ORDER — ALENDRONATE SODIUM 35 MG PO TABS: 35.0000 mg | ORAL_TABLET | ORAL | 5 refills | Status: DC

## 2020-02-26 ENCOUNTER — Other Ambulatory Visit: Payer: Self-pay

## 2020-02-26 DIAGNOSIS — M5440 Lumbago with sciatica, unspecified side: Secondary | ICD-10-CM

## 2020-02-26 MED ORDER — TIZANIDINE HCL 2 MG PO TABS: 2.0000 mg | ORAL_TABLET | Freq: Two times a day (BID) | ORAL | 1 refills | Status: DC | PRN

## 2020-03-09 ENCOUNTER — Other Ambulatory Visit: Payer: Self-pay

## 2020-03-09 DIAGNOSIS — I10 Essential (primary) hypertension: Secondary | ICD-10-CM

## 2020-03-09 MED ORDER — METOPROLOL TARTRATE 25 MG PO TABS: 25.0000 mg | ORAL_TABLET | Freq: Two times a day (BID) | ORAL | 1 refills | Status: DC

## 2020-03-09 MED ORDER — ATORVASTATIN CALCIUM 20 MG PO TABS
20.0000 mg | ORAL_TABLET | Freq: Every day | ORAL | 1 refills | Status: DC
Start: 2020-03-09 — End: 2020-05-25

## 2020-04-06 ENCOUNTER — Ambulatory Visit: Payer: Medicare Other | Admitting: Hospice and Palliative Medicine

## 2020-04-07 ENCOUNTER — Telehealth: Payer: Self-pay

## 2020-04-07 ENCOUNTER — Ambulatory Visit: Payer: TRICARE For Life (TFL) | Admitting: Internal Medicine

## 2020-04-07 ENCOUNTER — Encounter: Payer: Self-pay | Admitting: Internal Medicine

## 2020-04-07 ENCOUNTER — Ambulatory Visit (INDEPENDENT_AMBULATORY_CARE_PROVIDER_SITE_OTHER): Payer: Medicare Other | Admitting: Internal Medicine

## 2020-04-07 VITALS — BP 138/78 | HR 69 | Temp 98.2°F | Resp 16 | Ht 60.0 in | Wt 182.6 lb

## 2020-04-07 DIAGNOSIS — F322 Major depressive disorder, single episode, severe without psychotic features: Secondary | ICD-10-CM

## 2020-04-07 DIAGNOSIS — G4709 Other insomnia: Secondary | ICD-10-CM

## 2020-04-07 DIAGNOSIS — M810 Age-related osteoporosis without current pathological fracture: Secondary | ICD-10-CM | POA: Diagnosis not present

## 2020-04-07 DIAGNOSIS — E1165 Type 2 diabetes mellitus with hyperglycemia: Secondary | ICD-10-CM | POA: Diagnosis not present

## 2020-04-07 DIAGNOSIS — G479 Sleep disorder, unspecified: Secondary | ICD-10-CM

## 2020-04-07 LAB — POCT GLYCOSYLATED HEMOGLOBIN (HGB A1C): Hemoglobin A1C: 7.1 % — AB (ref 4.0–5.6)

## 2020-04-07 MED ORDER — CANAGLIFLOZIN 100 MG PO TABS: ORAL_TABLET | ORAL | 3 refills | Status: DC

## 2020-04-07 MED ORDER — ZOLPIDEM TARTRATE 10 MG PO TABS: 10.0000 mg | ORAL_TABLET | Freq: Every evening | ORAL | 0 refills | Status: DC | PRN

## 2020-04-07 NOTE — Progress Notes (Signed)
[facility] [place], [place] [address]  Internal MEDICINE  Office Visit Note  Patient Name: [name]  [id number]  [id number]  Date of Service: 04/07/2020  Chief Complaint  Patient presents with  . Medication Refill    ambien    HPI  Pt is  Here for routine follow up. She has multiple medical problems. Has fairly controlled DM and HTN takes all meds as prescribed, significant insomnia with depression, Hg A1c is slightly worse than last visit. Denies any major complaints, she is not physically active, daughter in the room with her   Pt does have h/o acute ischemic colitis in 2018 and was treated during inpt admission  She also has aortic atherosclerosis and in Lipitor    Current Medication: Outpatient Encounter Medications as of 04/07/2020  Medication Sig  . Accu-Chek FastClix Lancets MISC 1 each by Does not apply route 2 (two) times daily. Use as directed twice daily diag 111.65  . alendronate (FOSAMAX) 35 MG tablet Take 1 tablet (35 mg total) by mouth every 7 (seven) days. Take with a full glass of water on an empty stomach.  Marland Kitchen aspirin 81 MG tablet Take 1 tablet by mouth daily.  Marland Kitchen atorvastatin (LIPITOR) 20 MG tablet Take 1 tablet (20 mg total) by mouth daily.  . busPIRone (BUSPAR) 10 MG tablet Take 1/2 to 1 tablet by mouth twice daily.  . citalopram (CELEXA) 40 MG tablet Take 1 tablet (40 mg total) by mouth daily.  Stasia Cavalier (EUCRISA) 2 % OINT Apply 1 application topically 2 (two) times daily as needed.  . diltiazem (CARDIZEM CD) 240 MG 24 hr capsule TAKE 1 CAPSULE(240 MG) BY MOUTH DAILY  . FLUAD 0.5 ML SUSY ADM 0.5ML IM UTD  . Fluocinolone Acetonide 0.01 % OIL Place 5 drops in ear(s) 2 (two) times a day.  Marland Kitchen glucose blood (ACCU-CHEK GUIDE) test strip Use as directed twice a day diag e11.65  . levothyroxine (SYNTHROID) 50 MCG tablet Take 1 tablet (50 mcg total) by mouth every morning.  Marland Kitchen lisinopril (ZESTRIL) 20 MG tablet TAKE 1 TABLET(20 MG) BY  MOUTH DAILY  . meloxicam (MOBIC) 15 MG tablet Take 15 mg by mouth daily.  . metFORMIN (GLUCOPHAGE) 500 MG tablet TAKE 1 TABLET(500 MG) BY MOUTH TWICE DAILY WITH A MEAL  . metoprolol tartrate (LOPRESSOR) 25 MG tablet Take 1 tablet (25 mg total) by mouth 2 (two) times daily.  . pantoprazole (PROTONIX) 40 MG tablet Take 1 tablet (40 mg total) by mouth 2 (two) times daily.  . promethazine (PHENERGAN) 25 MG tablet Take 1 tablet (25 mg total) by mouth 2 (two) times daily as needed for nausea or vomiting.  Marland Kitchen QUEtiapine (SEROQUEL) 50 MG tablet Take 1 tablet (50 mg total) by mouth 2 (two) times daily.  Marland Kitchen rOPINIRole (REQUIP) 0.25 MG tablet Take 1 tablet (0.25 mg total) by mouth 2 times daily at 12 noon and 4 pm.  . sucralfate (CARAFATE) 1 g tablet TAKE 1 TABLET BY MOUTH FOUR TIMES A DAY IF NEEDED  . tiZANidine (ZANAFLEX) 2 MG tablet Take 1 tablet (2 mg total) by mouth 2 (two) times daily as needed for muscle spasms.  Marland Kitchen zolpidem (AMBIEN) 10 MG tablet Take 1 tablet (10 mg total) by mouth at bedtime as needed for sleep.  Marland Kitchen Zoster Vaccine Adjuvanted [facility]) injection Shingles vaccine series - inject IM as directed .   No facility-administered encounter medications on file as of 04/07/2020.    Surgical History: Past  Surgical History:  Procedure Laterality Date  . ABDOMINAL HYSTERECTOMY    . BREAST EXCISIONAL BIOPSY Bilateral 80s   benign  . BREAST SURGERY    . COLONOSCOPY WITH PROPOFOL N/A 04/12/2019   Procedure: COLONOSCOPY WITH PROPOFOL;  Surgeon: Jonathon Bellows, MD;  Location[facility] ENDOSCOPY;  Service: Gastroenterology;  Laterality: N/A;  . JOINT REPLACEMENT Right 2019    Medical History: Past Medical History:  Diagnosis Date  . Anxiety   . Depression   . Diabetes mellitus, type II ([place])   . GERD (gastroesophageal reflux disease)   . Hypertension   . Thyroid disease     Family History: Family History  Problem Relation Age of Onset  . Anxiety disorder Mother   . Colon cancer Mother   .  Heart disease Father   . Anxiety disorder Father   . Obesity Sister   . Anxiety disorder Sister   . Depression Sister   . Osteoporosis Sister   . Breast cancer Paternal Aunt     Social History   Socioeconomic History  . Marital status: Widowed    Spouse name: Not on file  . Number of children: Not on file  . Years of education: Not on file  . Highest education level: Not on file  Occupational History  . Not on file  Tobacco Use  . Smoking status: Never Smoker  . Smokeless tobacco: Never Used  Vaping Use  . Vaping Use: Never used  Substance and Sexual Activity  . Alcohol use: No    Alcohol/week: 0.0 standard drinks  . Drug use: Never  . Sexual activity: Never  Other Topics Concern  . Not on file  Social History Narrative  . Not on file   Social Determinants of Health   Financial Resource Strain: Not on file  Food Insecurity: Not on file  Transportation Needs: Not on file  Physical Activity: Not on file  Stress: Not on file  Social Connections: Not on file  Intimate Partner Violence: Not on file      Review of Systems  Constitutional: Negative for chills, diaphoresis and fatigue.  HENT: Negative for ear pain, postnasal drip and sinus pressure.   Eyes: Negative for photophobia, discharge, redness, itching and visual disturbance.  Respiratory: Negative for cough, shortness of breath and wheezing.   Cardiovascular: Negative for chest pain, palpitations and leg swelling.  Gastrointestinal: Negative for abdominal pain, constipation, diarrhea, nausea and vomiting.  Genitourinary: Negative for dysuria and flank pain.  Musculoskeletal: Negative for arthralgias, back pain, gait problem and neck pain.  Skin: Negative for color change.  Allergic/Immunologic: Negative for environmental allergies and food allergies.  Neurological: Negative for dizziness and headaches.  Hematological: Does not bruise/bleed easily.  Psychiatric/Behavioral: Negative for agitation, behavioral  problems (depression) and hallucinations.    Vital Signs: BP (!) 174/76   Pulse 69   Temp 98.2 F (36.8 C)   Resp 16   Ht 5' (1.524 m)   Wt 182 lb 9.6 oz (82.8 kg)   SpO2 95%   BMI 35.66 kg/m    Physical Exam Constitutional:      General: She is not in acute distress.    Appearance: She is well-developed. She is not diaphoretic.  HENT:     Head: Normocephalic and atraumatic.     Mouth/Throat:     Pharynx: No oropharyngeal exudate.  Eyes:     Pupils: Pupils are equal, round, and reactive to light.  Neck:     Thyroid: No thyromegaly.  Vascular: No JVD.     Trachea: No tracheal deviation.  Cardiovascular:     Rate and Rhythm: Normal rate and regular rhythm.     Heart sounds: Normal heart sounds. No murmur heard. No friction rub. No gallop.   Pulmonary:     Effort: Pulmonary effort is normal. No respiratory distress.     Breath sounds: No wheezing or rales.  Chest:     Chest wall: No tenderness.  Abdominal:     General: Bowel sounds are normal.     Palpations: Abdomen is soft.  Musculoskeletal:        General: Normal range of motion.     Cervical back: Normal range of motion and neck supple.  Lymphadenopathy:     Cervical: No cervical adenopathy.  Skin:    General: Skin is warm and dry.  Neurological:     Mental Status: She is alert and oriented to person, place, and time.     Cranial Nerves: No cranial nerve deficit.  Psychiatric:        Behavior: Behavior normal.        Thought Content: Thought content normal.        Judgment: Judgment normal.        Assessment/Plan: 1. Type 2 diabetes mellitus with hyperglycemia, without long-term current use of insulin (HCC) Worsening hg a1c of 7.1. change metformin to 500 mg once a day and add invokana, avoid all other nephrotoxic medications, avoid  - POCT glycosylated hemoglobin (Hb A1C)  2. Other insomnia Continue Ambien as before  - zolpidem (AMBIEN) 10 MG tablet; Take 1 tablet (10 mg total) by mouth at  bedtime as needed for sleep.  Dispense: 90 tablet; Refill: 0  3. Depression, major, single episode, severe ([place]) Well controlled   4. Age-related osteoporosis without current pathological fracture Continue Fosamax as before , encouraged wt bearing   General Counseling: Mairin verbalizes understanding of the findings of todays visit and agrees with plan of treatment. I have discussed any further diagnostic evaluation that may be needed or ordered today. We also reviewed her medications today. she has been encouraged to call the office with any questions or concerns that should arise related to todays visit.    Orders Placed This Encounter  Procedures  . POCT glycosylated hemoglobin (Hb A1C)     Total time spent: 30 Minutes Time spent includes review of chart, medications, test results, and follow up plan with the patient.   [place] Controlled Substance Database was reviewed by me.   Dr Lavera Guise Internal medicine

## 2020-04-07 NOTE — Telephone Encounter (Signed)
Patient left and did not schedule appointment at check out. beth

## 2020-04-13 ENCOUNTER — Encounter: Payer: Self-pay | Admitting: Internal Medicine

## 2020-04-14 ENCOUNTER — Ambulatory Visit: Payer: Medicare Other | Admitting: Hospice and Palliative Medicine

## 2020-04-16 DIAGNOSIS — E119 Type 2 diabetes mellitus without complications: Secondary | ICD-10-CM | POA: Diagnosis not present

## 2020-04-17 ENCOUNTER — Other Ambulatory Visit: Payer: Self-pay | Admitting: Internal Medicine

## 2020-04-21 ENCOUNTER — Telehealth: Payer: Self-pay

## 2020-04-21 NOTE — Telephone Encounter (Signed)
Sent PA for Invokana 100 mg thru for [employer] and [employer] and both came back denied.  Per Dr Humphrey Rolls pt can continue on metformin for now and discuss changing when Pt is seen by Leretha Pol on 05/25/20

## 2020-05-22 ENCOUNTER — Ambulatory Visit: Payer: TRICARE For Life (TFL) | Admitting: Nurse Practitioner

## 2020-05-25 ENCOUNTER — Encounter: Payer: Self-pay | Admitting: Nurse Practitioner

## 2020-05-25 ENCOUNTER — Other Ambulatory Visit: Payer: Self-pay

## 2020-05-25 ENCOUNTER — Ambulatory Visit (INDEPENDENT_AMBULATORY_CARE_PROVIDER_SITE_OTHER): Payer: Medicare Other | Admitting: Nurse Practitioner

## 2020-05-25 VITALS — BP 142/79 | HR 65 | Temp 96.7°F | Ht 65.0 in | Wt 181.5 lb

## 2020-05-25 DIAGNOSIS — M810 Age-related osteoporosis without current pathological fracture: Secondary | ICD-10-CM

## 2020-05-25 DIAGNOSIS — G4709 Other insomnia: Secondary | ICD-10-CM

## 2020-05-25 DIAGNOSIS — E782 Mixed hyperlipidemia: Secondary | ICD-10-CM | POA: Diagnosis not present

## 2020-05-25 DIAGNOSIS — Z7689 Persons encountering health services in other specified circumstances: Secondary | ICD-10-CM | POA: Insufficient documentation

## 2020-05-25 DIAGNOSIS — G8929 Other chronic pain: Secondary | ICD-10-CM | POA: Diagnosis not present

## 2020-05-25 DIAGNOSIS — K219 Gastro-esophageal reflux disease without esophagitis: Secondary | ICD-10-CM | POA: Diagnosis not present

## 2020-05-25 DIAGNOSIS — I10 Essential (primary) hypertension: Secondary | ICD-10-CM | POA: Diagnosis not present

## 2020-05-25 DIAGNOSIS — G2581 Restless legs syndrome: Secondary | ICD-10-CM

## 2020-05-25 DIAGNOSIS — E039 Hypothyroidism, unspecified: Secondary | ICD-10-CM | POA: Diagnosis not present

## 2020-05-25 DIAGNOSIS — M25512 Pain in left shoulder: Secondary | ICD-10-CM

## 2020-05-25 DIAGNOSIS — F322 Major depressive disorder, single episode, severe without psychotic features: Secondary | ICD-10-CM

## 2020-05-25 DIAGNOSIS — E1169 Type 2 diabetes mellitus with other specified complication: Secondary | ICD-10-CM | POA: Insufficient documentation

## 2020-05-25 DIAGNOSIS — M25511 Pain in right shoulder: Secondary | ICD-10-CM

## 2020-05-25 DIAGNOSIS — F411 Generalized anxiety disorder: Secondary | ICD-10-CM

## 2020-05-25 MED ORDER — LISINOPRIL 20 MG PO TABS: ORAL_TABLET | ORAL | 3 refills | Status: DC

## 2020-05-25 MED ORDER — DILTIAZEM HCL ER COATED BEADS 240 MG PO CP24: ORAL_CAPSULE | ORAL | 1 refills | Status: DC

## 2020-05-25 MED ORDER — SUCRALFATE 1 G PO TABS: ORAL_TABLET | ORAL | 1 refills | Status: DC

## 2020-05-25 MED ORDER — LEVOTHYROXINE SODIUM 50 MCG PO TABS: 50.0000 ug | ORAL_TABLET | Freq: Every morning | ORAL | 1 refills | Status: DC

## 2020-05-25 MED ORDER — ZOLPIDEM TARTRATE 10 MG PO TABS: 10.0000 mg | ORAL_TABLET | Freq: Every evening | ORAL | 0 refills | Status: DC | PRN

## 2020-05-25 MED ORDER — METOPROLOL TARTRATE 25 MG PO TABS: 25.0000 mg | ORAL_TABLET | Freq: Two times a day (BID) | ORAL | 1 refills | Status: DC

## 2020-05-25 MED ORDER — CITALOPRAM HYDROBROMIDE 40 MG PO TABS: 40.0000 mg | ORAL_TABLET | Freq: Every day | ORAL | 1 refills | Status: DC

## 2020-05-25 MED ORDER — ROPINIROLE HCL 0.25 MG PO TABS: 0.2500 mg | ORAL_TABLET | Freq: Two times a day (BID) | ORAL | 1 refills | Status: DC

## 2020-05-25 MED ORDER — BUSPIRONE HCL 10 MG PO TABS
ORAL_TABLET | ORAL | 1 refills | Status: DC
Start: 2020-05-25 — End: 2020-12-28

## 2020-05-25 MED ORDER — QUETIAPINE FUMARATE 50 MG PO TABS: 50.0000 mg | ORAL_TABLET | Freq: Two times a day (BID) | ORAL | 1 refills | Status: DC

## 2020-05-25 MED ORDER — ATORVASTATIN CALCIUM 20 MG PO TABS: 20.0000 mg | ORAL_TABLET | Freq: Every day | ORAL | 1 refills | Status: DC

## 2020-05-25 MED ORDER — PANTOPRAZOLE SODIUM 40 MG PO TBEC: 40.0000 mg | DELAYED_RELEASE_TABLET | Freq: Two times a day (BID) | ORAL | 1 refills | Status: DC

## 2020-05-25 MED ORDER — DICLOFENAC SODIUM 1 % EX GEL: 4.0000 g | Freq: Four times a day (QID) | CUTANEOUS | 2 refills | Status: DC

## 2020-05-25 MED ORDER — ALENDRONATE SODIUM 35 MG PO TABS: 35.0000 mg | ORAL_TABLET | ORAL | 1 refills | Status: DC

## 2020-05-25 NOTE — Progress Notes (Signed)
New Patient Office Visit  Subjective:  Patient ID: [name], female    DOB: 02-12-1945  Age: 75 y.o. MRN: [id number]  CC:  Chief Complaint  Patient presents with  . New Patient (Initial Visit)    HPI [name] presents to establish new primary care office. She has opted to change offices, staying with her previous PCP to maintain continuity of care. She has long history of diabetes which is well managed. Last check of her HgbA1c was 7.1. she does take metformin 500 mg twice daily. An attempt was made to change her from metformin to invokana at her most recent visit with other PCP office. Insurance did not cover this so it was discontinued. Patient continues to take the metformin. Needs to be added back to her medication list. She did have diabetic eye exam in March 2022. She reports that she had no diabetic retinopathy. Her prescription for lenses changed and she does need to get new glasses.  Blood pressure is slightly elevated today. She states that she checks it regularly at home. Generally running 130s/80s. She will continue to monitor it closely at home.  She has some increased shoulder pain. She does see orthopedic provider. She is prescribed meloxicam for this. Is wondering if there is anything topical she can use to help a bit more with pain and inflammation.  No new concerns or complaints. Denies cheat pain, chest pressure, shortness of breath, or headaches. Denies nausea, vomiting, or changes in bowel or bladder habits.  She does have some significant issues with anxiety and insomnia. Has been taking zolpidem at bedtime when needed to help her sleep. Has tried multiple alternatives for this, none of which have helped without making her feel drowsy and sluggish the next day.  She needs to have new prescriptions for all routine medication which reflect the new office.   Past Medical History:  Diagnosis Date  . Anxiety   . Depression   . Diabetes mellitus, type II  ([place])   . GERD (gastroesophageal reflux disease)   . Hypertension   . Thyroid disease     Past Surgical History:  Procedure Laterality Date  . ABDOMINAL HYSTERECTOMY    . BREAST EXCISIONAL BIOPSY Bilateral 80s   benign  . BREAST SURGERY    . COLONOSCOPY WITH PROPOFOL N/A 04/12/2019   Procedure: COLONOSCOPY WITH PROPOFOL;  Surgeon: Jonathon Bellows, MD;  Location: [facility] END[facility]COPY;  Service: Gastroenterology;  Laterality: N/A;  . JOINT REPLACEMENT Right 2019    Family History  Problem Relation Age of Onset  . Anxiety disorder Mother   . Colon cancer Mother   . Heart disease Father   . Anxiety disorder Father   . Obesity Sister   . Anxiety disorder Sister   . Depression Sister   . Osteoporosis Sister   . Breast cancer Paternal Aunt     Social History   Socioeconomic History  . Marital status: Widowed    Spouse name: Not on file  . Number of children: Not on file  . Years of education: Not on file  . Highest education level: Not on file  Occupational History  . Not on file  Tobacco Use  . Smoking status: Never Smoker  . Smokeless tobacco: Never Used  Vaping Use  . Vaping Use: Never used  Substance and Sexual Activity  . Alcohol use: No    Alcohol/week: 0.0 standard drinks  . Drug use: Never  . Sexual activity: Never  Other Topics Concern  .  Not on file  Social History Narrative  . Not on file   Social Determinants of Health   Financial Resource Strain: Not on file  Food Insecurity: Not on file  Transportation Needs: Not on file  Physical Activity: Not on file  Stress: Not on file  Social Connections: Not on file  Intimate Partner Violence: Not on file    ROS Review of Systems  Constitutional: Negative for activity change, chills and fever.  HENT: Negative for congestion, postnasal drip, rhinorrhea, sinus pressure and sinus pain.   Eyes: Negative.   Respiratory: Negative for cough and wheezing.   Cardiovascular: Negative for chest pain and palpitations.   Gastrointestinal: Negative for constipation, diarrhea, nausea and vomiting.  Endocrine: Negative for cold intolerance, heat intolerance, polydipsia and polyuria.       Blood sugars doing well   Genitourinary: Negative.   Musculoskeletal: Positive for arthralgias. Negative for back pain and myalgias.       Increased shoulder tenderness and pain.   Skin: Negative for rash.  Allergic/Immunologic: Negative.   Neurological: Negative for dizziness, weakness and headaches.  Psychiatric/Behavioral: Positive for dysphoric mood and sleep disturbance. The patient is nervous/anxious.        Generally well managed on current medications.   All other systems reviewed and are negative.   Objective:   Today's Vitals   05/25/20 1013 05/25/20 1045  BP: (!) 145/77 (!) 142/79  Pulse: 63 65  Temp: (!) 96.7 F (35.9 C)   SpO2: 99%   Weight: 181 lb 8 oz (82.3 kg)   Height: 5\' 5"  (1.651 m)    Body mass index is 30.2 kg/m.   Physical Exam Vitals and nursing note reviewed.  Constitutional:      Appearance: Normal appearance. She is well-developed.  HENT:     Head: Normocephalic and atraumatic.     Nose: Nose normal.  Eyes:     Extraocular Movements: Extraocular movements intact.     Conjunctiva/sclera: Conjunctivae normal.     Pupils: Pupils are equal, round, and reactive to light.  Neck:     Vascular: No carotid bruit.  Cardiovascular:     Rate and Rhythm: Normal rate and regular rhythm.     Pulses: Normal pulses.     Heart sounds: Normal heart sounds.  Pulmonary:     Effort: Pulmonary effort is normal.     Breath sounds: Normal breath sounds.  Abdominal:     General: Bowel sounds are normal.     Palpations: Abdomen is soft.     Tenderness: There is no abdominal tenderness.  Musculoskeletal:        General: Normal range of motion.     Cervical back: Normal range of motion and neck supple.  Skin:    General: Skin is warm and dry.     Capillary Refill: Capillary refill takes less  than 2 seconds.  Neurological:     General: No focal deficit present.     Mental Status: She is alert and oriented to person, place, and time.     Cranial Nerves: Cranial nerve deficit present.  Psychiatric:        Mood and Affect: Mood normal.        Behavior: Behavior normal.        Thought Content: Thought content normal.        Judgment: Judgment normal.     Assessment & Plan:  1. Encounter to establish care Appointment today to establish new primary care office, keeping PCP to  maintain continuity of care.   2. Essential hypertension Stable. Continue metoprolol, diltiazem, and lisinopril as prescribed. Refills provided for all today.  - metoprolol tartrate (LOPRESSOR) 25 MG tablet; Take 1 tablet (25 mg total) by mouth 2 (two) times daily.  Dispense: 180 tablet; Refill: 1 - diltiazem (CARDIZEM CD) 240 MG 24 hr capsule; TAKE 1 CAPSULE(240 MG) BY MOUTH DAILY  Dispense: 90 capsule; Refill: 1 - lisinopril (ZESTRIL) 20 MG tablet; TAKE 1 TABLET(20 MG) BY MOUTH DAILY  Dispense: 90 tablet; Refill: 3  3. Mixed hyperlipidemia Continue atorvastatin as prescribed  - atorvastatin (LIPITOR) 20 MG tablet; Take 1 tablet (20 mg total) by mouth daily.  Dispense: 90 tablet; Refill: 1  4. Gastroesophageal reflux disease without esophagitis Continue pantoprazole twice daily and sucralfate as prescribed.  - pantoprazole (PROTONIX) 40 MG tablet; Take 1 tablet (40 mg total) by mouth 2 (two) times daily.  Dispense: 180 tablet; Refill: 1 - sucralfate (CARAFATE) 1 g tablet; TAKE 1 TABLET BY MOUTH FOUR TIMES A DAY IF NEEDED  Dispense: 360 tablet; Refill: 1  5. Acquired hypothyroidism conitnue levothyroxine as prescribed  - levothyroxine (SYNTHROID) 50 MCG tablet; Take 1 tablet (50 mcg total) by mouth every morning.  Dispense: 90 tablet; Refill: 1  6. Chronic pain of both shoulders Add voltaren gel. May apply to effected areas up to four times daily if needed for pain and inflammation.  - diclofenac  Sodium (VOLTAREN) 1 % GEL; Apply 4 g topically 4 (four) times daily.  Dispense: 100 g; Refill: 2  7. Age related osteoporosis, unspecified pathological fracture presence Continue fosamax as prescribed  - alendronate (FOSAMAX) 35 MG tablet; Take 1 tablet (35 mg total) by mouth every 7 (seven) days. Take with a full glass of water on an empty stomach.  Dispense: 12 tablet; Refill: 1  8. Depression, major, single episode, severe ([place]) Currently well managed. Continue citalopram and seroquel as prescribed. Refills provided today.  - citalopram (CELEXA) 40 MG tablet; Take 1 tablet (40 mg total) by mouth daily.  Dispense: 90 tablet; Refill: 1 - QUEtiapine (SEROQUEL) 50 MG tablet; Take 1 tablet (50 mg total) by mouth 2 (two) times daily.  Dispense: 180 tablet; Refill: 1  9. Restless leg syndrome Stable. May continue requip as prescribed  - rOPINIRole (REQUIP) 0.25 MG tablet; Take 1 tablet (0.25 mg total) by mouth 2 times daily at 12 noon and 4 pm.  Dispense: 180 tablet; Refill: 1  10. Other insomnia May continue to take zolpidem as needed and as prescribed. Discussed alternatives for treatment of insomnia. Have tried many and have caused negative side effects and drowsiness the next day. New prescription for zolpidem sent to her pharmacy  - zolpidem (AMBIEN) 10 MG tablet; Take 1 tablet (10 mg total) by mouth at bedtime as needed for sleep.  Dispense: 90 tablet; Refill: 0  11. Anxiety, generalized May take buspar 10mg  up to twice daily as needed for acute anxiety  - busPIRone (BUSPAR) 10 MG tablet; Take 1/2 to 1 tablet by mouth twice daily.  Dispense: 180 tablet; Refill: 1    Problem List Items Addressed This Visit      Cardiovascular and Mediastinum   Essential hypertension   Relevant Medications   atorvastatin (LIPITOR) 20 MG tablet   diltiazem (CARDIZEM CD) 240 MG 24 hr capsule   lisinopril (ZESTRIL) 20 MG tablet   metoprolol tartrate (LOPRESSOR) 25 MG tablet     Digestive    Gastroesophageal reflux disease without esophagitis   Relevant Medications  pantoprazole (PROTONIX) 40 MG tablet   sucralfate (CARAFATE) 1 g tablet     Endocrine   Hypothyroidism   Relevant Medications   levothyroxine (SYNTHROID) 50 MCG tablet   metoprolol tartrate (LOPRESSOR) 25 MG tablet     Musculoskeletal and Integument   Primary osteoarthritis of right knee   Relevant Medications   diclofenac Sodium (VOLTAREN) 1 % GEL   Age related osteoporosis   Relevant Medications   alendronate (FOSAMAX) 35 MG tablet     Other   Anxiety, generalized   Relevant Medications   busPIRone (BUSPAR) 10 MG tablet   citalopram (CELEXA) 40 MG tablet   Other insomnia   Relevant Medications   zolpidem (AMBIEN) 10 MG tablet   Depression, major, single episode, severe (HCC)   Relevant Medications   busPIRone (BUSPAR) 10 MG tablet   citalopram (CELEXA) 40 MG tablet   QUEtiapine (SEROQUEL) 50 MG tablet   Restless leg syndrome   Relevant Medications   rOPINIRole (REQUIP) 0.25 MG tablet   Encounter to establish care - Primary   Mixed hyperlipidemia   Relevant Medications   atorvastatin (LIPITOR) 20 MG tablet   diltiazem (CARDIZEM CD) 240 MG 24 hr capsule   lisinopril (ZESTRIL) 20 MG tablet   metoprolol tartrate (LOPRESSOR) 25 MG tablet    Other Visit Diagnoses    Essential hypertension, benign       Relevant Medications   atorvastatin (LIPITOR) 20 MG tablet   diltiazem (CARDIZEM CD) 240 MG 24 hr capsule   lisinopril (ZESTRIL) 20 MG tablet   metoprolol tartrate (LOPRESSOR) 25 MG tablet      Outpatient Encounter Medications as of 05/25/2020  Medication Sig  . Accu-Chek FastClix Lancets MISC 1 each by Does not apply route 2 (two) times daily. Use as directed twice daily diag 111.65  . aspirin 81 MG tablet Take 1 tablet by mouth daily.  . diclofenac Sodium (VOLTAREN) 1 % GEL Apply 4 g topically 4 (four) times daily.  Marland Kitchen FLUAD 0.5 ML SUSY ADM 0.5ML IM UTD  . glucose blood (ACCU-CHEK GUIDE)  test strip Use as directed twice a day diag e11.65  . meloxicam (MOBIC) 15 MG tablet Take 15 mg by mouth daily.  . promethazine (PHENERGAN) 25 MG tablet Take 1 tablet (25 mg total) by mouth 2 (two) times daily as needed for nausea or vomiting.  Marland Kitchen tiZANidine (ZANAFLEX) 2 MG tablet Take 1 tablet (2 mg total) by mouth 2 (two) times daily as needed for muscle spasms.  . Zoster Vaccine Adjuvanted [facility]) injection Shingles vaccine series - inject IM as directed .  . [DISCONTINUED] alendronate (FOSAMAX) 35 MG tablet Take 1 tablet (35 mg total) by mouth every 7 (seven) days. Take with a full glass of water on an empty stomach.  . [DISCONTINUED] atorvastatin (LIPITOR) 20 MG tablet Take 1 tablet (20 mg total) by mouth daily.  . [DISCONTINUED] busPIRone (BUSPAR) 10 MG tablet Take 1/2 to 1 tablet by mouth twice daily.  . [DISCONTINUED] citalopram (CELEXA) 40 MG tablet Take 1 tablet (40 mg total) by mouth daily.  . [DISCONTINUED] diltiazem (CARDIZEM CD) 240 MG 24 hr capsule TAKE 1 CAPSULE(240 MG) BY MOUTH DAILY  . [DISCONTINUED] levothyroxine (SYNTHROID) 50 MCG tablet TAKE 1 TABLET EVERY MORNING  . [DISCONTINUED] lisinopril (ZESTRIL) 20 MG tablet TAKE 1 TABLET(20 MG) BY MOUTH DAILY  . [DISCONTINUED] metoprolol tartrate (LOPRESSOR) 25 MG tablet Take 1 tablet (25 mg total) by mouth 2 (two) times daily.  . [DISCONTINUED] pantoprazole (PROTONIX) 40 MG tablet  Take 1 tablet (40 mg total) by mouth 2 (two) times daily.  . [DISCONTINUED] QUEtiapine (SEROQUEL) 50 MG tablet Take 1 tablet (50 mg total) by mouth 2 (two) times daily.  . [DISCONTINUED] rOPINIRole (REQUIP) 0.25 MG tablet Take 1 tablet (0.25 mg total) by mouth 2 times daily at 12 noon and 4 pm.  . [DISCONTINUED] sucralfate (CARAFATE) 1 g tablet TAKE 1 TABLET BY MOUTH FOUR TIMES A DAY IF NEEDED  . [DISCONTINUED] zolpidem (AMBIEN) 10 MG tablet Take 1 tablet (10 mg total) by mouth at bedtime as needed for sleep.  Marland Kitchen alendronate (FOSAMAX) 35 MG tablet Take 1  tablet (35 mg total) by mouth every 7 (seven) days. Take with a full glass of water on an empty stomach.  Marland Kitchen atorvastatin (LIPITOR) 20 MG tablet Take 1 tablet (20 mg total) by mouth daily.  . busPIRone (BUSPAR) 10 MG tablet Take 1/2 to 1 tablet by mouth twice daily.  . citalopram (CELEXA) 40 MG tablet Take 1 tablet (40 mg total) by mouth daily.  Marland Kitchen diltiazem (CARDIZEM CD) 240 MG 24 hr capsule TAKE 1 CAPSULE(240 MG) BY MOUTH DAILY  . levothyroxine (SYNTHROID) 50 MCG tablet Take 1 tablet (50 mcg total) by mouth every morning.  Marland Kitchen lisinopril (ZESTRIL) 20 MG tablet TAKE 1 TABLET(20 MG) BY MOUTH DAILY  . metoprolol tartrate (LOPRESSOR) 25 MG tablet Take 1 tablet (25 mg total) by mouth 2 (two) times daily.  . pantoprazole (PROTONIX) 40 MG tablet Take 1 tablet (40 mg total) by mouth 2 (two) times daily.  . QUEtiapine (SEROQUEL) 50 MG tablet Take 1 tablet (50 mg total) by mouth 2 (two) times daily.  Marland Kitchen rOPINIRole (REQUIP) 0.25 MG tablet Take 1 tablet (0.25 mg total) by mouth 2 times daily at 12 noon and 4 pm.  . sucralfate (CARAFATE) 1 g tablet TAKE 1 TABLET BY MOUTH FOUR TIMES A DAY IF NEEDED  . zolpidem (AMBIEN) 10 MG tablet Take 1 tablet (10 mg total) by mouth at bedtime as needed for sleep.  . [DISCONTINUED] canagliflozin (INVOKANA) 100 MG TABS tablet Take one tab po q am for dm  . [DISCONTINUED] Crisaborole (EUCRISA) 2 % OINT Apply 1 application topically 2 (two) times daily as needed.  . [DISCONTINUED] Fluocinolone Acetonide 0.01 % OIL Place 5 drops in ear(s) 2 (two) times a day.   No facility-administered encounter medications on file as of 05/25/2020.    Follow-up: Return in about 3 months (around 08/24/2020) for routine - DM/HTN - check HgbA1c .   Ronnell Freshwater, NP

## 2020-05-25 NOTE — Patient Instructions (Signed)

## 2020-06-03 ENCOUNTER — Telehealth: Payer: Self-pay | Admitting: Nurse Practitioner

## 2020-06-03 NOTE — Telephone Encounter (Signed)
Pt denies any symptoms. Pt advised to keep a 2 week log of BP at home and call with readings if consistently 140/90 or higher. Pt verbalized understanding and was agreeable. AS, CMA

## 2020-06-03 NOTE — Telephone Encounter (Signed)
Patient called in stating she was in office two weeks ago and her blood pressure was high and decided to wait on doing anything and her blood pressure is still running high, 150/75. Please advise, thanks.

## 2020-06-03 NOTE — Telephone Encounter (Signed)
Is she having symptoms, like headache or palpitations? I would rather hold off on any changes to medication until she is seen again. Blood pressure was a bit lower than that at her visit. This is something I would really just want to monitor for the time being. If no symptoms.

## 2020-06-29 ENCOUNTER — Ambulatory Visit (INDEPENDENT_AMBULATORY_CARE_PROVIDER_SITE_OTHER): Payer: Medicare Other | Admitting: Nurse Practitioner

## 2020-06-29 ENCOUNTER — Encounter: Payer: Self-pay | Admitting: Nurse Practitioner

## 2020-06-29 VITALS — BP 114/53 | HR 65 | Ht 60.0 in | Wt 181.0 lb

## 2020-06-29 DIAGNOSIS — E1165 Type 2 diabetes mellitus with hyperglycemia: Secondary | ICD-10-CM | POA: Diagnosis not present

## 2020-06-29 DIAGNOSIS — I10 Essential (primary) hypertension: Secondary | ICD-10-CM | POA: Diagnosis not present

## 2020-06-29 DIAGNOSIS — F322 Major depressive disorder, single episode, severe without psychotic features: Secondary | ICD-10-CM

## 2020-06-29 DIAGNOSIS — F411 Generalized anxiety disorder: Secondary | ICD-10-CM | POA: Diagnosis not present

## 2020-06-29 MED ORDER — QUETIAPINE FUMARATE 50 MG PO TABS
50.0000 mg | ORAL_TABLET | Freq: Two times a day (BID) | ORAL | 1 refills | Status: DC
Start: 2020-06-29 — End: 2020-06-29

## 2020-06-29 MED ORDER — QUETIAPINE FUMARATE 50 MG PO TABS: 50.0000 mg | ORAL_TABLET | Freq: Three times a day (TID) | ORAL | 1 refills | Status: DC

## 2020-06-29 MED ORDER — LISINOPRIL 20 MG PO TABS: ORAL_TABLET | ORAL | 3 refills | Status: DC

## 2020-06-29 NOTE — Progress Notes (Addendum)
Virtual Visit via Telephone Note  I connected with [name] on 06/29/20 at  2:30 PM EDT by telephone and verified that I am speaking with the correct person using two identifiers.  Location: Patient: home Provider: home of provider   I discussed the limitations, risks, security and privacy concerns of performing an evaluation and management service by telephone and the availability of in person appointments. I also discussed with the patient that there may be a patient responsible charge related to this service. The patient expressed understanding and agreed to proceed.   History of Present Illness: The patient states that for the past few months, she has noted some elevated blood pressure readings. She states that sometimes, it is as high as 153/68, and 144/63. She states that today, she has taken herr blood pressure twice, and both times it was normal. She states that her heart rate is normal for her. It is usually around 66. She denies chest pain, chest pressure, palpitations, or shortness of breath. She states this has been bothering her for a few months.  She also states that anxiety has been giving her trouble off and on. States that she worries for a few hours during the day, then it will calm down. She states then it will ramp p again, then calm down. She states that anxiety is very bothersome to her when it is acting up. This has been an issue in the past. Increased dosing of seroquel 50 mg to twice daily. She states that this seems to really help when she gets so nervous. It does not cause her negative side effects.    Observations/Objective:  The patient is alert and oriented. She is pleasant and answers all questions appropriately. Breathing is non-labored. She is in no acute distress at this time.   Assessment and Plan: 1. Essential hypertension, benign Increase lisinopril to 30mg  daily. Continue other blood pressure medications as prescribed. Encouraged her to monitor her  blood pressure routinely. Goal is to keep systolic pressure between 120 and 756 and diastolic pressure between 60 and 80. She will contact the office if blood pressure becomes too high or does not come down with medication change.  - lisinopril (ZESTRIL) 20 MG tablet; TAKE 1.5 tablets po QD  Dispense: 135 tablet; Refill: 3  2. Anxiety, generalized Add Seroquel 25mg  in the afternoons if needed for severe anxiety. Continue other anxiety medications as prescribed   3. Depression, major, single episode, severe (HCC) Add seroquel 50mg  dose in afternoons if needed for severe anxiety. Continue all other medications for depression/anxieyt as previously prescribed  - QUEtiapine (SEROQUEL) 50 MG tablet; Take 1 tablet (50 mg total) by mouth 2 (two) times daily. May take 1 tablet in afternoon PRN severe anxiety  Dispense: 270 tablet; Refill: 1  4. Type 2 diabetes mellitus with hyperglycemia, without long-term current use of insulin (HCC) Continue all diabetic medication as prescribed   Follow Up Instructions:    I discussed the assessment and treatment plan with the patient. The patient was provided an opportunity to ask questions and all were answered. The patient agreed with the plan and demonstrated an understanding of the instructions.   The patient was advised to call back or seek an in-person evaluation if the symptoms worsen or if the condition fails to improve as anticipated.  I provided 22 minutes of non-face-to-face time during this encounter.   Ronnell Freshwater, NP

## 2020-06-29 NOTE — Addendum Note (Signed)
Addended by: Leretha Pol on: 06/29/2020 03:04 PM   Modules accepted: Orders

## 2020-06-29 NOTE — Patient Instructions (Signed)
Managing Anxiety, Adult After being diagnosed with an anxiety disorder, you may be relieved to know why you have felt or behaved a certain way. You may also feel overwhelmed about the treatment ahead and what it will mean for your life. With care and support, you can manage this condition and recover from it. How to manage lifestyle changes Managing stress and anxiety Stress is your body's reaction to life changes and events, both good and bad. Most stress will last just a few hours, but stress can be ongoing and can lead to more than just stress. Although stress can play a major role in anxiety, it is not the same as anxiety. Stress is usually caused by something external, such as a deadline, test, or competition. Stress normally passes after the triggering event has ended.  Anxiety is caused by something internal, such as imagining a terrible outcome or worrying that something will go wrong that will devastate you. Anxiety often does not go away even after the triggering event is over, and it can become long-term (chronic) worry. It is important to understand the differences between stress and anxiety and to manage your stress effectively so that it does not lead to an anxious response. Talk with your health care provider or a counselor to learn more about reducing anxiety and stress. He or she may suggest tension reduction techniques, such as:  Music therapy. This can include creating or listening to music that you enjoy and that inspires you.  Mindfulness-based meditation. This involves being aware of your normal breaths while not trying to control your breathing. It can be done while sitting or walking.  Centering prayer. This involves focusing on a word, phrase, or sacred image that means something to you and brings you peace.  Deep breathing. To do this, expand your stomach and inhale slowly through your nose. Hold your breath for 3-5 seconds. Then exhale slowly, letting your stomach muscles  relax.  Self-talk. This involves identifying thought patterns that lead to anxiety reactions and changing those patterns.  Muscle relaxation. This involves tensing muscles and then relaxing them. Choose a tension reduction technique that suits your lifestyle and personality. These techniques take time and practice. Set aside 5-15 minutes a day to do them. Therapists can offer counseling and training in these techniques. The training to help with anxiety may be covered by some insurance plans. Other things you can do to manage stress and anxiety include:  Keeping a stress/anxiety diary. This can help you learn what triggers your reaction and then learn ways to manage your response.  Thinking about how you react to certain situations. You may not be able to control everything, but you can control your response.  Making time for activities that help you relax and not feeling guilty about spending your time in this way.  Visual imagery and yoga can help you stay calm and relax.   Medicines Medicines can help ease symptoms. Medicines for anxiety include:  Anti-anxiety drugs.  Antidepressants. Medicines are often used as a primary treatment for anxiety disorder. Medicines will be prescribed by a health care provider. When used together, medicines, psychotherapy, and tension reduction techniques may be the most effective treatment. Relationships Relationships can play a big part in helping you recover. Try to spend more time connecting with trusted friends and family members. Consider going to couples counseling, taking family education classes, or going to family therapy. Therapy can help you and others better understand your condition. How to recognize changes in your   anxiety Everyone responds differently to treatment for anxiety. Recovery from anxiety happens when symptoms decrease and stop interfering with your daily activities at home or work. This may mean that you will start to:  Have  better concentration and focus. Worry will interfere less in your daily thinking.  Sleep better.  Be less irritable.  Have more energy.  Have improved memory. It is important to recognize when your condition is getting worse. Contact your health care provider if your symptoms interfere with home or work and you feel like your condition is not improving. Follow these instructions at home: Activity  Exercise. Most adults should do the following: ? Exercise for at least 150 minutes each week. The exercise should increase your heart rate and make you sweat (moderate-intensity exercise). ? Strengthening exercises at least twice a week.  Get the right amount and quality of sleep. Most adults need 7-9 hours of sleep each night. Lifestyle  Eat a healthy diet that includes plenty of vegetables, fruits, whole grains, low-fat dairy products, and lean protein. Do not eat a lot of foods that are high in solid fats, added sugars, or salt.  Make choices that simplify your life.  Do not use any products that contain nicotine or tobacco, such as cigarettes, e-cigarettes, and chewing tobacco. If you need help quitting, ask your health care provider.  Avoid caffeine, alcohol, and certain over-the-counter cold medicines. These may make you feel worse. Ask your pharmacist which medicines to avoid.   General instructions  Take over-the-counter and prescription medicines only as told by your health care provider.  Keep all follow-up visits as told by your health care provider. This is important. Where to find support You can get help and support from these sources:  Self-help groups.  Online and community organizations.  A trusted spiritual leader.  Couples counseling.  Family education classes.  Family therapy. Where to find more information You may find that joining a support group helps you deal with your anxiety. The following sources can help you locate counselors or support groups near  you:  Mental Health America: www.mentalhealthamerica.net  Anxiety and Depression Association of America (ADAA): www.adaa.org  National Alliance on Mental Illness (NAMI): www.nami.org Contact a health care provider if you:  Have a hard time staying focused or finishing daily tasks.  Spend many hours a day feeling worried about everyday life.  Become exhausted by worry.  Start to have headaches, feel tense, or have nausea.  Urinate more than normal.  Have diarrhea. Get help right away if you have:  A racing heart and shortness of breath.  Thoughts of hurting yourself or others. If you ever feel like you may hurt yourself or others, or have thoughts about taking your own life, get help right away. You can go to your nearest emergency department or call:  Your local emergency services (911 in the [place]  A suicide crisis helpline, such as the National Suicide Prevention Lifeline at 1-800-273-8255. This is open 24 hours a day. Summary  Taking steps to learn and use tension reduction techniques can help calm you and help prevent triggering an anxiety reaction.  When used together, medicines, psychotherapy, and tension reduction techniques may be the most effective treatment.  Family, friends, and partners can play a big part in helping you recover from an anxiety disorder. This information is not intended to replace advice given to you by your health care provider. Make sure you discuss any questions you have with your health care provider. Document   Revised: 06/26/2018 Document Reviewed: 06/26/2018 Elsevier Patient Education  [place].  Blood Pressure Record Sheet To take your blood pressure, you will need a blood pressure machine. You can buy a blood pressure machine (blood pressure monitor) at your clinic, drug store, or online. When choosing one, consider:  An automatic monitor that has an arm cuff.  A cuff that wraps snugly around your upper arm. You should be  able to fit only one finger between your arm and the cuff.  A device that stores blood pressure reading results.  Do not choose a monitor that measures your blood pressure from your wrist or finger. Follow your health care provider's instructions for how to take your blood pressure. To use this form:  Get one reading in the morning (a.m.) before you take any medicines.  Get one reading in the evening (p.m.) before supper.  Take at least 2 readings with each blood pressure check. This makes sure the results are correct. Wait 1-2 minutes between measurements.  Write down the results in the spaces on this form.  Repeat this once a week, or as told by your health care provider.  Make a follow-up appointment with your health care provider to discuss the results. Blood pressure log Date: _______________________  a.m. _____________________(1st reading) _____________________(2nd reading)  p.m. _____________________(1st reading) _____________________(2nd reading) Date: _______________________  a.m. _____________________(1st reading) _____________________(2nd reading)  p.m. _____________________(1st reading) _____________________(2nd reading) Date: _______________________  a.m. _____________________(1st reading) _____________________(2nd reading)  p.m. _____________________(1st reading) _____________________(2nd reading) Date: _______________________  a.m. _____________________(1st reading) _____________________(2nd reading)  p.m. _____________________(1st reading) _____________________(2nd reading) Date: _______________________  a.m. _____________________(1st reading) _____________________(2nd reading)  p.m. _____________________(1st reading) _____________________(2nd reading) This information is not intended to replace advice given to you by your health care provider. Make sure you discuss any questions you have with your health care provider. Document Revised: 05/15/2019  Document Reviewed: 05/15/2019 Elsevier Patient Education  2021 [employer].

## 2020-07-09 ENCOUNTER — Other Ambulatory Visit: Payer: Self-pay | Admitting: Orthopedic Surgery

## 2020-07-09 DIAGNOSIS — M5441 Lumbago with sciatica, right side: Secondary | ICD-10-CM | POA: Diagnosis not present

## 2020-07-09 DIAGNOSIS — E1165 Type 2 diabetes mellitus with hyperglycemia: Secondary | ICD-10-CM | POA: Diagnosis not present

## 2020-07-09 DIAGNOSIS — M48061 Spinal stenosis, lumbar region without neurogenic claudication: Secondary | ICD-10-CM | POA: Diagnosis not present

## 2020-07-09 DIAGNOSIS — M4807 Spinal stenosis, lumbosacral region: Secondary | ICD-10-CM

## 2020-07-09 DIAGNOSIS — M545 Low back pain, unspecified: Secondary | ICD-10-CM | POA: Diagnosis not present

## 2020-07-09 DIAGNOSIS — G8929 Other chronic pain: Secondary | ICD-10-CM

## 2020-07-22 ENCOUNTER — Other Ambulatory Visit: Payer: Self-pay

## 2020-07-22 ENCOUNTER — Ambulatory Visit
Admission: RE | Admit: 2020-07-22 | Discharge: 2020-07-22 | Disposition: A | Discharge status: Home / Self Care | Payer: Medicare Other | Source: Ambulatory Visit | Attending: Orthopedic Surgery | Admitting: Orthopedic Surgery

## 2020-07-22 DIAGNOSIS — M5441 Lumbago with sciatica, right side: Secondary | ICD-10-CM | POA: Diagnosis not present

## 2020-07-22 DIAGNOSIS — M4807 Spinal stenosis, lumbosacral region: Secondary | ICD-10-CM | POA: Insufficient documentation

## 2020-07-22 DIAGNOSIS — G8929 Other chronic pain: Secondary | ICD-10-CM | POA: Diagnosis not present

## 2020-07-22 DIAGNOSIS — M48061 Spinal stenosis, lumbar region without neurogenic claudication: Secondary | ICD-10-CM | POA: Diagnosis not present

## 2020-07-27 DIAGNOSIS — M48062 Spinal stenosis, lumbar region with neurogenic claudication: Secondary | ICD-10-CM | POA: Diagnosis not present

## 2020-07-27 DIAGNOSIS — M5416 Radiculopathy, lumbar region: Secondary | ICD-10-CM | POA: Diagnosis not present

## 2020-08-02 ENCOUNTER — Other Ambulatory Visit: Payer: Self-pay | Admitting: Nurse Practitioner

## 2020-08-19 DIAGNOSIS — M48062 Spinal stenosis, lumbar region with neurogenic claudication: Secondary | ICD-10-CM | POA: Diagnosis not present

## 2020-08-19 DIAGNOSIS — M5416 Radiculopathy, lumbar region: Secondary | ICD-10-CM | POA: Diagnosis not present

## 2020-08-19 DIAGNOSIS — M5126 Other intervertebral disc displacement, lumbar region: Secondary | ICD-10-CM | POA: Diagnosis not present

## 2020-08-24 ENCOUNTER — Other Ambulatory Visit: Payer: Self-pay

## 2020-08-24 ENCOUNTER — Ambulatory Visit (INDEPENDENT_AMBULATORY_CARE_PROVIDER_SITE_OTHER): Payer: Medicare Other | Admitting: Nurse Practitioner

## 2020-08-24 ENCOUNTER — Encounter: Payer: Self-pay | Admitting: Nurse Practitioner

## 2020-08-24 VITALS — BP 120/72 | HR 72 | Temp 96.3°F | Ht 60.0 in | Wt 181.0 lb

## 2020-08-24 DIAGNOSIS — F411 Generalized anxiety disorder: Secondary | ICD-10-CM

## 2020-08-24 DIAGNOSIS — E1165 Type 2 diabetes mellitus with hyperglycemia: Secondary | ICD-10-CM | POA: Diagnosis not present

## 2020-08-24 DIAGNOSIS — E039 Hypothyroidism, unspecified: Secondary | ICD-10-CM | POA: Diagnosis not present

## 2020-08-24 DIAGNOSIS — Z6835 Body mass index (BMI) 35.0-35.9, adult: Secondary | ICD-10-CM

## 2020-08-24 DIAGNOSIS — I10 Essential (primary) hypertension: Secondary | ICD-10-CM | POA: Diagnosis not present

## 2020-08-24 DIAGNOSIS — M5136 Other intervertebral disc degeneration, lumbar region: Secondary | ICD-10-CM | POA: Diagnosis not present

## 2020-08-24 LAB — POCT GLYCOSYLATED HEMOGLOBIN (HGB A1C): Hemoglobin A1C: 8.2 % — AB (ref 4.0–5.6)

## 2020-08-24 MED ORDER — METFORMIN HCL 850 MG PO TABS
850.0000 mg | ORAL_TABLET | Freq: Two times a day (BID) | ORAL | 2 refills | Status: DC
Start: 2020-08-24 — End: 2020-10-27

## 2020-08-24 NOTE — Progress Notes (Signed)
Established Patient Office Visit  Subjective:  Patient ID: [name], female    DOB: 02-29-1945  Age: 75 y.o. MRN: [id number]  CC:  Chief Complaint  Patient presents with   Follow-up   Diabetes    HPI [name] presents for routine follow-up.  Patient does have type 2 diabetes.  He currently takes metformin 500 mg twice daily.  Blood sugars have been elevated.  Hemoglobin A1c is 8.2 today.  This is up from 7.2 at last visit.  Of note, patient has seen orthopedic provider since her last visit.  Was diagnosed with multilevel disc bulging and degenerative in her lumbar spine.  She has gotten epidural corticosteroid injection which may be contributing to increase in blood sugars.  Patient states that her back pain improved significantly.  States that she feels good.  Has no symptoms related to elevated blood sugars. Patient's blood pressure is well managed.  She states that she is doing well with sleep and with anxiety.  She has no new concerns or complaints today.  She denies chest pain, chest pressure, or shortness of breath. She denies headaches or visual disturbances. She denies abdominal pain, nausea, vomiting, or changes in bowel or bladder habits.    Past Medical History:  Diagnosis Date   Anxiety    Depression    Diabetes mellitus, type II ([place])    GERD (gastroesophageal reflux disease)    Hypertension    Thyroid disease     Past Surgical History:  Procedure Laterality Date   ABDOMINAL HYSTERECTOMY     BREAST EXCISIONAL BIOPSY Bilateral 80s   benign   BREAST SURGERY     COLONOSCOPY WITH PROPOFOL N/A 04/12/2019   Procedure: COLONOSCOPY WITH PROPOFOL;  Surgeon: Jonathon Bellows, MD;  Location: [facility] ENDOSCOPY;  Service: Gastroenterology;  Laterality: N/A;   JOINT REPLACEMENT Right 2019    Family History  Problem Relation Age of Onset   Anxiety disorder Mother    Colon cancer Mother    Heart disease Father    Anxiety disorder Father    Obesity Sister    Anxiety  disorder Sister    Depression Sister    Osteoporosis Sister    Breast cancer Paternal Aunt     Social History   Socioeconomic History   Marital status: Widowed    Spouse name: Not on file   Number of children: Not on file   Years of education: Not on file   Highest education level: Not on file  Occupational History   Not on file  Tobacco Use   Smoking status: Never   Smokeless tobacco: Never  Vaping Use   Vaping Use: Never used  Substance and Sexual Activity   Alcohol use: No    Alcohol/week: 0.0 standard drinks   Drug use: Never   Sexual activity: Never  Other Topics Concern   Not on file  Social History Narrative   Not on file   Social Determinants of Health   Financial Resource Strain: Not on file  Food Insecurity: Not on file  Transportation Needs: Not on file  Physical Activity: Not on file  Stress: Not on file  Social Connections: Not on file  Intimate Partner Violence: Not on file    Outpatient Medications Prior to Visit  Medication Sig Dispense Refill   Accu-Chek FastClix Lancets MISC 1 each by Does not apply route 2 (two) times daily. Use as directed twice daily diag 111.65 100 each 3   alendronate (FOSAMAX) 35 MG tablet  Take 1 tablet (35 mg total) by mouth every 7 (seven) days. Take with a full glass of water on an empty stomach. 12 tablet 1   aspirin 81 MG tablet Take 1 tablet by mouth daily.     atorvastatin (LIPITOR) 20 MG tablet Take 1 tablet (20 mg total) by mouth daily. 90 tablet 1   busPIRone (BUSPAR) 10 MG tablet Take 1/2 to 1 tablet by mouth twice daily. 180 tablet 1   citalopram (CELEXA) 40 MG tablet Take 1 tablet (40 mg total) by mouth daily. 90 tablet 1   diclofenac Sodium (VOLTAREN) 1 % GEL Apply 4 g topically 4 (four) times daily. 100 g 2   diltiazem (CARDIZEM CD) 240 MG 24 hr capsule TAKE 1 CAPSULE(240 MG) BY MOUTH DAILY 90 capsule 1   FLUAD 0.5 ML SUSY ADM 0.5ML IM UTD  0   glucose blood (ACCU-CHEK GUIDE) test strip Use as directed twice  a day diag e11.65 100 each 3   levothyroxine (SYNTHROID) 50 MCG tablet Take 1 tablet (50 mcg total) by mouth every morning. 90 tablet 1   lisinopril (ZESTRIL) 20 MG tablet TAKE 1.5 tablets po QD 135 tablet 3   meloxicam (MOBIC) 15 MG tablet Take 15 mg by mouth daily.     metoprolol tartrate (LOPRESSOR) 25 MG tablet Take 1 tablet (25 mg total) by mouth 2 (two) times daily. 180 tablet 1   pantoprazole (PROTONIX) 40 MG tablet Take 1 tablet (40 mg total) by mouth 2 (two) times daily. 180 tablet 1   promethazine (PHENERGAN) 25 MG tablet TAKE 1 TABLET BY MOUTH TWICE A DAY AS NEEDED FOR NAUSEA OR VOMITING 30 tablet 3   QUEtiapine (SEROQUEL) 50 MG tablet Take 1 tablet (50 mg total) by mouth 3 (three) times daily. May take 1 tablet in afternoons if needed for severe anxiety. 270 tablet 1   rOPINIRole (REQUIP) 0.25 MG tablet Take 1 tablet (0.25 mg total) by mouth 2 times daily at 12 noon and 4 pm. 180 tablet 1   sucralfate (CARAFATE) 1 g tablet TAKE 1 TABLET BY MOUTH FOUR TIMES A DAY IF NEEDED 360 tablet 1   tiZANidine (ZANAFLEX) 2 MG tablet Take 1 tablet (2 mg total) by mouth 2 (two) times daily as needed for muscle spasms. 45 tablet 1   zolpidem (AMBIEN) 10 MG tablet Take 1 tablet (10 mg total) by mouth at bedtime as needed for sleep. 90 tablet 0   Zoster Vaccine Adjuvanted [facility]) injection Shingles vaccine series - inject IM as directed . 0.5 mL 1   No facility-administered medications prior to visit.    No Known Allergies  ROS Review of Systems  Constitutional:  Negative for activity change, chills, fatigue and fever.  HENT:  Negative for postnasal drip, rhinorrhea, sinus pressure and sinus pain.   Eyes: Negative.   Respiratory:  Negative for chest tightness, shortness of breath and wheezing.   Cardiovascular:  Negative for chest pain and palpitations.  Gastrointestinal:  Negative for constipation, diarrhea, nausea and vomiting.  Endocrine: Negative for cold intolerance, heat intolerance,  polydipsia and polyuria.       Blood sugars have recently been elevated.  Her hemoglobin A1c is 8.2 today, up from 7.1 at her most recent visit.  Genitourinary: Negative.   Musculoskeletal:  Positive for back pain and myalgias.       Patient reports improved back pain.  Has seen orthopedic provider.  Did get epidural corticosteroid injection improved her pain  Skin:  Negative for  rash.  Allergic/Immunologic: Negative.   Neurological:  Negative for dizziness, weakness and headaches.  Psychiatric/Behavioral:  Positive for dysphoric mood and sleep disturbance. The patient is nervous/anxious.        Patient states anxiety improved movement.  States she does have her days and nights mixed up.  Sleeping better when she does sleep.     Objective:    Physical Exam Vitals and nursing note reviewed.  Constitutional:      Appearance: Normal appearance. She is well-developed. She is obese.  HENT:     Head: Normocephalic and atraumatic.     Nose: Nose normal.     Mouth/Throat:     Mouth: Mucous membranes are moist.  Eyes:     Extraocular Movements: Extraocular movements intact.     Conjunctiva/sclera: Conjunctivae normal.     Pupils: Pupils are equal, round, and reactive to light.  Cardiovascular:     Rate and Rhythm: Normal rate and regular rhythm.     Pulses: Normal pulses.     Heart sounds: Normal heart sounds.  Pulmonary:     Effort: Pulmonary effort is normal.     Breath sounds: Normal breath sounds.  Abdominal:     Palpations: Abdomen is soft.  Musculoskeletal:        General: Normal range of motion.     Cervical back: Normal range of motion and neck supple.  Lymphadenopathy:     Cervical: No cervical adenopathy.  Skin:    General: Skin is warm and dry.     Capillary Refill: Capillary refill takes less than 2 seconds.  Neurological:     General: No focal deficit present.     Mental Status: She is alert and oriented to person, place, and time. Mental status is at baseline.   Psychiatric:        Mood and Affect: Mood normal.        Behavior: Behavior normal.        Thought Content: Thought content normal.        Judgment: Judgment normal.    Today's Vitals   08/24/20 1026  BP: 120/72  Pulse: 72  Temp: (!) 96.3 F (35.7 C)  SpO2: 94%  Weight: 181 lb 0.6 oz (82.1 kg)  Height: 5' (1.524 m)   Body mass index is 35.36 kg/m.   Wt Readings from Last 3 Encounters:  08/24/20 181 lb 0.6 oz (82.1 kg)  06/29/20 181 lb (82.1 kg)  05/25/20 181 lb 8 oz (82.3 kg)     Health Maintenance Due  Topic Date Due   TETANUS/TDAP  Never done    There are no preventive care reminders to display for this patient.  Lab Results  Component Value Date   TSH 0.918 12/27/2019   Lab Results  Component Value Date   WBC 12.0 (H) 12/27/2019   HGB 12.7 12/27/2019   HCT 38.7 12/27/2019   MCV 91 12/27/2019   PLT 269 12/27/2019   Lab Results  Component Value Date   NA 137 12/27/2019   K 4.7 12/27/2019   CO2 21 12/27/2019   GLUCOSE 163 (H) 12/27/2019   BUN 8 12/27/2019   CREATININE 0.77 12/27/2019   BILITOT 0.5 12/27/2019   ALKPHOS 63 12/27/2019   AST 21 12/27/2019   ALT 30 12/27/2019   PROT 6.2 12/27/2019   ALBUMIN 4.5 12/27/2019   CALCIUM 10.3 12/27/2019   ANIONGAP 12 04/09/2018   Lab Results  Component Value Date   CHOL 121 12/27/2019   Lab Results  Component Value Date   HDL 42 12/27/2019   Lab Results  Component Value Date   LDLCALC 58 12/27/2019   Lab Results  Component Value Date   TRIG 115 12/27/2019   Lab Results  Component Value Date   CHOLHDL 2.9 12/30/2015   Lab Results  Component Value Date   HGBA1C 8.2 (A) 08/24/2020      Assessment & Plan:  1. Type 2 diabetes mellitus with hyperglycemia, without long-term current use of insulin (HCC) Hemoglobin A1c is 8.2 today.  Will increase metformin dose to 850 mg twice daily.  Advised patient to monitor blood sugars a bit more closely.  The goal is to have her blood sugars consistently  under 150.  Prescription sent for new blood glucose meter and supplies so she can monitor her blood sugars once a day and as needed - POCT glycosylated hemoglobin (Hb A1C) - metFORMIN (GLUCOPHAGE) 850 MG tablet; Take 1 tablet (850 mg total) by mouth 2 (two) times daily with a meal.  Dispense: 60 tablet; Refill: 2 - blood glucose meter kit and supplies; Dispense based on patient and insurance preference. Use up to four times daily as directed. (FOR ICD-10 E10.9, E11.9).  Dispense: 1 each; Refill: 2  2. Essential hypertension, benign Stable.  Patient as prescribed.  3. Acquired hypothyroidism Check thyroid panel at next visit and adjust levothyroxine dosing as indicated.  4. Anxiety, generalized Currently well managed.  No changes in medications.  5. 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  She will follow-up as needed and as scheduled.  6. Body mass index (BMI) of 35.0-35.9 in adult Encouraged to limit intake of carbohydrates and foods high in sugar.  This will help lower blood sugar and have her to lose weight.  She should gradually incorporate physical activity into her daily routine.  We will monitor her weight.  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    Dispense:  60 tablet    Refill:  2    Please not e change in metformin dosing    Order Specific Question:   Supervising Provider    Answer:   Beatrice Lecher D [[id number]]   blood glucose meter kit and supplies    Sig: Dispense based on patient and insurance preference. Use up to four times daily as directed. (FOR ICD-10 E10.9, E11.9).    Dispense:  1 each    Refill:  2    Order Specific Question:   Supervising Provider    Answer:   Beatrice Lecher D [[id number]]    Order Specific Question:   Number of strips    Answer:  100    Order Specific Question:   Number of lancets    Answer:   100    Follow-up: Return in about 4 months (around 12/25/2020) for [place], diabetes with HgbA1c check - FBW with Free T4 at time of visit. Marland Kitchen   Ronnell Freshwater, NP

## 2020-08-25 ENCOUNTER — Other Ambulatory Visit: Payer: Self-pay | Admitting: Nurse Practitioner

## 2020-08-25 DIAGNOSIS — M5136 Other intervertebral disc degeneration, lumbar region: Secondary | ICD-10-CM | POA: Insufficient documentation

## 2020-08-25 DIAGNOSIS — E1165 Type 2 diabetes mellitus with hyperglycemia: Secondary | ICD-10-CM

## 2020-08-25 MED ORDER — LANCETS 30G MISC: 1 refills | Status: DC

## 2020-08-25 MED ORDER — BLOOD GLUCOSE METER KIT: PACK | 2 refills | Status: DC

## 2020-08-25 MED ORDER — GLUCOSE BLOOD VI STRP: ORAL_STRIP | 12 refills | Status: DC

## 2020-08-25 MED ORDER — FREESTYLE SYSTEM KIT: PACK | 0 refills | Status: DC

## 2020-08-25 NOTE — Progress Notes (Signed)
Separate orders for glucose meter, test strips, and lancets were sent to [facility] to be filled according to insurance preference

## 2020-08-25 NOTE — Telephone Encounter (Signed)
Separate orders for glucose meter, test strips, and lancets were sent to [facility] to be filled according to insurance preference

## 2020-08-25 NOTE — Patient Instructions (Signed)
Plan de alimentacin restringido en grasas y colesterol Fat and Cholesterol Restricted Eating Plan El exceso de grasas y colesterol en la dieta puede causar problemas de [place]. Elegir los alimentos adecuados ayuda a Theatre manager normales los niveles de grasasy colesterol. Esto puede evitarle contraer ciertas enfermedades. El mdico puede recomendarle un plan de alimentacin que incluya lo siguiente: Grasas totales: ______% o menos del total de caloras por da. Grasas saturadas: ______% o menos del total de caloras por da. Colesterol: menos de _________mg Darlin Coco. Fibra: ______g Darlin Coco. Consejos para seguir Science writer de las comidas En las comidas, [place] su plato en cuatro partes iguales: Llene la mitad del plato con verduras y ensaladas de hojas verdes. Llene un cuarto del plato con cereales integrales. Llene un cuarto del plato con alimentos con protenas con bajo contenido de grasas [employer]). Coma pescado con alto contenido de grasas omega3 al [employer] veces por semana. Esas grasas se encuentran en la caballa, el atn, las sardinas y el salmn. Coma alimentos con alto contenido de [place], como cereales [place], frijoles, [place], [place], [place], guisantes y [place]. Consejos generales  Si necesita adelgazar, consulte a su mdico. Evite lo siguiente: Alimentos con Holiday representative. Comidas fritas. Alimentos con aceites parcialmente hidrogenados. Limite el consumo de alcohol a no ms de 51medida por da si es mujer y no est [place], y a 70medidas por da si es hombre. Una medida equivale a 12oz de Chartered certified accountant, 5oz de vino o 1oz de bebidas alcohlicas de alta graduacin.  Lea las etiquetas de los alimentos Lea las etiquetas de los alimentos para Civil engineer, contracting lo siguiente: Si contienen grasas trans. Si contienen aceites parcialmente hidrogenados. La cantidad de grasas saturadas (g) que contiene cada porcin. La cantidad de colesterol (mg) que contiene cada  porcin. La cantidad de fibra (g) que contiene cada porcin. Elija alimentos con grasas saludables, tales como las siguientes: Grasas monoinsaturadas. Grasas poliinsaturadas. Grasas omega3. Elija productos de cereal que tengan cereales integrales. Busque la palabra "integral" en Equities trader de la lista de ingredientes. Al cocinar Emplee mtodos de coccin con poca cantidad de grasa. Por ejemplo, hornear, hervir, grillar y Holiday representative. Coma ms comidas caseras. Coma en restaurantes y bares con menos frecuencia. Evite cocinar usando grasas saturadas, como [place], crema, aceite de [place], aceite de palmiste y aceite de [place]. Alpine recomendados  Frutas Frutas frescas, en conserva (en su jugo natural) o frutas congeladas. Verduras Verduras frescas o congeladas (crudas, al vapor, asadas o grilladas). Ensaladas de hojas verdes. Granos Cereales integrales, como panes, galletas, cereales y pastas de integrales o de trigo integral. Avena sin endulzar, trigo bulgur, cebada, quinua o arroz integral. Tortillas de harina de maz o trigo integral. Carnes y otros alimentos ricos en protenas Carne de res molida (al 85% o ms magra), carne de res de animales alimentados con pastos o carne de res sin la grasa. Pollo o pavo sin piel. Carne de pollo o de [place]. Cerdo sin la grasa. Todos los pescados y frutos de mar. Claras de huevo. Porotos, guisantes o lentejas secos. Frutos secos o semillas sin sal. Frijoles enlatados sin sal. Mantequillas de frutos secos sin azcar ni aceite agregados. Lcteos Productos lcteos descremados o semidescremados, como [employer] o al 1%, quesos reducidos en grasas o al 2%, queso cottage o ricota con bajo contenido de [place] o sin contenido de [place], o yogur natural descremado o semidescremado. Grasas y aceites Margarina untable que no contenga grasas trans. [place] y condimentos para ensaladas livianos o reducidos en  grasas. [place]. Aceites de oliva, canola,  ssamo o crtamo. Es posible que los productos que se enumeran ms [place] no constituyan una lista completa de los alimentos y las bebidas que puede tomar. Consulte a unnutricionista para obtener ms informacin. Alimentos a Ambulance person Fruta enlatada en almbar espeso. Frutas con salsa de crema o mantequilla. Frutas cocidas en aceite. Verduras Verduras cocinadas con salsas de queso, crema o mantequilla. Verduras fritas. Granos Pan blanco. Pastas blancas. Arroz blanco. Pan de maz. Bagels, pasteles y croissants. Galletas saladas y colaciones que contengan grasas trans y aceites hidrogenados. Carnes y otros alimentos ricos en protenas [place] de carne con alto contenido de [place]. Costillas, alas de pollo, tocineta, salchicha, mortadela, salame, chinchulines, tocino, perros calientes, salchichas alemanas y embutidos envasados. Hgado y otros rganos. Huevos enteros y yemas de [place]. Pollo y pavo con piel. Carne frita. Lcteos Leche entera o al 2%, crema, mezcla de [place] y crema, y queso crema. Quesos enteros. Yogur entero o endulzado. Quesos con toda su grasa. Cremas no lcteas y coberturas batidas. Quesos procesados, quesos para untar y [place]. Bebidas Alcohol. Bebidas endulzadas con azcar, como refrescos, limonada y bebidas frutales. Grasas y aceites [place], [place] en barra, [place] de [place], [place], [place] clarificada o grasa de tocino. Aceites de coco, de palmiste y de palma. Dulces y postres Jarabe de maz, azcares, miel y Control and instrumentation engineer. Caramelos. Mermeladas y [place]. Chrissie Noa. Cereales endulzados. Galletas, pasteles, bizcochuelos, donas, muffins y helado. Es posible que los productos que se enumeran ms [place] no constituyan una lista completa de los alimentos y las bebidas que Nurse, adult. Consulte a unnutricionista para obtener ms informacin. Resumen Elegir los alimentos adecuados ayuda a Theatre manager normales los niveles de grasas y [place]. Esto puede evitarle contraer  ciertas enfermedades. En las comidas, llene la mitad del plato con verduras y ensaladas de hojas verdes. Coma alimentos con alto contenido de [place], como cereales [place], frijoles, [place], zanahorias, guisantes y [place]. Limite los alimentos con azcar agregada y grasas saturadas, el alcohol y las comidas fritas. Esta informacin no tiene Marine scientist el consejo del mdico. Asegresede hacerle al mdico cualquier pregunta que tenga. Document Revised: 08/02/2019 Document Reviewed: 08/02/2019 Elsevier Patient Education  [place].

## 2020-09-10 DIAGNOSIS — H2513 Age-related nuclear cataract, bilateral: Secondary | ICD-10-CM | POA: Diagnosis not present

## 2020-09-29 ENCOUNTER — Telehealth: Payer: Self-pay | Admitting: Nurse Practitioner

## 2020-09-29 ENCOUNTER — Encounter: Payer: Self-pay | Admitting: Ophthalmology

## 2020-09-29 ENCOUNTER — Other Ambulatory Visit: Payer: Self-pay | Admitting: Nurse Practitioner

## 2020-09-29 DIAGNOSIS — I10 Essential (primary) hypertension: Secondary | ICD-10-CM

## 2020-09-29 MED ORDER — LISINOPRIL 20 MG PO TABS: ORAL_TABLET | ORAL | 3 refills | Status: DC

## 2020-09-29 NOTE — Telephone Encounter (Signed)
Pharmacy states they have not received the prescription. Thanks

## 2020-09-29 NOTE — Progress Notes (Signed)
Sent new [facility]ril '30mg[facility] [place] Initial change had been made with new prescription sent in 06/2020 with #135 with three refills  I resent today.

## 2020-09-29 NOTE — Telephone Encounter (Signed)
Sent new prescription [facility]opril '30mg'$  [facility] [place] Initial change had been made with new prescription sent in 06/2020 with #135 with three refills  I resent today.

## 2020-09-29 NOTE — Telephone Encounter (Signed)
Pharmacy is requesting a 30 mg tablet of lisinopril according to daughter. The pharmacy has not heard anything. [facility] [address] [facility] [place], thanks.

## 2020-10-01 ENCOUNTER — Telehealth: Payer: Self-pay | Admitting: Nurse Practitioner

## 2020-10-01 ENCOUNTER — Other Ambulatory Visit: Payer: Self-pay

## 2020-10-01 DIAGNOSIS — M810 Age-related osteoporosis without current pathological fracture: Secondary | ICD-10-CM

## 2020-10-01 DIAGNOSIS — H2511 Age-related nuclear cataract, right eye: Secondary | ICD-10-CM | POA: Diagnosis not present

## 2020-10-01 DIAGNOSIS — I10 Essential (primary) hypertension: Secondary | ICD-10-CM

## 2020-10-01 MED ORDER — LISINOPRIL 30 MG PO TABS: ORAL_TABLET | ORAL | 1 refills | Status: DC

## 2020-10-01 NOTE — Telephone Encounter (Signed)
Lisinopril '30mg'$  [facility].

## 2020-10-01 NOTE — Telephone Encounter (Signed)
Patient would like 30 mg one tablet called lisinopril and [facility] [place]. Patient states former prescription was called in wrong. Thanks

## 2020-10-05 ENCOUNTER — Other Ambulatory Visit: Payer: Self-pay | Admitting: Nurse Practitioner

## 2020-10-05 DIAGNOSIS — G4709 Other insomnia: Secondary | ICD-10-CM

## 2020-10-05 MED ORDER — ZOLPIDEM TARTRATE 10 MG PO TABS: 10.0000 mg | ORAL_TABLET | Freq: Every evening | ORAL | 0 refills | Status: DC | PRN

## 2020-10-05 NOTE — Progress Notes (Signed)
Refilled ambien with single, 90 day prescription. Will discuss further refills at patient's next visit. She will need to sign controlled substances agreement at her next visit.

## 2020-10-14 ENCOUNTER — Ambulatory Visit
Admission: RE | Admit: 2020-10-14 | Discharge: 2020-10-14 | Disposition: A | Discharge status: Home / Self Care | Payer: Medicare Other | Attending: Ophthalmology | Admitting: Ophthalmology

## 2020-10-14 ENCOUNTER — Ambulatory Visit: Payer: Medicare Other | Admitting: Anesthesiology

## 2020-10-14 ENCOUNTER — Encounter
Admission: RE | Disposition: A | Discharge status: Home / Self Care | Payer: Self-pay | Source: Home / Self Care | Attending: Ophthalmology

## 2020-10-14 ENCOUNTER — Encounter: Payer: Self-pay | Admitting: Ophthalmology

## 2020-10-14 ENCOUNTER — Other Ambulatory Visit: Payer: Self-pay

## 2020-10-14 DIAGNOSIS — Z7983 Long term (current) use of bisphosphonates: Secondary | ICD-10-CM | POA: Diagnosis not present

## 2020-10-14 DIAGNOSIS — Z791 Long term (current) use of non-steroidal anti-inflammatories (NSAID): Secondary | ICD-10-CM | POA: Diagnosis not present

## 2020-10-14 DIAGNOSIS — Z79890 Hormone replacement therapy (postmenopausal): Secondary | ICD-10-CM | POA: Diagnosis not present

## 2020-10-14 DIAGNOSIS — Z7984 Long term (current) use of oral hypoglycemic drugs: Secondary | ICD-10-CM | POA: Insufficient documentation

## 2020-10-14 DIAGNOSIS — H2511 Age-related nuclear cataract, right eye: Secondary | ICD-10-CM | POA: Diagnosis not present

## 2020-10-14 DIAGNOSIS — E1136 Type 2 diabetes mellitus with diabetic cataract: Secondary | ICD-10-CM | POA: Diagnosis not present

## 2020-10-14 DIAGNOSIS — Z79899 Other long term (current) drug therapy: Secondary | ICD-10-CM | POA: Insufficient documentation

## 2020-10-14 DIAGNOSIS — H25811 Combined forms of age-related cataract, right eye: Secondary | ICD-10-CM | POA: Diagnosis not present

## 2020-10-14 DIAGNOSIS — Z7982 Long term (current) use of aspirin: Secondary | ICD-10-CM | POA: Diagnosis not present

## 2020-10-14 HISTORY — PX: CATARACT EXTRACTION W/PHACO: SHX586

## 2020-10-14 LAB — GLUCOSE, CAPILLARY
Glucose-Capillary: 185 mg/dL — ABNORMAL HIGH (ref 70–99)
Glucose-Capillary: 150 mg/dL — ABNORMAL HIGH (ref 70–99)

## 2020-10-14 SURGERY — PHACOEMULSIFICATION, CATARACT, WITH IOL INSERTION
Anesthesia: Monitor Anesthesia Care | Site: Eye | Laterality: Right

## 2020-10-14 MED ORDER — SIGHTPATH DOSE#1 BSS IO SOLN
INTRAOCULAR | Status: DC | PRN
  Administered 2020-10-14: 66 mL via OPHTHALMIC

## 2020-10-14 MED ORDER — SIGHTPATH DOSE#1 NA HYALUR & NA CHOND-NA HYALUR IO KIT
PACK | INTRAOCULAR | Status: DC | PRN
  Administered 2020-10-14: 1 via OPHTHALMIC

## 2020-10-14 MED ORDER — MIDAZOLAM HCL 2 MG/2ML IJ SOLN
INTRAMUSCULAR | Status: DC | PRN
  Administered 2020-10-14: 1 mg via INTRAVENOUS

## 2020-10-14 MED ORDER — TETRACAINE HCL 0.5 % OP SOLN
1.0000 [drp] | OPHTHALMIC | Status: DC | PRN
  Administered 2020-10-14 (×3): 1 [drp] via OPHTHALMIC

## 2020-10-14 MED ORDER — ACETAMINOPHEN 160 MG/5ML PO SOLN: 325.0000 mg | ORAL | Status: DC | PRN

## 2020-10-14 MED ORDER — SIGHTPATH DOSE#1 BSS IO SOLN
INTRAOCULAR | Status: DC | PRN
  Administered 2020-10-14: 15 mL via INTRAOCULAR

## 2020-10-14 MED ORDER — ACETAMINOPHEN 325 MG PO TABS: 325.0000 mg | ORAL_TABLET | ORAL | Status: DC | PRN

## 2020-10-14 MED ORDER — CYCLOPENTOLATE HCL 2 % OP SOLN
1.0000 [drp] | OPHTHALMIC | Status: AC | PRN
  Administered 2020-10-14 (×3): 1 [drp] via OPHTHALMIC

## 2020-10-14 MED ORDER — SIGHTPATH DOSE#1 BSS IO SOLN
INTRAOCULAR | Status: DC | PRN
  Administered 2020-10-14: 2 mL

## 2020-10-14 MED ORDER — PHENYLEPHRINE HCL 10 % OP SOLN
1.0000 [drp] | OPHTHALMIC | Status: AC | PRN
  Administered 2020-10-14 (×3): 1 [drp] via OPHTHALMIC

## 2020-10-14 MED ORDER — ONDANSETRON HCL 4 MG/2ML IJ SOLN: 4.0000 mg | Freq: Once | INTRAMUSCULAR | Status: DC | PRN

## 2020-10-14 MED ORDER — BRIMONIDINE TARTRATE-TIMOLOL 0.2-0.5 % OP SOLN
OPHTHALMIC | Status: DC | PRN
  Administered 2020-10-14: 1 [drp] via OPHTHALMIC

## 2020-10-14 MED ORDER — FENTANYL CITRATE (PF) 100 MCG/2ML IJ SOLN
INTRAMUSCULAR | Status: DC | PRN
  Administered 2020-10-14: 50 ug via INTRAVENOUS

## 2020-10-14 MED ORDER — CEFUROXIME OPHTHALMIC INJECTION 1 MG/0.1 ML
INJECTION | OPHTHALMIC | Status: DC | PRN
  Administered 2020-10-14: 0.1 mL via INTRACAMERAL

## 2020-10-14 MED ORDER — LACTATED RINGERS IV SOLN: INTRAVENOUS | Status: DC

## 2020-10-14 SURGICAL SUPPLY — 16 items
CANNULA ANT/CHMB 27GA (MISCELLANEOUS) ×2 IMPLANT
GLOVE SRG 8 PF TXTR STRL LF DI (GLOVE) ×1 IMPLANT
GLOVE SURG ENC TEXT LTX SZ7.5 (GLOVE) ×2 IMPLANT
GLOVE SURG UNDER POLY LF SZ8 (GLOVE) ×2
GOWN STRL REUS W/ TWL LRG LVL3 (GOWN DISPOSABLE) ×2 IMPLANT
GOWN STRL REUS W/TWL LRG LVL3 (GOWN DISPOSABLE) ×4
LENS IOL TECNIS EYHANCE 25.0 (Intraocular Lens) ×2 IMPLANT
MARKER SKIN DUAL TIP RULER LAB (MISCELLANEOUS) ×2 IMPLANT
NEEDLE CAPSULORHEX 25GA (NEEDLE) ×2 IMPLANT
NEEDLE FILTER BLUNT 18X 1/2SAF (NEEDLE) ×2
NEEDLE FILTER BLUNT 18X1 1/2 (NEEDLE) ×2 IMPLANT
PACK EYE AFTER SURG (MISCELLANEOUS) ×2 IMPLANT
SYR 3ML LL SCALE MARK (SYRINGE) ×4 IMPLANT
SYR TB 1ML LUER SLIP (SYRINGE) ×2 IMPLANT
WATER STERILE IRR 250ML POUR (IV SOLUTION) ×2 IMPLANT
WIPE NON LINTING 3.25X3.25 (MISCELLANEOUS) ×2 IMPLANT

## 2020-10-14 NOTE — Anesthesia Preprocedure Evaluation (Signed)
Anesthesia Evaluation  Patient identified by MRN, date of birth, ID band Patient awake    Reviewed: Allergy & Precautions, NPO status , Patient's Chart, lab work & pertinent test results  History of Anesthesia Complications Negative for: history of anesthetic complications  Airway Mallampati: II  TM Distance: >3 FB Neck ROM: Full    Dental no notable dental hx. (+) Teeth Intact   Pulmonary neg pulmonary ROS, neg sleep apnea, neg COPD, Patient abstained from smoking.Not current smoker,    Pulmonary exam normal breath sounds clear to auscultation       Cardiovascular Exercise Tolerance: Good METShypertension, Pt. on medications (-) CAD and (-) Past MI (-) dysrhythmias + Valvular Problems/Murmurs MVP  Rhythm:Regular Rate:Normal + Systolic murmurs    Neuro/Psych PSYCHIATRIC DISORDERS Anxiety Depression  Neuromuscular disease negative psych ROS   GI/Hepatic GERD  Controlled,(+)     (-) substance abuse  ,   Endo/Other  diabetes, Type 2, Oral Hypoglycemic AgentsHypothyroidism   Renal/GU Renal disease     Musculoskeletal  (+) Arthritis ,   Abdominal   Peds  Hematology   Anesthesia Other Findings Past Medical History: No date: Anxiety No date: Depression No date: Diabetes mellitus, type II (HCC) No date: GERD (gastroesophageal reflux disease) No date: Hypertension No date: Thyroid disease  Reproductive/Obstetrics                             Anesthesia Physical  Anesthesia Plan  ASA: 3  Anesthesia Plan: MAC   Post-op Pain Management:    Induction: Intravenous  PONV Risk Score and Plan: 2 and TIVA, Treatment may vary due to age or medical condition and Midazolam  Airway Management Planned: Nasal Cannula and Natural Airway  Additional Equipment: None  Intra-op Plan:   Post-operative Plan:   Informed Consent: I have reviewed the patients History and Physical, chart, labs and  discussed the procedure including the risks, benefits and alternatives for the proposed anesthesia with the patient or authorized representative who has indicated his/her understanding and acceptance.     Dental advisory given  Plan Discussed with: CRNA and Surgeon  Anesthesia Plan Comments: (Discussed risks of anesthesia with patient, including possibility of difficulty with spontaneous ventilation under anesthesia necessitating airway intervention, PONV, and rare risks such as cardiac or respiratory or neurological events. Patient understands.)        Anesthesia Quick Evaluation Patient Active Problem List   Diagnosis Date Noted  . 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 Encounter to establish care 05/25/2020  . Mixed hyperlipidemia 05/25/2020  . Need for shingles vaccine 01/26/2020  . Age-related cataract 12/29/2019  . Mitral valve prolapse 03/25/2019  . Gastroesophageal reflux disease without esophagitis 03/25/2019  . Restless leg syndrome 10/05/2018  . Acute eczematoid otitis externa of left ear 08/21/2018  . Age related osteoporosis 08/21/2018  . Vaginal yeast infection 06/18/2018  . Encounter for general adult medical examination with abnormal findings 12/30/2017  . Acute low back pain with sciatica 12/30/2017  . Fatigue due to sleep pattern disturbance 12/30/2017  . Ovarian failure 09/27/2017  . 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 Essential hypertension, benign 08/05/2017  . Type 2 diabetes mellitus with hyperglycemia, without long-term current use of insulin ([place]) 08/05/2017  . Hypothyroidism 08/05/2017  . Depression, major, single episode, severe ([place]) 08/05/2017  . Need for vaccination against Streptococcus pneumoniae using pneumococcal conjugate vaccine 13 08/05/2017  .  Body mass index (BMI) of 35.0-35.9 in adult 04/07/2017  . Acute ischemic colitis ([place]) 09/12/2016  . Nausea  and vomiting 09/12/2016  . Acute urinary retention 09/12/2016  . Acute renal insufficiency 09/12/2016  . Colitis 09/12/2016  . 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  . Primary osteoarthritis of right knee 07/28/2016  . Depression, major, recurrent, moderate ([place]) 07/22/2014  . Anxiety, generalized 07/22/2014  . Other insomnia 07/22/2014  . Panic disorder without agoraphobia 07/22/2014  . H/O: obesity 06/01/2014  . H/O: HTN (hypertension) 06/01/2014  . H/O gastrointestinal disease 06/01/2014  . H/O diabetes mellitus 06/01/2014  . H/O elevated lipids 06/01/2014  . History of hay fever 06/01/2014    CBC Latest Ref Rng & Units 12/27/2019 12/26/2018 04/09/2018  WBC 3.4 - 10.8 x10E3/uL 12.0(H) 9.0 9.2  Hemoglobin 11.1 - 15.9 g/dL 12.7 12.4 12.5  Hematocrit 34.0 - 46.6 % 38.7 36.3 38.3  Platelets 150 - 450 x10E3/uL 269 302 279   BMP Latest Ref Rng & Units 12/27/2019 12/26/2018 04/09/2018  Glucose 65 - 99 mg/dL 163(H) 168(H) 225(H)  BUN 8 - 27 mg/dL 8 6(L) 7(L)  Creatinine 0.57 - 1.00 mg/dL 0.77 0.77 0.59  BUN/Creat Ratio 12 - 28 10(L) 8(L) -  Sodium 134 - 144 mmol/L 137 136 136  Potassium 3.5 - 5.2 mmol/L 4.7 4.8 3.3(L)  Chloride 96 - 106 mmol/L 100 97 102  CO2 20 - 29 mmol/L _0 Calcium 8.7 - 10.3 mg/dL 10.3 10.0 9.8    Risks and benefits of anesthesia discussed at length, patient or surrogate demonstrates understanding. Appropriately NPO. Plan to proceed with anesthesia.  Champ Mungo, MD 10/14/20

## 2020-10-14 NOTE — H&P (Signed)
[facility]   Primary Care Physician:  Ronnell Freshwater, NP Ophthalmologist: Dr. Leandrew Koyanagi  Pre-Procedure History & Physical: HPI:  Margaretmary Prisk Vancamp is a 75 y.o. female here for ophthalmic surgery.   Past Medical History:  Diagnosis Date   Anxiety    Depression    Diabetes mellitus, type II ([place])    GERD (gastroesophageal reflux disease)    Hypertension    Thyroid disease     Past Surgical History:  Procedure Laterality Date   ABDOMINAL HYSTERECTOMY     BREAST EXCISIONAL BIOPSY Bilateral 80s   benign   COLONOSCOPY WITH PROPOFOL N/A 04/12/2019   Procedure: COLONOSCOPY WITH PROPOFOL;  Surgeon: Jonathon Bellows, MD;  Location: [facility] ENDOSCOPY;  Service: Gastroenterology;  Laterality: N/A;   JOINT REPLACEMENT Right 2019    Prior to Admission medications   Medication Sig Start Date End Date Taking? Authorizing Provider  alendronate (FOSAMAX) 35 MG tablet Take 1 tablet (35 mg total) by mouth every 7 (seven) days. Take with a full glass of water on an empty stomach. 05/25/20  Yes Ronnell Freshwater, NP  aspirin 81 MG tablet Take 1 tablet by mouth daily.   Yes [provider]  atorvastatin (LIPITOR) 20 MG tablet Take 1 tablet (20 mg total) by mouth daily. 05/25/20 11/21/20 Yes Boscia, Heather E, NP  busPIRone (BUSPAR) 10 MG tablet Take 1/2 to 1 tablet by mouth twice daily. 05/25/20  Yes Boscia, Greer Ee, NP  citalopram (CELEXA) 40 MG tablet Take 1 tablet (40 mg total) by mouth daily. 05/25/20  Yes Boscia, Greer Ee, NP  diclofenac Sodium (VOLTAREN) 1 % GEL Apply 4 g topically 4 (four) times daily. 05/25/20  Yes Boscia, Greer Ee, NP  diltiazem (CARDIZEM CD) 240 MG 24 hr capsule TAKE 1 CAPSULE(240 MG) BY MOUTH DAILY 05/25/20  Yes Boscia, Heather E, NP  FLUAD 0.5 ML SUSY ADM 0.5ML IM UTD 10/20/17  Yes [provider]  glucose blood test strip Blood glucose testing done daily and as needed 08/25/20  Yes Boscia, Heather E, NP  glucose monitoring kit (FREESTYLE)  monitoring kit Blood sugar testing to be done daily and as needed 08/25/20  Yes Boscia, Nira Conn E, NP  Lancets 30G MISC Blood glucose testing to be done daily ane as needed 08/25/20  Yes Boscia, Heather E, NP  levothyroxine (SYNTHROID) 50 MCG tablet Take 1 tablet (50 mcg total) by mouth every morning. 05/25/20  Yes Boscia, Heather E, NP  lisinopril (ZESTRIL) 30 MG tablet TAKE 1 TABLET BY MOUTH ONCE A DAY 10/01/20  Yes Boscia, Heather E, NP  meloxicam (MOBIC) 15 MG tablet Take 15 mg by mouth daily.   Yes [provider]  metFORMIN (GLUCOPHAGE) 850 MG tablet Take 1 tablet (850 mg total) by mouth 2 (two) times daily with a meal. 08/24/20  Yes Boscia, Heather E, NP  metoprolol tartrate (LOPRESSOR) 25 MG tablet Take 1 tablet (25 mg total) by mouth 2 (two) times daily. 05/25/20  Yes Boscia, Heather E, NP  pantoprazole (PROTONIX) 40 MG tablet Take 1 tablet (40 mg total) by mouth 2 (two) times daily. 05/25/20  Yes Boscia, Heather E, NP  promethazine (PHENERGAN) 25 MG tablet TAKE 1 TABLET BY MOUTH TWICE A DAY AS NEEDED FOR NAUSEA OR VOMITING 08/03/20  Yes Boscia, Heather E, NP  QUEtiapine (SEROQUEL) 50 MG tablet Take 1 tablet (50 mg total) by mouth 3 (three) times daily. May take 1 tablet in afternoons if needed for severe anxiety. 06/29/20  Yes Boscia,  Greer Ee, NP  rOPINIRole (REQUIP) 0.25 MG tablet Take 1 tablet (0.25 mg total) by mouth 2 times daily at 12 noon and 4 pm. 05/25/20  Yes Boscia, Heather E, NP  sucralfate (CARAFATE) 1 g tablet TAKE 1 TABLET BY MOUTH FOUR TIMES A DAY IF NEEDED 05/25/20  Yes Boscia, Heather E, NP  tiZANidine (ZANAFLEX) 2 MG tablet Take 1 tablet (2 mg total) by mouth 2 (two) times daily as needed for muscle spasms. 02/26/20  Yes Luiz Ochoa, NP  traMADol (ULTRAM) 50 MG tablet Take by mouth every 6 (six) hours as needed.   Yes [provider]  zolpidem (AMBIEN) 10 MG tablet Take 1 tablet (10 mg total) by mouth at bedtime as needed for sleep. 10/05/20  Yes Ronnell Freshwater, NP  Zoster Vaccine Adjuvanted [facility]) injection Shingles vaccine series - inject IM as directed . 01/06/20  Yes Ronnell Freshwater, NP    Allergies as of 09/17/2020   (No Known Allergies)    Family History  Problem Relation Age of Onset   Anxiety disorder Mother    Colon cancer Mother    Heart disease Father    Anxiety disorder Father    Obesity Sister    Anxiety disorder Sister    Depression Sister    Osteoporosis Sister    Breast cancer Paternal Aunt     Social History   Socioeconomic History   Marital status: Widowed    Spouse name: Not on file   Number of children: Not on file   Years of education: Not on file   Highest education level: Not on file  Occupational History   Not on file  Tobacco Use   Smoking status: Never   Smokeless tobacco: Never  Vaping Use   Vaping Use: Never used  Substance and Sexual Activity   Alcohol use: No    Alcohol/week: 0.0 standard drinks   Drug use: Never   Sexual activity: Never  Other Topics Concern   Not on file  Social History Narrative   Not on file   Social Determinants of Health   Financial Resource Strain: Not on file  Food Insecurity: Not on file  Transportation Needs: Not on file  Physical Activity: Not on file  Stress: Not on file  Social Connections: Not on file  Intimate Partner Violence: Not on file    Review of Systems: See HPI, otherwise negative ROS  Physical Exam: There were no vitals taken for this visit. General:   Alert,  pleasant and cooperative in NAD Head:  Normocephalic and atraumatic. Lungs:  Clear to auscultation.    Heart:  Regular rate and rhythm.   Impression/Plan: Keylie Beavers Schemm is here for ophthalmic surgery.  Risks, benefits, limitations, and alternatives regarding ophthalmic surgery have been reviewed with the patient.  Questions have been answered.  All parties agreeable.   Leandrew Koyanagi, MD  10/14/2020, 9:43 AM

## 2020-10-14 NOTE — Anesthesia Postprocedure Evaluation (Signed)
Anesthesia Post Note  Patient: [name]  Procedure(s) Performed: CATARACT EXTRACTION PHACO AND INTRAOCULAR LENS PLACEMENT (IOC)RIGHT  DIABETIC 5.54 00:54.6 (Right: Eye)     Patient location during evaluation: PACU Anesthesia Type: MAC Level of consciousness: awake and alert Pain management: pain level controlled Vital Signs Assessment: post-procedure vital signs reviewed and stable Respiratory status: spontaneous breathing, nonlabored ventilation, respiratory function stable and patient connected to nasal cannula oxygen Cardiovascular status: stable and blood pressure returned to baseline Postop Assessment: no apparent nausea or vomiting Anesthetic complications: no   No notable events documented.  Sinda Du

## 2020-10-14 NOTE — Op Note (Signed)
LOCATION:  [place]   PREOPERATIVE DIAGNOSIS:    Nuclear sclerotic cataract right eye. H25.11   POSTOPERATIVE DIAGNOSIS:  Nuclear sclerotic cataract right eye.     PROCEDURE:  Phacoemusification with posterior chamber intraocular lens placement of the right eye   ULTRASOUND TIME: Procedure(s): CATARACT EXTRACTION PHACO AND INTRAOCULAR LENS PLACEMENT (IOC)RIGHT  DIABETIC 5.54 00:54.6 (Right)  LENS:   Implant Name Type Inv. Item Serial No. Manufacturer Lot No. LRB No. Used Action  LENS IOL TECNIS EYHANCE 25.0 - [id number] Intraocular Lens LENS IOL TECNIS EYHANCE 25.0 [id number]   Right 1 Implanted         SURGEON:  Wyonia Hough, MD   ANESTHESIA:  Topical with tetracaine drops and 2% Xylocaine jelly, augmented with 1% preservative-free intracameral lidocaine.    COMPLICATIONS:  None.   DESCRIPTION OF PROCEDURE:  The patient was identified in the holding room and transported to the operating room and placed in the supine position under the operating microscope.  The right eye was identified as the operative eye and it was prepped and draped in the usual sterile ophthalmic fashion.   A 1 millimeter clear-corneal paracentesis was made at the 12:00 position.  0.5 ml of preservative-free 1% lidocaine was injected into the anterior chamber. The anterior chamber was filled with Viscoat viscoelastic.  A 2.4 millimeter keratome was used to make a near-clear corneal incision at the 9:00 position.  A curvilinear capsulorrhexis was made with a cystotome and capsulorrhexis forceps.  Balanced salt solution was used to hydrodissect and hydrodelineate the nucleus.   Phacoemulsification was then used in stop and chop fashion to remove the lens nucleus and epinucleus.  The remaining cortex was then removed using the irrigation and aspiration handpiece. Provisc was then placed into the capsular bag to distend it for lens placement.  A lens was then injected into the capsular  bag.  The remaining viscoelastic was aspirated.   Wounds were hydrated with balanced salt solution.  The anterior chamber was inflated to a physiologic pressure with balanced salt solution.  No wound leaks were noted. Cefuroxime 0.1 ml of a '10mg'$ /ml solution was injected into the anterior chamber for a dose of 1 mg of intracameral antibiotic at the completion of the case.   Timolol and Brimonidine drops were applied to the eye.  The patient was taken to the recovery room in stable condition without complications of anesthesia or surgery.   Alois Colgan 10/14/2020, 10:56 AM

## 2020-10-14 NOTE — Transfer of Care (Signed)
Immediate Anesthesia Transfer of Care Note  Patient: [name]  Procedure(s) Performed: CATARACT EXTRACTION PHACO AND INTRAOCULAR LENS PLACEMENT (IOC)RIGHT  DIABETIC 5.54 00:54.6 (Right: Eye)  Patient Location: PACU  Anesthesia Type: MAC  Level of Consciousness: awake, alert  and patient cooperative  Airway and Oxygen Therapy: Patient Spontanous Breathing and Patient connected to supplemental oxygen  Post-op Assessment: Post-op Vital signs reviewed, Patient's Cardiovascular Status Stable, Respiratory Function Stable, Patent Airway and No signs of Nausea or vomiting  Post-op Vital Signs: Reviewed and stable  Complications: No notable events documented.

## 2020-10-14 NOTE — Anesthesia Procedure Notes (Signed)
Procedure Name: MAC Date/Time: 10/14/2020 10:42 AM Performed by: Mayme Genta, CRNA Pre-anesthesia Checklist: Patient identified, Emergency Drugs available, Suction available, Timeout performed and Patient being monitored Patient Re-evaluated:Patient Re-evaluated prior to induction Oxygen Delivery Method: Nasal cannula Placement Confirmation: positive ETCO2

## 2020-10-15 ENCOUNTER — Encounter: Payer: Self-pay | Admitting: Ophthalmology

## 2020-10-15 ENCOUNTER — Other Ambulatory Visit: Payer: Self-pay

## 2020-10-20 DIAGNOSIS — H2512 Age-related nuclear cataract, left eye: Secondary | ICD-10-CM | POA: Diagnosis not present

## 2020-10-26 NOTE — Discharge Instructions (Signed)

## 2020-10-27 ENCOUNTER — Other Ambulatory Visit: Payer: Self-pay | Admitting: Nurse Practitioner

## 2020-10-27 DIAGNOSIS — E1165 Type 2 diabetes mellitus with hyperglycemia: Secondary | ICD-10-CM

## 2020-10-28 ENCOUNTER — Ambulatory Visit
Admission: RE | Admit: 2020-10-28 | Discharge: 2020-10-28 | Disposition: A | Discharge status: Home / Self Care | Payer: Medicare Other | Attending: Ophthalmology | Admitting: Ophthalmology

## 2020-10-28 ENCOUNTER — Ambulatory Visit: Payer: Medicare Other | Admitting: Anesthesiology

## 2020-10-28 ENCOUNTER — Encounter: Payer: Self-pay | Admitting: Ophthalmology

## 2020-10-28 ENCOUNTER — Other Ambulatory Visit: Payer: Self-pay | Admitting: Nurse Practitioner

## 2020-10-28 ENCOUNTER — Encounter
Admission: RE | Disposition: A | Discharge status: Home / Self Care | Payer: Self-pay | Source: Home / Self Care | Attending: Ophthalmology

## 2020-10-28 ENCOUNTER — Other Ambulatory Visit: Payer: Self-pay

## 2020-10-28 DIAGNOSIS — Z7984 Long term (current) use of oral hypoglycemic drugs: Secondary | ICD-10-CM | POA: Diagnosis not present

## 2020-10-28 DIAGNOSIS — Z791 Long term (current) use of non-steroidal anti-inflammatories (NSAID): Secondary | ICD-10-CM | POA: Insufficient documentation

## 2020-10-28 DIAGNOSIS — E1136 Type 2 diabetes mellitus with diabetic cataract: Secondary | ICD-10-CM | POA: Diagnosis not present

## 2020-10-28 DIAGNOSIS — H25812 Combined forms of age-related cataract, left eye: Secondary | ICD-10-CM | POA: Diagnosis not present

## 2020-10-28 DIAGNOSIS — Z7983 Long term (current) use of bisphosphonates: Secondary | ICD-10-CM | POA: Insufficient documentation

## 2020-10-28 DIAGNOSIS — Z7982 Long term (current) use of aspirin: Secondary | ICD-10-CM | POA: Insufficient documentation

## 2020-10-28 DIAGNOSIS — H2512 Age-related nuclear cataract, left eye: Secondary | ICD-10-CM | POA: Insufficient documentation

## 2020-10-28 DIAGNOSIS — Z79899 Other long term (current) drug therapy: Secondary | ICD-10-CM | POA: Diagnosis not present

## 2020-10-28 DIAGNOSIS — M810 Age-related osteoporosis without current pathological fracture: Secondary | ICD-10-CM

## 2020-10-28 HISTORY — PX: CATARACT EXTRACTION W/PHACO: SHX586

## 2020-10-28 LAB — GLUCOSE, CAPILLARY
Glucose-Capillary: 164 mg/dL — ABNORMAL HIGH (ref 70–99)
Glucose-Capillary: 150 mg/dL — ABNORMAL HIGH (ref 70–99)

## 2020-10-28 SURGERY — PHACOEMULSIFICATION, CATARACT, WITH IOL INSERTION
Anesthesia: Monitor Anesthesia Care | Site: Eye | Laterality: Left

## 2020-10-28 MED ORDER — LACTATED RINGERS IV SOLN: INTRAVENOUS | Status: DC

## 2020-10-28 MED ORDER — BRIMONIDINE TARTRATE-TIMOLOL 0.2-0.5 % OP SOLN
OPHTHALMIC | Status: DC | PRN
  Administered 2020-10-28: 1 [drp] via OPHTHALMIC

## 2020-10-28 MED ORDER — ACETAMINOPHEN 500 MG PO TABS: 1000.0000 mg | ORAL_TABLET | Freq: Once | ORAL | Status: DC | PRN

## 2020-10-28 MED ORDER — SIGHTPATH DOSE#1 NA HYALUR & NA CHOND-NA HYALUR IO KIT
PACK | INTRAOCULAR | Status: DC | PRN
  Administered 2020-10-28: 1 via OPHTHALMIC

## 2020-10-28 MED ORDER — TETRACAINE HCL 0.5 % OP SOLN
1.0000 [drp] | OPHTHALMIC | Status: DC | PRN
  Administered 2020-10-28 (×3): 1 [drp] via OPHTHALMIC

## 2020-10-28 MED ORDER — CEFUROXIME OPHTHALMIC INJECTION 1 MG/0.1 ML
INJECTION | OPHTHALMIC | Status: DC | PRN
  Administered 2020-10-28: 1 mg via OPHTHALMIC

## 2020-10-28 MED ORDER — SIGHTPATH DOSE#1 BSS IO SOLN
INTRAOCULAR | Status: DC | PRN
  Administered 2020-10-28: 1 mL via INTRAMUSCULAR

## 2020-10-28 MED ORDER — MIDAZOLAM HCL 2 MG/2ML IJ SOLN
INTRAMUSCULAR | Status: DC | PRN
  Administered 2020-10-28: 2 mg via INTRAVENOUS

## 2020-10-28 MED ORDER — FENTANYL CITRATE (PF) 100 MCG/2ML IJ SOLN
INTRAMUSCULAR | Status: DC | PRN
  Administered 2020-10-28 (×2): 50 ug via INTRAVENOUS

## 2020-10-28 MED ORDER — ARMC OPHTHALMIC DILATING DROPS
1.0000 "application " | OPHTHALMIC | Status: DC | PRN
  Administered 2020-10-28 (×3): 1 via OPHTHALMIC

## 2020-10-28 MED ORDER — SIGHTPATH DOSE#1 BSS IO SOLN
INTRAOCULAR | Status: DC | PRN
  Administered 2020-10-28: 71 mL via OPHTHALMIC

## 2020-10-28 MED ORDER — ACETAMINOPHEN 160 MG/5ML PO SOLN: 975.0000 mg | Freq: Once | ORAL | Status: DC | PRN

## 2020-10-28 MED ORDER — ONDANSETRON HCL 4 MG/2ML IJ SOLN: 4.0000 mg | Freq: Once | INTRAMUSCULAR | Status: DC | PRN

## 2020-10-28 MED ORDER — SIGHTPATH DOSE#1 BSS IO SOLN
INTRAOCULAR | Status: DC | PRN
  Administered 2020-10-28: 15 mL

## 2020-10-28 SURGICAL SUPPLY — 13 items
GLOVE SRG 8 PF TXTR STRL LF DI (GLOVE) ×1 IMPLANT
GLOVE SURG ENC TEXT LTX SZ7.5 (GLOVE) ×2 IMPLANT
GLOVE SURG UNDER POLY LF SZ8 (GLOVE) ×2
GOWN STRL REUS W/ TWL LRG LVL3 (GOWN DISPOSABLE) ×2 IMPLANT
GOWN STRL REUS W/TWL LRG LVL3 (GOWN DISPOSABLE) ×4
LENS IOL TECNIS EYHANCE 25.0 (Intraocular Lens) ×2 IMPLANT
MARKER SKIN DUAL TIP RULER LAB (MISCELLANEOUS) ×2 IMPLANT
NEEDLE FILTER BLUNT 18X 1/2SAF (NEEDLE) ×2
NEEDLE FILTER BLUNT 18X1 1/2 (NEEDLE) ×2 IMPLANT
SYR 3ML LL SCALE MARK (SYRINGE) ×4 IMPLANT
SYR TB 1ML LUER SLIP (SYRINGE) ×2 IMPLANT
WATER STERILE IRR 250ML POUR (IV SOLUTION) ×2 IMPLANT
WIPE NON LINTING 3.25X3.25 (MISCELLANEOUS) ×2 IMPLANT

## 2020-10-28 NOTE — H&P (Signed)
[facility]   Primary Care Physician:  Ronnell Freshwater, NP Ophthalmologist: Dr. Leandrew Koyanagi  Pre-Procedure History & Physical: HPI:  [name] is a 75 y.o. female here for ophthalmic surgery.   Past Medical History:  Diagnosis Date   Anxiety    Depression    Diabetes mellitus, type II ([place])    GERD (gastroesophageal reflux disease)    Hypertension    Thyroid disease     Past Surgical History:  Procedure Laterality Date   ABDOMINAL HYSTERECTOMY     BREAST EXCISIONAL BIOPSY Bilateral 80s   benign   CATARACT EXTRACTION W/PHACO Right 10/14/2020   Procedure: CATARACT EXTRACTION PHACO AND INTRAOCULAR LENS PLACEMENT (IOC)RIGHT  DIABETIC 5.54 00:54.6;  Surgeon: Leandrew Koyanagi, MD;  Location: [place];  Service: Ophthalmology;  Laterality: Right;   COLONOSCOPY WITH PROPOFOL N/A 04/12/2019   Procedure: COLONOSCOPY WITH PROPOFOL;  Surgeon: Jonathon Bellows, MD;  Location: [facility] ENDOSCOPY;  Service: Gastroenterology;  Laterality: N/A;   JOINT REPLACEMENT Right 2019    Prior to Admission medications   Medication Sig Start Date End Date Taking? Authorizing Provider  alendronate (FOSAMAX) 35 MG tablet Take 1 tablet (35 mg total) by mouth every 7 (seven) days. Take with a full glass of water on an empty stomach. 05/25/20  Yes Ronnell Freshwater, NP  aspirin 81 MG tablet Take 1 tablet by mouth daily.   Yes [provider]  atorvastatin (LIPITOR) 20 MG tablet Take 1 tablet (20 mg total) by mouth daily. 05/25/20 11/21/20 Yes Boscia, Heather E, NP  busPIRone (BUSPAR) 10 MG tablet Take 1/2 to 1 tablet by mouth twice daily. 05/25/20  Yes Boscia, Greer Ee, NP  citalopram (CELEXA) 40 MG tablet Take 1 tablet (40 mg total) by mouth daily. 05/25/20  Yes Boscia, Greer Ee, NP  diclofenac Sodium (VOLTAREN) 1 % GEL Apply 4 g topically 4 (four) times daily. 05/25/20  Yes Boscia, Greer Ee, NP  diltiazem (CARDIZEM CD) 240 MG 24 hr capsule TAKE 1 CAPSULE(240 MG) BY MOUTH  DAILY 05/25/20  Yes Boscia, Heather E, NP  FLUAD 0.5 ML SUSY ADM 0.5ML IM UTD 10/20/17  Yes [provider]  levothyroxine (SYNTHROID) 50 MCG tablet Take 1 tablet (50 mcg total) by mouth every morning. 05/25/20  Yes Boscia, Heather E, NP  lisinopril (ZESTRIL) 30 MG tablet TAKE 1 TABLET BY MOUTH ONCE A DAY 10/01/20  Yes Boscia, Heather E, NP  meloxicam (MOBIC) 15 MG tablet Take 15 mg by mouth daily.   Yes [provider]  metFORMIN (GLUCOPHAGE) 850 MG tablet TAKE 1 TABLET (850 MG TOTAL) BY MOUTH 2 (TWO) TIMES DAILY WITH A MEAL. 10/27/20  Yes Ronnell Freshwater, NP  metoprolol tartrate (LOPRESSOR) 25 MG tablet Take 1 tablet (25 mg total) by mouth 2 (two) times daily. 05/25/20  Yes Boscia, Heather E, NP  pantoprazole (PROTONIX) 40 MG tablet Take 1 tablet (40 mg total) by mouth 2 (two) times daily. 05/25/20  Yes Boscia, Heather E, NP  promethazine (PHENERGAN) 25 MG tablet TAKE 1 TABLET BY MOUTH TWICE A DAY AS NEEDED FOR NAUSEA OR VOMITING 08/03/20  Yes Boscia, Heather E, NP  QUEtiapine (SEROQUEL) 50 MG tablet Take 1 tablet (50 mg total) by mouth 3 (three) times daily. May take 1 tablet in afternoons if needed for severe anxiety. 06/29/20  Yes Boscia, Greer Ee, NP  rOPINIRole (REQUIP) 0.25 MG tablet Take 1 tablet (0.25 mg total) by mouth 2 times daily at 12 noon and 4 pm. 05/25/20  Yes Boscia, Heather E, NP  sucralfate (CARAFATE) 1 g tablet TAKE 1 TABLET BY MOUTH FOUR TIMES A DAY IF NEEDED 05/25/20  Yes Boscia, Heather E, NP  tiZANidine (ZANAFLEX) 2 MG tablet Take 1 tablet (2 mg total) by mouth 2 (two) times daily as needed for muscle spasms. 02/26/20  Yes Luiz Ochoa, NP  traMADol (ULTRAM) 50 MG tablet Take by mouth every 6 (six) hours as needed.   Yes [provider]  zolpidem (AMBIEN) 10 MG tablet Take 1 tablet (10 mg total) by mouth at bedtime as needed for sleep. 10/05/20  Yes Leretha Pol E, NP  glucose blood test strip Blood glucose testing done daily and as needed 08/25/20    Ronnell Freshwater, NP  glucose monitoring kit (FREESTYLE) monitoring kit Blood sugar testing to be done daily and as needed 08/25/20   Ronnell Freshwater, NP  Lancets 30G MISC Blood glucose testing to be done daily ane as needed 08/25/20   Ronnell Freshwater, NP  Zoster Vaccine Adjuvanted [facility]) injection Shingles vaccine series - inject IM as directed . 01/06/20   Ronnell Freshwater, NP    Allergies as of 09/17/2020   (No Known Allergies)    Family History  Problem Relation Age of Onset   Anxiety disorder Mother    Colon cancer Mother    Heart disease Father    Anxiety disorder Father    Obesity Sister    Anxiety disorder Sister    Depression Sister    Osteoporosis Sister    Breast cancer Paternal Aunt     Social History   Socioeconomic History   Marital status: Widowed    Spouse name: Not on file   Number of children: Not on file   Years of education: Not on file   Highest education level: Not on file  Occupational History   Not on file  Tobacco Use   Smoking status: Never   Smokeless tobacco: Never  Vaping Use   Vaping Use: Never used  Substance and Sexual Activity   Alcohol use: No    Alcohol/week: 0.0 standard drinks   Drug use: Never   Sexual activity: Never  Other Topics Concern   Not on file  Social History Narrative   Not on file   Social Determinants of Health   Financial Resource Strain: Not on file  Food Insecurity: Not on file  Transportation Needs: Not on file  Physical Activity: Not on file  Stress: Not on file  Social Connections: Not on file  Intimate Partner Violence: Not on file    Review of Systems: See HPI, otherwise negative ROS  Physical Exam: BP (!) 147/67   Pulse 65   Temp (!) 97.2 F (36.2 C)   Resp 16   Ht 5' (1.524 m)   Wt 81.6 kg   SpO2 93%   BMI 35.15 kg/m  General:   Alert,  pleasant and cooperative in NAD Head:  Normocephalic and atraumatic. Lungs:  Clear to auscultation.    Heart:  Regular rate and rhythm.    Impression/Plan: [name] is here for ophthalmic surgery.  Risks, benefits, limitations, and alternatives regarding ophthalmic surgery have been reviewed with the patient.  Questions have been answered.  All parties agreeable.   Leandrew Koyanagi, MD  10/28/2020, 7:36 AM

## 2020-10-28 NOTE — Anesthesia Procedure Notes (Signed)
Procedure Name: MAC Date/Time: 10/28/2020 7:49 AM Performed by: Jeannene Patella, CRNA Pre-anesthesia Checklist: Patient identified, Emergency Drugs available, Suction available, Timeout performed and Patient being monitored Patient Re-evaluated:Patient Re-evaluated prior to induction Oxygen Delivery Method: Nasal cannula Placement Confirmation: positive ETCO2

## 2020-10-28 NOTE — Transfer of Care (Signed)
Immediate Anesthesia Transfer of Care Note  Patient: [name]  Procedure(s) Performed: CATARACT EXTRACTION PHACO AND INTRAOCULAR LENS PLACEMENT (IOC) LEFT DIABETIC (Left: Eye)  Patient Location: PACU  Anesthesia Type: MAC  Level of Consciousness: awake, alert  and patient cooperative  Airway and Oxygen Therapy: Patient Spontanous Breathing and Patient connected to supplemental oxygen  Post-op Assessment: Post-op Vital signs reviewed, Patient's Cardiovascular Status Stable, Respiratory Function Stable, Patent Airway and No signs of Nausea or vomiting  Post-op Vital Signs: Reviewed and stable  Complications: No notable events documented.

## 2020-10-28 NOTE — Anesthesia Preprocedure Evaluation (Signed)
Anesthesia Evaluation  Patient identified by MRN, date of birth, ID band Patient awake    Reviewed: Allergy & Precautions, NPO status , Patient's Chart, lab work & pertinent test results  History of Anesthesia Complications Negative for: history of anesthetic complications  Airway Mallampati: II  TM Distance: >3 FB Neck ROM: Full    Dental no notable dental hx. (+) Teeth Intact   Pulmonary neg pulmonary ROS, neg sleep apnea, neg COPD, Patient abstained from smoking.Not current smoker,    Pulmonary exam normal breath sounds clear to auscultation       Cardiovascular Exercise Tolerance: Good METShypertension, Pt. on medications (-) CAD and (-) Past MI (-) dysrhythmias + Valvular Problems/Murmurs MVP  Rhythm:Regular Rate:Normal + Systolic murmurs    Neuro/Psych PSYCHIATRIC DISORDERS Anxiety Depression  Neuromuscular disease negative psych ROS   GI/Hepatic GERD  Controlled,(+)     (-) substance abuse  ,   Endo/Other  diabetes, Type 2, Oral Hypoglycemic AgentsHypothyroidism   Renal/GU Renal disease     Musculoskeletal  (+) Arthritis ,   Abdominal   Peds  Hematology   Anesthesia Other Findings Past Medical History: No date: Anxiety No date: Depression No date: Diabetes mellitus, type II ([facility]) No date: GERD (gastroesophageal reflux disease) No date: Hypertension No date: Thyroid disease  Reproductive/Obstetrics                             Anesthesia Physical  Anesthesia Plan  ASA: 3  Anesthesia Plan: MAC   Post-op Pain Management:    Induction: Intravenous  PONV Risk Score and Plan: 2 and TIVA, Treatment may vary due to age or medical condition and Midazolam  Airway Management Planned: Nasal Cannula and Natural Airway  Additional Equipment: None  Intra-op Plan:   Post-operative Plan:   Informed Consent: I have reviewed the patients History and Physical, chart, labs and  discussed the procedure including the risks, benefits and alternatives for the proposed anesthesia with the patient or authorized representative who has indicated his/her understanding and acceptance.       Plan Discussed with: CRNA  Anesthesia Plan Comments: (Discussed risks of anesthesia with patient, including possibility of difficulty with spontaneous ventilation under anesthesia necessitating airway intervention, PONV, and rare risks such as cardiac or respiratory or neurological events. Patient understands.)        Anesthesia Quick Evaluation Patient Active Problem List   Diagnosis Date Noted  . 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 Encounter to establish care 05/25/2020  . Mixed hyperlipidemia 05/25/2020  . Need for shingles vaccine 01/26/2020  . Age-related cataract 12/29/2019  . Mitral valve prolapse 03/25/2019  . Gastroesophageal reflux disease without esophagitis 03/25/2019  . Restless leg syndrome 10/05/2018  . Acute eczematoid otitis externa of left ear 08/21/2018  . Age related osteoporosis 08/21/2018  . Vaginal yeast infection 06/18/2018  . Encounter for general adult medical examination with abnormal findings 12/30/2017  . Acute low back pain with sciatica 12/30/2017  . Fatigue due to sleep pattern disturbance 12/30/2017  . Ovarian failure 09/27/2017  . 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 Essential hypertension, benign 08/05/2017  . Type 2 diabetes mellitus with hyperglycemia, without long-term current use of insulin ([place]) 08/05/2017  . Hypothyroidism 08/05/2017  . Depression, major, single episode, severe ([place]) 08/05/2017  . Need for vaccination against Streptococcus pneumoniae using pneumococcal conjugate vaccine 13 08/05/2017  . Body mass index (BMI)  of 35.0-35.9 in adult 04/07/2017  . Acute ischemic colitis ([place]) 09/12/2016  . Nausea and vomiting 09/12/2016  . Acute  urinary retention 09/12/2016  . Acute renal insufficiency 09/12/2016  . Colitis 09/12/2016  . 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  . Primary osteoarthritis of right knee 07/28/2016  . Depression, major, recurrent, moderate ([place]) 07/22/2014  . Anxiety, generalized 07/22/2014  . Other insomnia 07/22/2014  . Panic disorder without agoraphobia 07/22/2014  . H/O: obesity 06/01/2014  . H/O: HTN (hypertension) 06/01/2014  . H/O gastrointestinal disease 06/01/2014  . H/O diabetes mellitus 06/01/2014  . H/O elevated lipids 06/01/2014  . History of hay fever 06/01/2014    CBC Latest Ref Rng & Units 12/27/2019 12/26/2018 04/09/2018  WBC 3.4 - 10.8 x10E3/uL 12.0(H) 9.0 9.2  Hemoglobin 11.1 - 15.9 g/dL 12.7 12.4 12.5  Hematocrit 34.0 - 46.6 % 38.7 36.3 38.3  Platelets 150 - 450 x10E3/uL 269 302 279   BMP Latest Ref Rng & Units 12/27/2019 12/26/2018 04/09/2018  Glucose 65 - 99 mg/dL 163(H) 168(H) 225(H)  BUN 8 - 27 mg/dL 8 6(L) 7(L)  Creatinine 0.57 - 1.00 mg/dL 0.77 0.77 0.59  BUN/Creat Ratio 12 - 28 10(L) 8(L) -  Sodium 134 - 144 mmol/L 137 136 136  Potassium 3.5 - 5.2 mmol/L 4.7 4.8 3.3(L)  Chloride 96 - 106 mmol/L 100 97 102  CO2 20 - 29 mmol/L _0 Calcium 8.7 - 10.3 mg/dL 10.3 10.0 9.8    Risks and benefits of anesthesia discussed at length, patient or surrogate demonstrates understanding. Appropriately NPO. Plan to proceed with anesthesia.  Champ Mungo, MD 10/28/20

## 2020-10-28 NOTE — Op Note (Signed)
  OPERATIVE NOTE  [name] [id number] 10/28/2020   PREOPERATIVE DIAGNOSIS:  Nuclear sclerotic cataract left eye. H25.12   POSTOPERATIVE DIAGNOSIS:    Nuclear sclerotic cataract left eye.     PROCEDURE:  Phacoemusification with posterior chamber intraocular lens placement of the left eye  Ultrasound time: Procedure(s) with comments: CATARACT EXTRACTION PHACO AND INTRAOCULAR LENS PLACEMENT (IOC) LEFT DIABETIC (Left) - 8.13 01:09.5  LENS:   Implant Name Type Inv. Item Serial No. Manufacturer Lot No. LRB No. Used Action  LENS IOL TECNIS EYHANCE 25.0 - [id number] Intraocular Lens LENS IOL TECNIS EYHANCE 25.0 [id number] JOHNSON   Left 1 Implanted      SURGEON:  Wyonia Hough, MD   ANESTHESIA:  Topical with tetracaine drops and 2% Xylocaine jelly, augmented with 1% preservative-free intracameral lidocaine.    COMPLICATIONS:  None.   DESCRIPTION OF PROCEDURE:  The patient was identified in the holding room and transported to the operating room and placed in the supine position under the operating microscope.  The left eye was identified as the operative eye and it was prepped and draped in the usual sterile ophthalmic fashion.   A 1 millimeter clear-corneal paracentesis was made at the 1:30 position.  0.5 ml of preservative-free 1% lidocaine was injected into the anterior chamber.  The anterior chamber was filled with Viscoat viscoelastic.  A 2.4 millimeter keratome was used to make a near-clear corneal incision at the 10:30 position.  .  A curvilinear capsulorrhexis was made with a cystotome and capsulorrhexis forceps.  Balanced salt solution was used to hydrodissect and hydrodelineate the nucleus.   Phacoemulsification was then used in stop and chop fashion to remove the lens nucleus and epinucleus.  The remaining cortex was then removed using the irrigation and aspiration handpiece. Provisc was then placed into the capsular bag to distend it for lens placement.  A lens  was then injected into the capsular bag.  The remaining viscoelastic was aspirated.   Wounds were hydrated with balanced salt solution.  The anterior chamber was inflated to a physiologic pressure with balanced salt solution.  No wound leaks were noted. Cefuroxime 0.1 ml of a 10mg /ml solution was injected into the anterior chamber for a dose of 1 mg of intracameral antibiotic at the completion of the case.   Timolol and Brimonidine drops were applied to the eye.  The patient was taken to the recovery room in stable condition without complications of anesthesia or surgery.  Terris Bodin 10/28/2020, 8:01 AM

## 2020-10-28 NOTE — Anesthesia Postprocedure Evaluation (Signed)
Anesthesia Post Note  Patient: [name]  Procedure(s) Performed: CATARACT EXTRACTION PHACO AND INTRAOCULAR LENS PLACEMENT (IOC) LEFT DIABETIC (Left: Eye)     Patient location during evaluation: PACU Anesthesia Type: MAC Level of consciousness: awake and alert Pain management: pain level controlled Vital Signs Assessment: post-procedure vital signs reviewed and stable Respiratory status: spontaneous breathing, nonlabored ventilation and respiratory function stable Cardiovascular status: stable and blood pressure returned to baseline Postop Assessment: no apparent nausea or vomiting Anesthetic complications: no   No notable events documented.  April Manson

## 2020-10-29 ENCOUNTER — Encounter: Payer: Self-pay | Admitting: Ophthalmology

## 2020-10-31 ENCOUNTER — Other Ambulatory Visit: Payer: Self-pay | Admitting: Nurse Practitioner

## 2020-10-31 DIAGNOSIS — I10 Essential (primary) hypertension: Secondary | ICD-10-CM

## 2020-11-13 ENCOUNTER — Ambulatory Visit (INDEPENDENT_AMBULATORY_CARE_PROVIDER_SITE_OTHER): Payer: Medicare Other | Admitting: Nurse Practitioner

## 2020-11-13 ENCOUNTER — Encounter: Payer: Self-pay | Admitting: Nurse Practitioner

## 2020-11-13 ENCOUNTER — Other Ambulatory Visit: Payer: Self-pay

## 2020-11-13 VITALS — BP 119/71 | HR 63 | Temp 97.7°F | Ht 60.0 in | Wt 180.1 lb

## 2020-11-13 DIAGNOSIS — I10 Essential (primary) hypertension: Secondary | ICD-10-CM

## 2020-11-13 DIAGNOSIS — R609 Edema, unspecified: Secondary | ICD-10-CM

## 2020-11-13 DIAGNOSIS — E1165 Type 2 diabetes mellitus with hyperglycemia: Secondary | ICD-10-CM

## 2020-11-13 DIAGNOSIS — M5136 Other intervertebral disc degeneration, lumbar region: Secondary | ICD-10-CM | POA: Diagnosis not present

## 2020-11-13 DIAGNOSIS — Z6835 Body mass index (BMI) 35.0-35.9, adult: Secondary | ICD-10-CM

## 2020-11-13 MED ORDER — HYDROCHLOROTHIAZIDE 12.5 MG PO TABS: ORAL_TABLET | ORAL | 2 refills | Status: DC

## 2020-11-13 NOTE — Progress Notes (Signed)
Acute Office Visit  Subjective:    Patient ID: [name], female    DOB: 1945/12/22, 75 y.o.   MRN: [id number]  Chief Complaint  Patient presents with   Foot Swelling    Patient states that she has had swelling in both ankles and feet, worse on the left side than right. Also experiencing a "zap" type sensation in the big toe of her right foot. This lasts for a few seconds then subsides on it's own. She does have spinal stenosis and bulging discs in lumbar spine for which she did get transformational epidural injections into her spine. She wonders if the sharp and stabbing pain is coming from diabetic neuropathy or if pain is related to her back.   Ankle Pain  The incident occurred more than 1 week ago. The incident occurred at home. There was no injury mechanism. The pain is present in the right foot and right ankle (happening in both feet, but mostly in left foot and ankle). The quality of the pain is described as burning (describes as shocking type feeling.). Pain scale: states that when she gets "sap" sensation, pain is 8/10. The pain is moderate. The pain has been Fluctuating since onset. Associated symptoms include a loss of motion, numbness and tingling. Pertinent negatives include no inability to bear weight. Associated symptoms comments: Makes her feel as though she is off-balance. . She reports no foreign bodies present. Nothing aggravates the symptoms. She has tried rest for the symptoms. The treatment provided mild relief.    Past Medical History:  Diagnosis Date   Anxiety    Depression    Diabetes mellitus, type II ([place])    GERD (gastroesophageal reflux disease)    Hypertension    Thyroid disease     Past Surgical History:  Procedure Laterality Date   ABDOMINAL HYSTERECTOMY     BREAST EXCISIONAL BIOPSY Bilateral 80s   benign   CATARACT EXTRACTION W/PHACO Right 10/14/2020   Procedure: CATARACT EXTRACTION PHACO AND INTRAOCULAR LENS PLACEMENT (IOC)RIGHT  DIABETIC  5.54 00:54.6;  Surgeon: Leandrew Koyanagi, MD;  Location: [place];  Service: Ophthalmology;  Laterality: Right;   CATARACT EXTRACTION W/PHACO Left 10/28/2020   Procedure: CATARACT EXTRACTION PHACO AND INTRAOCULAR LENS PLACEMENT ([place]) LEFT DIABETIC;  Surgeon: Leandrew Koyanagi, MD;  Location: [place];  Service: Ophthalmology;  Laterality: Left;  8.13 01:09.5   COLONOSCOPY WITH PROPOFOL N/A 04/12/2019   Procedure: COLONOSCOPY WITH PROPOFOL;  Surgeon: Jonathon Bellows, MD;  Location: [facility] ENDOSCOPY;  Service: Gastroenterology;  Laterality: N/A;   JOINT REPLACEMENT Right 2019    Family History  Problem Relation Age of Onset   Anxiety disorder Mother    Colon cancer Mother    Heart disease Father    Anxiety disorder Father    Obesity Sister    Anxiety disorder Sister    Depression Sister    Osteoporosis Sister    Breast cancer Paternal Aunt     Social History   Socioeconomic History   Marital status: Widowed    Spouse name: Not on file   Number of children: Not on file   Years of education: Not on file   Highest education level: Not on file  Occupational History   Not on file  Tobacco Use   Smoking status: Never   Smokeless tobacco: Never  Vaping Use   Vaping Use: Never used  Substance and Sexual Activity   Alcohol use: No    Alcohol/week: 0.0 standard drinks   Drug use:  Never   Sexual activity: Never  Other Topics Concern   Not on file  Social History Narrative   Not on file   Social Determinants of Health   Financial Resource Strain: Not on file  Food Insecurity: Not on file  Transportation Needs: Not on file  Physical Activity: Not on file  Stress: Not on file  Social Connections: Not on file  Intimate Partner Violence: Not on file    Outpatient Medications Prior to Visit  Medication Sig Dispense Refill   alendronate (FOSAMAX) 35 MG tablet TAKE 1 TABLET BY MOUTH EVERY 7 DAYS. TAKE WITH A FULL GLASS OF WATER ON AN EMPTY STOMACH. 12  tablet 1   aspirin 81 MG tablet Take 1 tablet by mouth daily.     atorvastatin (LIPITOR) 20 MG tablet Take 1 tablet (20 mg total) by mouth daily. 90 tablet 1   busPIRone (BUSPAR) 10 MG tablet Take 1/2 to 1 tablet by mouth twice daily. 180 tablet 1   citalopram (CELEXA) 40 MG tablet Take 1 tablet (40 mg total) by mouth daily. 90 tablet 1   diclofenac Sodium (VOLTAREN) 1 % GEL Apply 4 g topically 4 (four) times daily. 100 g 2   diltiazem (CARDIZEM CD) 240 MG 24 hr capsule TAKE 1 CAPSULE(240 MG) BY MOUTH DAILY 90 capsule 1   FLUAD 0.5 ML SUSY ADM 0.5ML IM UTD  0   glucose blood test strip Blood glucose testing done daily and as needed 100 each 12   glucose monitoring kit (FREESTYLE) monitoring kit Blood sugar testing to be done daily and as needed 1 each 0   Lancets 30G MISC Blood glucose testing to be done daily ane as needed 100 each 1   levothyroxine (SYNTHROID) 50 MCG tablet Take 1 tablet (50 mcg total) by mouth every morning. 90 tablet 1   lisinopril (ZESTRIL) 30 MG tablet TAKE 1 TABLET BY MOUTH ONCE A DAY 30 tablet 1   meloxicam (MOBIC) 15 MG tablet Take 15 mg by mouth daily.     metFORMIN (GLUCOPHAGE) 850 MG tablet TAKE 1 TABLET (850 MG TOTAL) BY MOUTH 2 (TWO) TIMES DAILY WITH A MEAL. 180 tablet 1   metoprolol tartrate (LOPRESSOR) 25 MG tablet TAKE 1 TABLET BY MOUTH TWICE A DAY 180 tablet 1   pantoprazole (PROTONIX) 40 MG tablet Take 1 tablet (40 mg total) by mouth 2 (two) times daily. 180 tablet 1   promethazine (PHENERGAN) 25 MG tablet TAKE 1 TABLET BY MOUTH TWICE A DAY AS NEEDED FOR NAUSEA OR VOMITING 30 tablet 3   QUEtiapine (SEROQUEL) 50 MG tablet Take 1 tablet (50 mg total) by mouth 3 (three) times daily. May take 1 tablet in afternoons if needed for severe anxiety. 270 tablet 1   rOPINIRole (REQUIP) 0.25 MG tablet Take 1 tablet (0.25 mg total) by mouth 2 times daily at 12 noon and 4 pm. 180 tablet 1   sucralfate (CARAFATE) 1 g tablet TAKE 1 TABLET BY MOUTH FOUR TIMES A DAY IF NEEDED  360 tablet 1   tiZANidine (ZANAFLEX) 2 MG tablet Take 1 tablet (2 mg total) by mouth 2 (two) times daily as needed for muscle spasms. 45 tablet 1   traMADol (ULTRAM) 50 MG tablet Take by mouth every 6 (six) hours as needed.     zolpidem (AMBIEN) 10 MG tablet Take 1 tablet (10 mg total) by mouth at bedtime as needed for sleep. 90 tablet 0   Zoster Vaccine Adjuvanted [facility]) injection Shingles vaccine series -  inject IM as directed . 0.5 mL 1   No facility-administered medications prior to visit.    No Known Allergies  Review of Systems  Constitutional:  Positive for activity change. Negative for appetite change, chills, fatigue and fever.       Patient has had decreased activity levels since increased pain and swelling in the ankles.  HENT:  Negative for congestion, postnasal drip, rhinorrhea, sinus pressure, sinus pain, sneezing and sore throat.   Eyes: Negative.   Respiratory:  Negative for cough, chest tightness, shortness of breath and wheezing.   Cardiovascular:  Positive for leg swelling. Negative for chest pain and palpitations.  Gastrointestinal:  Negative for abdominal pain, constipation, diarrhea, nausea and vomiting.  Endocrine: Negative for cold intolerance, heat intolerance, polydipsia and polyuria.       Patient reporting some increased in blood sugars since having steroid injections into her spine.  Genitourinary:  Negative for dyspareunia, dysuria, flank pain, frequency and urgency.  Musculoskeletal:  Positive for arthralgias and gait problem. Negative for back pain and myalgias.       Patient reporting some swelling in bilateral feet and ankles.  Also having increased pain, described as sharp and shooting in nature.  No injury or trauma reported at this time.  Skin:  Negative for rash.  Allergic/Immunologic: Negative for environmental allergies.  Neurological:  Positive for tingling and numbness. Negative for dizziness, weakness and headaches.  Hematological:  Negative  for adenopathy.  Psychiatric/Behavioral:  Positive for dysphoric mood and sleep disturbance. The patient is nervous/anxious.       Objective:    Physical Exam Vitals and nursing note reviewed.  Constitutional:      Appearance: Normal appearance. She is well-developed.  HENT:     Head: Normocephalic and atraumatic.     Nose: Nose normal.     Mouth/Throat:     Mouth: Mucous membranes are moist.  Eyes:     Extraocular Movements: Extraocular movements intact.     Conjunctiva/sclera: Conjunctivae normal.     Pupils: Pupils are equal, round, and reactive to light.  Neck:     Vascular: No carotid bruit.  Cardiovascular:     Rate and Rhythm: Normal rate and regular rhythm.     Pulses: Normal pulses.     Heart sounds: Normal heart sounds.     Comments: IDVery mild, nonpitting present in bilateral feet and ankles.  Capillary refill less than 3 seconds.  Feet and toes are warm to palpate. Pulmonary:     Effort: Pulmonary effort is normal.     Breath sounds: Normal breath sounds.  Abdominal:     General: Bowel sounds are normal.     Palpations: Abdomen is soft.     Tenderness: There is no abdominal tenderness.  Musculoskeletal:        General: Normal range of motion.     Cervical back: Normal range of motion and neck supple.     Comments: Mild, nonpitting edema present in bilateral lower extremities.  No joint deformity palpated at this time.  Range of motion and strength are intact, bilaterally.  Capillary refill is less than 3 seconds bilaterally.  Lymphadenopathy:     Cervical: No cervical adenopathy.  Skin:    General: Skin is warm and dry.     Capillary Refill: Capillary refill takes less than 2 seconds.  Neurological:     General: No focal deficit present.     Mental Status: She is alert and oriented to person, place, and time.  Psychiatric:        Mood and Affect: Mood normal.        Behavior: Behavior normal.        Thought Content: Thought content normal.         Judgment: Judgment normal.    Today's Vitals   11/13/20 1015  BP: 119/71  Pulse: 63  Temp: 97.7 F (36.5 C)  SpO2: 96%  Weight: 180 lb 1.9 oz (81.7 kg)  Height: 5' (1.524 m)   Body mass index is 35.18 kg/m.   Wt Readings from Last 3 Encounters:  11/13/20 180 lb 1.9 oz (81.7 kg)  10/28/20 180 lb (81.6 kg)  10/14/20 179 lb (81.2 kg)    Health Maintenance Due  Topic Date Due   TETANUS/TDAP  Never done   INFLUENZA VACCINE  09/07/2020    There are no preventive care reminders to display for this patient.   Lab Results  Component Value Date   TSH 0.918 12/27/2019   Lab Results  Component Value Date   WBC 12.0 (H) 12/27/2019   HGB 12.7 12/27/2019   HCT 38.7 12/27/2019   MCV 91 12/27/2019   PLT 269 12/27/2019   Lab Results  Component Value Date   NA 137 12/27/2019   K 4.7 12/27/2019   CO2 21 12/27/2019   GLUCOSE 163 (H) 12/27/2019   BUN 8 12/27/2019   CREATININE 0.77 12/27/2019   BILITOT 0.5 12/27/2019   ALKPHOS 63 12/27/2019   AST 21 12/27/2019   ALT 30 12/27/2019   PROT 6.2 12/27/2019   ALBUMIN 4.5 12/27/2019   CALCIUM 10.3 12/27/2019   ANIONGAP 12 04/09/2018   Lab Results  Component Value Date   CHOL 121 12/27/2019   Lab Results  Component Value Date   HDL 42 12/27/2019   Lab Results  Component Value Date   LDLCALC 58 12/27/2019   Lab Results  Component Value Date   TRIG 115 12/27/2019   Lab Results  Component Value Date   CHOLHDL 2.9 12/30/2015   Lab Results  Component Value Date   HGBA1C 8.2 (A) 08/24/2020       Assessment & Plan:  1. Peripheral edema Very mild, bilateral, peripheral lower extremity edema.  Add hydrochlorothiazide 12.5 mg tablets daily if needed.  Encouraged her to increase water intake.  Also encouraged her to elevate her feet when possible.  She should consider wearing compression socks or stockings when on her feet for long periods of time.  She voiced understanding and agreement. - hydrochlorothiazide  (HYDRODIURIL) 12.5 MG tablet; Take 1 tablet po QD prn edema  Dispense: 30 tablet; Refill: 2  2. Essential hypertension Blood pressure generally stable.  Continue medication as prescribed.  3. 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  Encouraged her to discuss new symptoms with spinal specialist in near future.  She voiced understanding and agreement.  4. Body mass index (BMI) of 35.0-35.9 in adult Encourage patient to limit calorie intake to 1500 cal/day.  She should consume a low-fat, low-cholesterol diet.  Encouraged her to gradually incorporate low impact exercise into her daily routine.  We will reassess at next visit.  5. Type 2 diabetes mellitus with hyperglycemia, without long-term current use of insulin ([place]) For now, continue all diabetic medications as prescribed.  Recheck hemoglobin A1c at next visit.  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 Rapid proofreading was performed to expedite the delivery of the information. Despite  proofreading, phonetic errors will occur which are common with this voice recognition software. Please take this into consideration. If there are any concerns, please contact our office.    Ronnell Freshwater, NP

## 2020-11-22 ENCOUNTER — Other Ambulatory Visit: Payer: Self-pay | Admitting: Nurse Practitioner

## 2020-11-22 DIAGNOSIS — I10 Essential (primary) hypertension: Secondary | ICD-10-CM

## 2020-11-22 DIAGNOSIS — R609 Edema, unspecified: Secondary | ICD-10-CM | POA: Insufficient documentation

## 2020-11-22 NOTE — Patient Instructions (Signed)

## 2020-11-23 ENCOUNTER — Other Ambulatory Visit: Payer: Self-pay | Admitting: Nurse Practitioner

## 2020-11-23 DIAGNOSIS — K219 Gastro-esophageal reflux disease without esophagitis: Secondary | ICD-10-CM

## 2020-12-14 ENCOUNTER — Other Ambulatory Visit: Payer: Self-pay | Admitting: Nurse Practitioner

## 2020-12-14 DIAGNOSIS — M5440 Lumbago with sciatica, unspecified side: Secondary | ICD-10-CM

## 2020-12-17 ENCOUNTER — Ambulatory Visit: Payer: TRICARE For Life (TFL) | Admitting: Nurse Practitioner

## 2020-12-20 ENCOUNTER — Other Ambulatory Visit: Payer: Self-pay | Admitting: Nurse Practitioner

## 2020-12-20 DIAGNOSIS — E782 Mixed hyperlipidemia: Secondary | ICD-10-CM

## 2020-12-24 ENCOUNTER — Other Ambulatory Visit: Payer: Self-pay | Admitting: Nurse Practitioner

## 2020-12-24 DIAGNOSIS — G4709 Other insomnia: Secondary | ICD-10-CM

## 2020-12-24 MED ORDER — ZOLPIDEM TARTRATE 10 MG PO TABS: 10.0000 mg | ORAL_TABLET | Freq: Every evening | ORAL | 0 refills | Status: DC | PRN

## 2020-12-26 ENCOUNTER — Other Ambulatory Visit: Payer: Self-pay | Admitting: Nurse Practitioner

## 2020-12-26 DIAGNOSIS — F411 Generalized anxiety disorder: Secondary | ICD-10-CM

## 2020-12-28 ENCOUNTER — Ambulatory Visit: Payer: TRICARE For Life (TFL) | Admitting: Nurse Practitioner

## 2021-01-25 ENCOUNTER — Encounter: Payer: Self-pay | Admitting: Nurse Practitioner

## 2021-01-25 ENCOUNTER — Other Ambulatory Visit: Payer: Self-pay | Admitting: Nurse Practitioner

## 2021-01-25 ENCOUNTER — Other Ambulatory Visit: Payer: Self-pay

## 2021-01-25 ENCOUNTER — Ambulatory Visit (INDEPENDENT_AMBULATORY_CARE_PROVIDER_SITE_OTHER): Payer: Medicare Other | Admitting: Nurse Practitioner

## 2021-01-25 VITALS — BP 117/64 | HR 68 | Temp 97.6°F | Ht 60.0 in | Wt 179.0 lb

## 2021-01-25 DIAGNOSIS — E039 Hypothyroidism, unspecified: Secondary | ICD-10-CM

## 2021-01-25 DIAGNOSIS — Z23 Encounter for immunization: Secondary | ICD-10-CM

## 2021-01-25 DIAGNOSIS — F322 Major depressive disorder, single episode, severe without psychotic features: Secondary | ICD-10-CM

## 2021-01-25 DIAGNOSIS — Z0000 Encounter for general adult medical examination without abnormal findings: Secondary | ICD-10-CM

## 2021-01-25 DIAGNOSIS — K219 Gastro-esophageal reflux disease without esophagitis: Secondary | ICD-10-CM

## 2021-01-25 DIAGNOSIS — E1165 Type 2 diabetes mellitus with hyperglycemia: Secondary | ICD-10-CM

## 2021-01-25 DIAGNOSIS — I10 Essential (primary) hypertension: Secondary | ICD-10-CM

## 2021-01-25 DIAGNOSIS — G4709 Other insomnia: Secondary | ICD-10-CM

## 2021-01-25 DIAGNOSIS — K580 Irritable bowel syndrome with diarrhea: Secondary | ICD-10-CM | POA: Diagnosis not present

## 2021-01-25 DIAGNOSIS — Z1231 Encounter for screening mammogram for malignant neoplasm of breast: Secondary | ICD-10-CM

## 2021-01-25 DIAGNOSIS — R110 Nausea: Secondary | ICD-10-CM

## 2021-01-25 DIAGNOSIS — M810 Age-related osteoporosis without current pathological fracture: Secondary | ICD-10-CM | POA: Diagnosis not present

## 2021-01-25 DIAGNOSIS — E782 Mixed hyperlipidemia: Secondary | ICD-10-CM | POA: Diagnosis not present

## 2021-01-25 MED ORDER — ALENDRONATE SODIUM 35 MG PO TABS: ORAL_TABLET | ORAL | 1 refills | Status: DC

## 2021-01-25 MED ORDER — DICYCLOMINE HCL 10 MG PO CAPS: 10.0000 mg | ORAL_CAPSULE | Freq: Three times a day (TID) | ORAL | Status: DC | PRN

## 2021-01-25 MED ORDER — PANTOPRAZOLE SODIUM 40 MG PO TBEC: 40.0000 mg | DELAYED_RELEASE_TABLET | Freq: Two times a day (BID) | ORAL | 1 refills | Status: DC

## 2021-01-25 MED ORDER — QUETIAPINE FUMARATE 50 MG PO TABS: 50.0000 mg | ORAL_TABLET | Freq: Three times a day (TID) | ORAL | 1 refills | Status: DC

## 2021-01-25 MED ORDER — LISINOPRIL 30 MG PO TABS: ORAL_TABLET | ORAL | 1 refills | Status: DC

## 2021-01-25 MED ORDER — LEVOTHYROXINE SODIUM 50 MCG PO TABS: ORAL_TABLET | ORAL | 1 refills | Status: DC

## 2021-01-25 MED ORDER — CITALOPRAM HYDROBROMIDE 40 MG PO TABS: 40.0000 mg | ORAL_TABLET | Freq: Every day | ORAL | 1 refills | Status: DC

## 2021-01-25 MED ORDER — PROMETHAZINE HCL 25 MG PO TABS: ORAL_TABLET | ORAL | 1 refills | Status: DC

## 2021-01-25 MED ORDER — ZOLPIDEM TARTRATE 10 MG PO TABS: 10.0000 mg | ORAL_TABLET | Freq: Every evening | ORAL | 1 refills | Status: DC | PRN

## 2021-01-25 MED ORDER — DILTIAZEM HCL ER COATED BEADS 240 MG PO CP24: ORAL_CAPSULE | ORAL | 1 refills | Status: DC

## 2021-01-25 NOTE — Progress Notes (Signed)
Subjective:   [name] is a 75 y.o. female who presents for Medicare Annual (Subsequent) preventive examination. She states that she has been having episodes of intestinal cramping along with diarrhea. She states that this can happen anytime. It usually occurs after eating. If she is out, she has to be aware of where the bathroom is. The need to have bowel movement is urgent. She states that if she does not get up to use the bathroom right away, it is likely she would have an accident. She states that the episodes are few and far between, but they are unpredictable.  She states that her blood sugars are stable. Her HgbA1c is 7.5 today, down from 8.2 at the last check.  Blood pressure is well managed.  She needs to have routine, fating labs drawn during today's visit .  Review of Systems    Review of Systems  Constitutional:  Negative for fever, malaise/fatigue and weight loss.  HENT:  Negative for congestion, ear discharge, ear pain, hearing loss and sore throat.   Eyes: Negative.   Respiratory:  Negative for cough, shortness of breath and wheezing.   Cardiovascular:  Negative for chest pain and orthopnea.  Gastrointestinal:  Positive for diarrhea. Negative for abdominal pain, blood in stool, constipation, nausea and vomiting.       Intermittent episodes of intestinal cramps and diarrhea.   Genitourinary:  Negative for dysuria, flank pain, frequency and urgency.  Musculoskeletal:  Negative for back pain and myalgias.  Skin:  Negative for itching and rash.  Neurological:  Negative for dizziness, weakness and headaches.  Endo/Heme/Allergies:        Improved blood sugars.   Psychiatric/Behavioral:  Negative for depression.          Objective:    Today's Vitals   01/25/21 1045  BP: 117/64  Pulse: 68  Temp: 97.6 F (36.4 C)  SpO2: 94%  Weight: 179 lb (81.2 kg)  Height: 5' (1.524 m)   Body mass index is 34.96 kg/m.  Physical Exam Vitals and nursing note reviewed.   Constitutional:      Appearance: Normal appearance. She is well-developed.  HENT:     Head: Normocephalic and atraumatic.     Right Ear: Tympanic membrane, ear canal and external ear normal.     Left Ear: Tympanic membrane, ear canal and external ear normal.     Nose: Nose normal.     Mouth/Throat:     Mouth: Mucous membranes are moist.     Pharynx: Oropharynx is clear.  Eyes:     Extraocular Movements: Extraocular movements intact.     Conjunctiva/sclera: Conjunctivae normal.     Pupils: Pupils are equal, round, and reactive to light.  Neck:     Vascular: No carotid bruit.  Cardiovascular:     Rate and Rhythm: Normal rate and regular rhythm.     Pulses:          Dorsalis pedis pulses are 1+ on the right side and 1+ on the left side.       Posterior tibial pulses are 1+ on the right side and 1+ on the left side.     Heart sounds: Normal heart sounds.  Pulmonary:     Effort: Pulmonary effort is normal.     Breath sounds: Normal breath sounds.  Abdominal:     General: Bowel sounds are normal. There is no distension.     Palpations: Abdomen is soft. There is no mass.  Tenderness: There is no abdominal tenderness. There is no guarding or rebound.     Hernia: No hernia is present.  Musculoskeletal:        General: Normal range of motion.     Cervical back: Normal range of motion and neck supple.     Right foot: Normal range of motion. No deformity, bunion or foot drop.     Left foot: Normal range of motion. No deformity, bunion or foot drop.  Feet:     Right foot:     Protective Sensation: 10 sites tested.  10 sites sensed.     Skin integrity: Skin integrity normal.     Toenail Condition: Right toenails are normal.     Left foot:     Protective Sensation: 10 sites tested.  10 sites sensed.     Skin integrity: Skin integrity normal.     Toenail Condition: Left toenails are normal.  Lymphadenopathy:     Cervical: No cervical adenopathy.  Skin:    General: Skin is warm and  dry.     Capillary Refill: Capillary refill takes less than 2 seconds.  Neurological:     General: No focal deficit present.     Mental Status: She is alert and oriented to person, place, and time.     Cranial Nerves: No cranial nerve deficit.     Sensory: No sensory deficit.     Motor: No weakness.     Coordination: Coordination normal.     Gait: Gait normal.  Psychiatric:        Mood and Affect: Mood normal.        Behavior: Behavior normal.        Thought Content: Thought content normal.        Judgment: Judgment normal.     Advanced Directives 10/28/2020 04/12/2019 03/04/2017 09/13/2016 09/12/2016 09/12/2016 03/13/2015  Does Patient Have a Medical Advance Directive? Yes Yes No No No No No  Type of Paramedic of [place];Living will [place]  Does patient want to make changes to medical advance directive? No - Guardian declined - - - - - -  Copy of [place] in Chart? Yes - validated most recent copy scanned in chart (See row information) - - - - - -  Would patient like information on creating a medical advance directive? - - - Yes (Inpatient - patient requests chaplain consult to create a medical advance directive) Yes (Inpatient - patient requests chaplain consult to create a medical advance directive) Yes (Inpatient - patient requests chaplain consult to create a medical advance directive) Yes - Educational materials given  Some encounter information is confidential and restricted. Go to Review Flowsheets activity to see all data.    Current Medications (verified) Outpatient Encounter Medications as of 01/25/2021  Medication Sig   aspirin 81 MG tablet Take 1 tablet by mouth daily.   atorvastatin (LIPITOR) 20 MG tablet TAKE 1 TABLET BY MOUTH EVERY DAY   busPIRone (BUSPAR) 10 MG tablet TAKE 1/2 TO 1 TABLET BY MOUTH TWICE DAILY   diclofenac Sodium (VOLTAREN) 1 % GEL Apply 4 g topically 4 (four) times daily.   dicyclomine  (BENTYL) 10 MG capsule Take 1 cpsule up to three times daily as needed for intestinal cramps and diarrhea.   FLUAD 0.5 ML SUSY ADM 0.5ML IM UTD   glucose blood test strip Blood glucose testing done daily and as needed   glucose monitoring kit (  FREESTYLE) monitoring kit Blood sugar testing to be done daily and as needed   hydrochlorothiazide (HYDRODIURIL) 12.5 MG tablet Take 1 tablet po QD prn edema   Lancets 30G MISC Blood glucose testing to be done daily ane as needed   meloxicam (MOBIC) 15 MG tablet Take 15 mg by mouth daily.   metFORMIN (GLUCOPHAGE) 850 MG tablet TAKE 1 TABLET (850 MG TOTAL) BY MOUTH 2 (TWO) TIMES DAILY WITH A MEAL.   metoprolol tartrate (LOPRESSOR) 25 MG tablet TAKE 1 TABLET BY MOUTH TWICE A DAY   rOPINIRole (REQUIP) 0.25 MG tablet Take 1 tablet (0.25 mg total) by mouth 2 times daily at 12 noon and 4 pm.   sucralfate (CARAFATE) 1 g tablet TAKE 1 TABLET BY MOUTH FOUR TIMES A DAY IF NEEDED   tiZANidine (ZANAFLEX) 2 MG tablet TAKE 1 TABLET BY MOUTH TWICE DAILY AS NEEDED FOR MUSCLE SPASMS   traMADol (ULTRAM) 50 MG tablet Take by mouth every 6 (six) hours as needed.   Zoster Vaccine Adjuvanted [facility]) injection Shingles vaccine series - inject IM as directed .   [DISCONTINUED] alendronate (FOSAMAX) 35 MG tablet TAKE 1 TABLET BY MOUTH EVERY 7 DAYS. TAKE WITH A FULL GLASS OF WATER ON AN EMPTY STOMACH.   [DISCONTINUED] citalopram (CELEXA) 40 MG tablet TAKE 1 TABLET BY MOUTH EVERY DAY   [DISCONTINUED] diltiazem (CARDIZEM CD) 240 MG 24 hr capsule TAKE 1 CAPSULE(240 MG) BY MOUTH DAILY   [DISCONTINUED] levothyroxine (SYNTHROID) 50 MCG tablet TAKE 1 TABLET BY MOUTH EVERY DAY IN THE MORNING   [DISCONTINUED] lisinopril (ZESTRIL) 30 MG tablet TAKE 1 TABLET BY MOUTH EVERY DAY   [DISCONTINUED] pantoprazole (PROTONIX) 40 MG tablet TAKE 1 TABLET BY MOUTH TWICE A DAY   [DISCONTINUED] promethazine (PHENERGAN) 25 MG tablet TAKE 1 TABLET BY MOUTH TWICE A DAY AS NEEDED FOR NAUSEA OR VOMITING    [DISCONTINUED] QUEtiapine (SEROQUEL) 50 MG tablet Take 1 tablet (50 mg total) by mouth 3 (three) times daily. May take 1 tablet in afternoons if needed for severe anxiety.   [DISCONTINUED] zolpidem (AMBIEN) 10 MG tablet Take 1 tablet (10 mg total) by mouth at bedtime as needed for sleep.   alendronate (FOSAMAX) 35 MG tablet TAKE 1 TABLET BY MOUTH EVERY 7 DAYS. TAKE WITH A FULL GLASS OF WATER ON AN EMPTY STOMACH.   citalopram (CELEXA) 40 MG tablet Take 1 tablet (40 mg total) by mouth daily.   diltiazem (CARDIZEM CD) 240 MG 24 hr capsule TAKE 1 CAPSULE(240 MG) BY MOUTH DAILY   levothyroxine (SYNTHROID) 50 MCG tablet TAKE 1 TABLET BY MOUTH EVERY DAY IN THE MORNING   lisinopril (ZESTRIL) 30 MG tablet TAKE 1 TABLET BY MOUTH EVERY DAY   pantoprazole (PROTONIX) 40 MG tablet Take 1 tablet (40 mg total) by mouth 2 (two) times daily.   promethazine (PHENERGAN) 25 MG tablet TAKE 1 TABLET BY MOUTH TWICE A DAY AS NEEDED FOR NAUSEA OR VOMITING   QUEtiapine (SEROQUEL) 50 MG tablet Take 1 tablet (50 mg total) by mouth 3 (three) times daily. May take 1 tablet in afternoons if needed for severe anxiety.   zolpidem (AMBIEN) 10 MG tablet Take 1 tablet (10 mg total) by mouth at bedtime as needed for sleep.   [DISCONTINUED] dicyclomine (BENTYL) capsule 10 mg    No facility-administered encounter medications on file as of 01/25/2021.    Allergies (verified) Patient has no known allergies.   History: Past Medical History:  Diagnosis Date   Anxiety    Depression  Diabetes mellitus, type II ([place])    GERD (gastroesophageal reflux disease)    Hypertension    Thyroid disease    Past Surgical History:  Procedure Laterality Date   ABDOMINAL HYSTERECTOMY     BREAST EXCISIONAL BIOPSY Bilateral 80s   benign   CATARACT EXTRACTION W/PHACO Right 10/14/2020   Procedure: CATARACT EXTRACTION PHACO AND INTRAOCULAR LENS PLACEMENT ([facility])RIGHT  DIABETIC 5.54 00:54.6;  Surgeon: Leandrew Koyanagi, MD;  Location: [place];  Service: Ophthalmology;  Laterality: Right;   CATARACT EXTRACTION W/PHACO Left 10/28/2020   Procedure: CATARACT EXTRACTION PHACO AND INTRAOCULAR LENS PLACEMENT ([place]) LEFT DIABETIC;  Surgeon: Leandrew Koyanagi, MD;  Location: [place];  Service: Ophthalmology;  Laterality: Left;  8.13 01:09.5   COLONOSCOPY WITH PROPOFOL N/A 04/12/2019   Procedure: COLONOSCOPY WITH PROPOFOL;  Surgeon: Jonathon Bellows, MD;  [facility] ENDOSCOPY;  Service: Gastroenterology;  Laterality: N/A;   JOINT REPLACEMENT Right 2019   Family History  Problem Relation Age of Onset   Anxiety disorder Mother    Colon cancer Mother    Heart disease Father    Anxiety disorder Father    Obesity Sister    Anxiety disorder Sister    Depression Sister    Osteoporosis Sister    Breast cancer Paternal Aunt    Social History   Socioeconomic History   Marital status: Widowed    Spouse name: Not on file   Number of children: Not on file   Years of education: Not on file   Highest education level: Not on file  Occupational History   Not on file  Tobacco Use   Smoking status: Never   Smokeless tobacco: Never  Vaping Use   Vaping Use: Never used  Substance and Sexual Activity   Alcohol use: No    Alcohol/week: 0.0 standard drinks   Drug use: Never   Sexual activity: Never  Other Topics Concern   Not on file  Social History Narrative   Not on file   Social Determinants of Health   Financial Resource Strain: Not on file  Food Insecurity: Not on file  Transportation Needs: Not on file  Physical Activity: Not on file  Stress: Not on file  Social Connections: Not on file    Tobacco Counseling Counseling given: Not Answered   Diabetic?yes   Activities of Daily Living In your present state of health, do you have any difficulty performing the following activities: 01/25/2021 11/13/2020  Hearing? N N  Vision? N N  Difficulty concentrating or making decisions? N N  Walking or  climbing stairs? Y Y  Dressing or bathing? N N  Doing errands, shopping? [place]pie Donning  Some recent data might be hidden    Patient Care Team: Ronnell Freshwater, NP as PCP - General (Family Medicine)     Assessment:  1. Encounter for Medicare annual wellness exam Annual medicare wellness visit today.   2. Type 2 diabetes mellitus with hyperglycemia, without long-term current use of insulin (HCC) hgbA1c 7.5 today. Continue diabetic medication as prescribed. Limit intake of sugar and carbohydrates. Recheck HgbA1c at next visit in three months.  - CBC with Differential/Platelet - Comprehensive metabolic panel - Hemoglobin A1c  3. Essential hypertension, benign Blood pressure stable. Continue medication as prescribed.  - diltiazem (CARDIZEM CD) 240 MG 24 hr capsule; TAKE 1 CAPSULE(240 MG) BY MOUTH DAILY  Dispense: 90 capsule; Refill: 1 - lisinopril (ZESTRIL) 30 MG tablet; TAKE 1 TABLET BY MOUTH EVERY DAY  Dispense: 90 tablet; Refill: 1  4. Acquired hypothyroidism Check thyroid panel and adjust levothyroxine dosing as indicated.  - T4, free - TSH - levothyroxine (SYNTHROID) 50 MCG tablet; TAKE 1 TABLET BY MOUTH EVERY DAY IN THE MORNING  Dispense: 90 tablet; Refill: 1  5. Mixed hyperlipidemia Check fasting lipid panel today.  - Lipid panel  6. Gastroesophageal reflux disease without esophagitis May continue pantoprazole as prescribed. New prescription sent to her pharmacy today.  - pantoprazole (PROTONIX) 40 MG tablet; Take 1 tablet (40 mg total) by mouth 2 (two) times daily.  Dispense: 180 tablet; Refill: 1  7. Other insomnia May continue totake ambien at bedtime as needed. A new prescription was sent to her pharmacy today.  - zolpidem (AMBIEN) 10 MG tablet; Take 1 tablet (10 mg total) by mouth at bedtime as needed for sleep.  Dispense: 90 tablet; Refill: 1  8. Depression, major, single episode, severe (HCC) Continue citalopram 53m daily. Take seroquel 521mthree times daily. New  prescriptions were sent to her pharmacy.  - QUEtiapine (SEROQUEL) 50 MG tablet; Take 1 tablet (50 mg total) by mouth 3 (three) times daily. May take 1 tablet in afternoons if needed for severe anxiety.  Dispense: 270 tablet; Refill: 1 - citalopram (CELEXA) 40 MG tablet; Take 1 tablet (40 mg total) by mouth daily.  Dispense: 90 tablet; Refill: 1  9. Age related osteoporosis, unspecified pathological fracture presence Continue fosamax weekly. Will get new bone density test for surveillance.  - alendronate (FOSAMAX) 35 MG tablet; TAKE 1 TABLET BY MOUTH EVERY 7 DAYS. TAKE WITH A FULL GLASS OF WATER ON AN EMPTY STOMACH.  Dispense: 12 tablet; Refill: 1 - DG Bone Density; Future  10. Irritable bowel syndrome with diarrhea Trial dicyclomine 1040mp to three times daily as needed for intestinal cramping and diarrhea. Reassess at her next visit in three months.  - dicyclomine (BENTYL) 10 MG capsule; Take 1 cpsule up to three times daily as needed for intestinal cramps and diarrhea.  Dispense: 90 capsule; Refill: 1  11. Nausea May take promethazine 81m18mice daily as needed for nausea.  - promethazine (PHENERGAN) 25 MG tablet; TAKE 1 TABLET BY MOUTH TWICE A DAY AS NEEDED FOR NAUSEA OR VOMITING  Dispense: 90 tablet; Refill: 1  12. Encounter for screening mammogram for malignant neoplasm of breast Screening mammogram ordered today.  - MM DIGITAL SCREENING BILATERAL; Future  13. Need for Tdap vaccination TDAP vaccination administered today.  - Tdap vaccine greater than or equal to 7yo IM  14. Healthcare maintenance Routine, fasting labs drawn during today's visit.  - CBC with Differential/Platelet - Comprehensive metabolic panel   Hearing/Vision screen No results found.  D Depression Screen PHQ 2/9 Scores 01/25/2021 11/13/2020 08/24/2020 06/29/2020 05/25/2020 04/07/2020 01/06/2020  PHQ - 2 Score 0 1 0 1 0 0 0  PHQ- 9 Score 0 4 0 2 0 - -    Fall Risk Fall Risk  01/25/2021 11/13/2020 08/24/2020  06/29/2020 05/25/2020  Falls in the past year? 0 0 1 0 0  Number falls in past yr: 0 0 0 0 0  Injury with Fall? 0 0 1 0 0  Follow up Falls evaluation completed Falls evaluation completed Falls evaluation completed Falls evaluation completed Falls evaluation completed    FALL RISK PREVENTION PERTAINING TO THE HOME:  Any stairs in or around the home? No  If so, are there any without handrails? No  Home free of loose throw rugs in walkways, pet beds, electrical cords, etc? Yes  Adequate lighting in  your home to reduce risk of falls? Yes   ASSISTIVE DEVICES UTILIZED TO PREVENT FALLS:  Life alert? No  Use of a cane, walker or w/c? Yes  Grab bars in the bathroom? No  Shower chair or bench in shower? No  Elevated toilet seat or a handicapped toilet? No   TIMED UP AND GO:  Was the test performed? Yes .  Length of time to ambulate 10 feet: 20 sec.   Gait slow and steady without use of assistive device  Cognitive Function: MMSE - Mini Mental State Exam 01/06/2020 12/28/2017  Orientation to time 5 5  Orientation to Place 5 5  Registration 3 3  Attention/ Calculation 5 5  Recall 3 3  Language- name 2 objects 2 2  Language- repeat 1 1  Language- follow 3 step command 3 3  Language- read & follow direction 1 1  Write a sentence 1 1  Copy design 1 0  Total score 30 29     6CIT Screen 01/25/2021  What Year? 0 points  What month? 0 points  What time? 0 points  Count back from 20 0 points  Months in reverse 0 points  Repeat phrase 2 points  Total Score 2    Immunizations Immunization History  Administered Date(s) Administered   Fluad Quad(high Dose 65+) 12/07/2018, 11/25/2019   Influenza, High Dose Seasonal PF 11/18/2015, 09/26/2018   Influenza-Unspecified 11/07/2017, 11/26/2020   PFIZER(Purple Top)SARS-COV-2 Vaccination 04/11/2019, 05/02/2019, 11/02/2019, 06/08/2020, 11/26/2020   PNEUMOCOCCAL CONJUGATE-20 08/09/2020   Pneumococcal Polysaccharide-23 10/20/2017   Tdap  01/25/2021   Zoster Recombinat (Shingrix) 04/01/2020, 06/08/2020   Zoster, Live 03/30/2020    TDAP status: Due, Education has been provided regarding the importance of this vaccine. Advised may receive this vaccine at local pharmacy or Health Dept. Aware to provide a copy of the vaccination record if obtained from local pharmacy or Health Dept. Verbalized acceptance and understanding.  Flu Vaccine status: Up to date  Pneumococcal vaccine status: Up to date  Covid-19 vaccine status: Completed vaccines  Qualifies for Shingles Vaccine? Yes   Zostavax completed Yes   Shingrix Completed?: Yes  Screening Tests Health Maintenance  Topic Date Due   FOOT EXAM  01/05/2021   OPHTHALMOLOGY EXAM  04/16/2021   HEMOGLOBIN A1C  07/26/2021   TETANUS/TDAP  01/26/2031   Pneumonia Vaccine 60+ Years old  Completed   INFLUENZA VACCINE  Completed   DEXA SCAN  Completed   COVID-19 Vaccine  Completed   Hepatitis C Screening  Completed   Zoster Vaccines- Shingrix  Completed   HPV VACCINES  Aged Out   COLONOSCOPY (Pts 45-46yr Insurance coverage will need to be confirmed)  Discontinued    Health Maintenance  Health Maintenance Due  Topic Date Due   FOOT EXAM  01/05/2021    Colorectal cancer screening: Type of screening: Colonoscopy. Completed n/a. Repeat every n/a years  Mammogram status: No longer required due to age out.  Bone Density status: Completed 2019. Results reflect: Bone density results: NORMAL. Repeat every n/a years.  Lung Cancer Screening: (Low Dose CT Chest recommended if Age 76-80years, 30 pack-year currently smoking OR have quit w/in 15years.) does not qualify.   Lung Cancer Screening Referral: n/a  Additional Screening:  Hepatitis C Screening: does not qualify; Completed 2017  Vision Screening: Recommended annual ophthalmology exams for early detection of glaucoma and other disorders of the eye. Is the patient up to date with their annual eye exam?  Yes  Who is the  provider  or what is the name of the office in which the patient attends annual eye exams? [facility] If pt is not established with a provider, would they like to be referred to a provider to establish care? No .   Dental Screening: Recommended annual dental exams for proper oral hygiene  Community Resource Referral / Chronic Care Management: CRR required this visit?  No   CCM required this visit?  No      Plan:     I have personally reviewed and noted the following in the patients chart:   Medical and social history Use of alcohol, tobacco or illicit drugs  Current medications and supplements including opioid prescriptions.  Functional ability and status Nutritional status Physical activity Advanced directives List of other physicians Hospitalizations, surgeries, and ER visits in previous 12 months Vitals Screenings to include cognitive, depression, and falls Referrals and appointments  In addition, I have reviewed and discussed with patient certain preventive protocols, quality metrics, and best practice recommendations. A written personalized care plan for preventive services as well as general preventive health recommendations were provided to patient.      Leretha Pol, FNP-c  01/27/2021

## 2021-01-26 LAB — CBC WITH DIFFERENTIAL/PLATELET
Basophils Absolute: 0.1 10*3/uL (ref 0.0–0.2)
Basos: 1 %
EOS (ABSOLUTE): 0.2 10*3/uL (ref 0.0–0.4)
Eos: 2 %
Hematocrit: 39.1 % (ref 34.0–46.6)
Hemoglobin: 12.6 g/dL (ref 11.1–15.9)
Immature Grans (Abs): 0 10*3/uL (ref 0.0–0.1)
Immature Granulocytes: 0 %
Lymphocytes Absolute: 4.4 10*3/uL — ABNORMAL HIGH (ref 0.7–3.1)
Lymphs: 43 %
MCH: 29 pg (ref 26.6–33.0)
MCHC: 32.2 g/dL (ref 31.5–35.7)
MCV: 90 fL (ref 79–97)
Monocytes Absolute: 0.5 10*3/uL (ref 0.1–0.9)
Monocytes: 5 %
Neutrophils Absolute: 5 10*3/uL (ref 1.4–7.0)
Neutrophils: 49 %
Platelets: 323 10*3/uL (ref 150–450)
RBC: 4.35 x10E6/uL (ref 3.77–5.28)
RDW: 12.1 % (ref 11.7–15.4)
WBC: 10.3 10*3/uL (ref 3.4–10.8)

## 2021-01-26 LAB — COMPREHENSIVE METABOLIC PANEL
ALT: 41 IU/L — ABNORMAL HIGH (ref 0–32)
AST: 45 IU/L — ABNORMAL HIGH (ref 0–40)
Albumin/Globulin Ratio: 2.5 — ABNORMAL HIGH (ref 1.2–2.2)
Albumin: 4.5 g/dL (ref 3.7–4.7)
Alkaline Phosphatase: 60 IU/L (ref 44–121)
BUN/Creatinine Ratio: 8 — ABNORMAL LOW (ref 12–28)
BUN: 6 mg/dL — ABNORMAL LOW (ref 8–27)
Bilirubin Total: 0.4 mg/dL (ref 0.0–1.2)
CO2: 22 mmol/L (ref 20–29)
Calcium: 10 mg/dL (ref 8.7–10.3)
Chloride: 98 mmol/L (ref 96–106)
Creatinine, Ser: 0.75 mg/dL (ref 0.57–1.00)
Globulin, Total: 1.8 g/dL (ref 1.5–4.5)
Glucose: 198 mg/dL — ABNORMAL HIGH (ref 70–99)
Potassium: 4.5 mmol/L (ref 3.5–5.2)
Sodium: 138 mmol/L (ref 134–144)
Total Protein: 6.3 g/dL (ref 6.0–8.5)
eGFR: 82 mL/min/{1.73_m2} (ref >59)

## 2021-01-26 LAB — HEMOGLOBIN A1C
Est. average glucose Bld gHb Est-mCnc: 169 mg/dL
Hgb A1c MFr Bld: 7.5 % — ABNORMAL HIGH (ref 4.8–5.6)

## 2021-01-26 LAB — LIPID PANEL
Chol/HDL Ratio: 5.2 ratio — ABNORMAL HIGH (ref 0.0–4.4)
Cholesterol, Total: 192 mg/dL (ref 100–199)
HDL: 37 mg/dL — ABNORMAL LOW (ref >39)
LDL Chol Calc (NIH): 120 mg/dL — ABNORMAL HIGH (ref 0–99)
Triglycerides: 199 mg/dL — ABNORMAL HIGH (ref 0–149)
VLDL Cholesterol Cal: 35 mg/dL (ref 5–40)

## 2021-01-26 LAB — TSH: TSH: 0.827 u[IU]/mL (ref 0.450–4.500)

## 2021-01-26 LAB — T4, FREE: Free T4: 1.25 ng/dL (ref 0.82–1.77)

## 2021-01-27 DIAGNOSIS — K580 Irritable bowel syndrome with diarrhea: Secondary | ICD-10-CM | POA: Insufficient documentation

## 2021-01-27 MED ORDER — DICYCLOMINE HCL 10 MG PO CAPS: ORAL_CAPSULE | ORAL | 1 refills | Status: DC

## 2021-01-27 NOTE — Progress Notes (Signed)
Please let the patient know that her labs are back. Her HgbA1c improved. It is now 7.5, down from 8.2 at the last check. Her cholesterol and her liver enzymes were both elevated. I'm not sure if this is because of steroid injections she has been getting. What I would liek to do is recheck these at her next visit. This would be a fasting lipid and CMP at her next check. Her other labs were good.  Thanks so much.   -HB

## 2021-01-27 NOTE — Progress Notes (Signed)
It should be sent now. I accidentally had it in as a clinic administered medication. I have taken that one out and added in the dicyclomine 10mg  capsules. She can take this up to three times daily if needed for intestinal cramping and diarrhea. Sent to [facility] in [place].

## 2021-02-25 ENCOUNTER — Other Ambulatory Visit: Payer: Self-pay | Admitting: Nurse Practitioner

## 2021-02-25 DIAGNOSIS — K580 Irritable bowel syndrome with diarrhea: Secondary | ICD-10-CM

## 2021-03-07 ENCOUNTER — Other Ambulatory Visit: Payer: Self-pay | Admitting: Nurse Practitioner

## 2021-03-07 DIAGNOSIS — K580 Irritable bowel syndrome with diarrhea: Secondary | ICD-10-CM

## 2021-03-09 ENCOUNTER — Ambulatory Visit (INDEPENDENT_AMBULATORY_CARE_PROVIDER_SITE_OTHER): Payer: Medicare Other | Admitting: Nurse Practitioner

## 2021-03-09 ENCOUNTER — Other Ambulatory Visit: Payer: Self-pay

## 2021-03-09 ENCOUNTER — Encounter: Payer: Self-pay | Admitting: Nurse Practitioner

## 2021-03-09 VITALS — BP 139/73 | HR 62 | Temp 97.4°F | Ht 60.0 in | Wt 182.0 lb

## 2021-03-09 DIAGNOSIS — K580 Irritable bowel syndrome with diarrhea: Secondary | ICD-10-CM | POA: Diagnosis not present

## 2021-03-09 DIAGNOSIS — E1165 Type 2 diabetes mellitus with hyperglycemia: Secondary | ICD-10-CM

## 2021-03-09 DIAGNOSIS — I10 Essential (primary) hypertension: Secondary | ICD-10-CM

## 2021-03-09 DIAGNOSIS — Z6835 Body mass index (BMI) 35.0-35.9, adult: Secondary | ICD-10-CM

## 2021-03-09 MED ORDER — LOPERAMIDE HCL 2 MG PO CAPS: 4.0000 mg | ORAL_CAPSULE | ORAL | 2 refills | Status: DC | PRN

## 2021-03-09 NOTE — Progress Notes (Signed)
Established patient visit   Patient: [name]   DOB: Jul 03, 1945   76 y.o. Female  MRN: [id number] Visit Date: 03/09/2021   Chief Complaint  Patient presents with   Medication Problem   Subjective    HPI  The patient was started on bentyl to try and help with irritable bowel, mostly with diarrhea. This does alternate with constipation. She states that it is about a 50/50 ratio. She states that every few weeks she has significant diarrhea. At that time she has several loose stools a day, lasting for several days. She is unable to go out of the house when this is happening because she can't be sure she will be around a restroom. She states that during these episodes, she gets a bit of gas, but no real cramping or abdominal pain. She states that trial of bentryl did not work at all. What works best during these episodes is imodium. During episodes of diarrhea, she has to take three or 4 times a day.    Medications: Outpatient Medications Prior to Visit  Medication Sig   alendronate (FOSAMAX) 35 MG tablet TAKE 1 TABLET BY MOUTH EVERY 7 DAYS. TAKE WITH A FULL GLASS OF WATER ON AN EMPTY STOMACH.   aspirin 81 MG tablet Take 1 tablet by mouth daily.   atorvastatin (LIPITOR) 20 MG tablet TAKE 1 TABLET BY MOUTH EVERY DAY   busPIRone (BUSPAR) 10 MG tablet TAKE 1/2 TO 1 TABLET BY MOUTH TWICE DAILY   citalopram (CELEXA) 40 MG tablet Take 1 tablet (40 mg total) by mouth daily.   diclofenac Sodium (VOLTAREN) 1 % GEL Apply 4 g topically 4 (four) times daily.   diltiazem (CARDIZEM CD) 240 MG 24 hr capsule TAKE 1 CAPSULE(240 MG) BY MOUTH DAILY   FLUAD 0.5 ML SUSY ADM 0.5ML IM UTD   glucose blood test strip Blood glucose testing done daily and as needed   glucose monitoring kit (FREESTYLE) monitoring kit Blood sugar testing to be done daily and as needed   hydrochlorothiazide (HYDRODIURIL) 12.5 MG tablet Take 1 tablet po QD prn edema   Lancets 30G MISC Blood glucose testing to be done daily ane as  needed   levothyroxine (SYNTHROID) 50 MCG tablet TAKE 1 TABLET BY MOUTH EVERY DAY IN THE MORNING   lisinopril (ZESTRIL) 30 MG tablet TAKE 1 TABLET BY MOUTH EVERY DAY   meloxicam (MOBIC) 15 MG tablet Take 15 mg by mouth daily.   metFORMIN (GLUCOPHAGE) 850 MG tablet TAKE 1 TABLET (850 MG TOTAL) BY MOUTH 2 (TWO) TIMES DAILY WITH A MEAL.   metoprolol tartrate (LOPRESSOR) 25 MG tablet TAKE 1 TABLET BY MOUTH TWICE A DAY   pantoprazole (PROTONIX) 40 MG tablet Take 1 tablet (40 mg total) by mouth 2 (two) times daily.   promethazine (PHENERGAN) 25 MG tablet TAKE 1 TABLET BY MOUTH TWICE A DAY AS NEEDED FOR NAUSEA OR VOMITING   QUEtiapine (SEROQUEL) 50 MG tablet Take 1 tablet (50 mg total) by mouth 3 (three) times daily. May take 1 tablet in afternoons if needed for severe anxiety.   rOPINIRole (REQUIP) 0.25 MG tablet Take 1 tablet (0.25 mg total) by mouth 2 times daily at 12 noon and 4 pm.   sucralfate (CARAFATE) 1 g tablet TAKE 1 TABLET BY MOUTH FOUR TIMES A DAY IF NEEDED   tiZANidine (ZANAFLEX) 2 MG tablet TAKE 1 TABLET BY MOUTH TWICE DAILY AS NEEDED FOR MUSCLE SPASMS   traMADol (ULTRAM) 50 MG tablet Take by mouth every 6 (  six) hours as needed.   zolpidem (AMBIEN) 10 MG tablet Take 1 tablet (10 mg total) by mouth at bedtime as needed for sleep.   Zoster Vaccine Adjuvanted [facility]) injection Shingles vaccine series - inject IM as directed .   [DISCONTINUED] dicyclomine (BENTYL) 10 MG capsule TAKE 1 CAPSULE UP TO THREE TIMES DAILY AS NEEDED FOR INTESTINAL CRAMPS AND DIARRHEA.   No facility-administered medications prior to visit.    Review of Systems  Constitutional:  Positive for fatigue. Negative for activity change, appetite change, chills and fever.  HENT:  Negative for congestion, postnasal drip, rhinorrhea, sinus pressure, sinus pain, sneezing and sore throat.   Eyes: Negative.   Respiratory:  Negative for cough, chest tightness, shortness of breath and wheezing.   Cardiovascular:  Negative  for chest pain and palpitations.  Gastrointestinal:  Positive for diarrhea. Negative for abdominal pain, constipation, nausea and vomiting.  Endocrine: Negative for cold intolerance, heat intolerance, polydipsia and polyuria.       Blood sugars doing well    Genitourinary:  Negative for dyspareunia, dysuria, flank pain, frequency and urgency.  Musculoskeletal:  Negative for arthralgias, back pain and myalgias.  Skin:  Negative for rash.  Allergic/Immunologic: Negative for environmental allergies.  Neurological:  Negative for dizziness, weakness and headaches.  Hematological:  Negative for adenopathy.  Psychiatric/Behavioral:  The patient is not nervous/anxious.      Objective     Today's Vitals   03/09/21 1037  BP: 139/73  Pulse: 62  Temp: (!) 97.4 F (36.3 C)  SpO2: 96%  Weight: 182 lb (82.6 kg)  Height: 5' (1.524 m)   Body mass index is 35.54 kg/m.   BP Readings from Last 3 Encounters:  03/09/21 139/73  01/25/21 117/64  11/13/20 119/71    Wt Readings from Last 3 Encounters:  03/09/21 182 lb (82.6 kg)  01/25/21 179 lb (81.2 kg)  11/13/20 180 lb 1.9 oz (81.7 kg)    Physical Exam Vitals and nursing note reviewed.  Constitutional:      Appearance: Normal appearance. She is well-developed. She is obese.  HENT:     Head: Normocephalic and atraumatic.     Nose: Nose normal.     Mouth/Throat:     Mouth: Mucous membranes are moist.     Pharynx: Oropharynx is clear.  Eyes:     Extraocular Movements: Extraocular movements intact.     Conjunctiva/sclera: Conjunctivae normal.     Pupils: Pupils are equal, round, and reactive to light.  Cardiovascular:     Rate and Rhythm: Normal rate and regular rhythm.     Pulses: Normal pulses.     Heart sounds: Normal heart sounds.  Pulmonary:     Effort: Pulmonary effort is normal.     Breath sounds: Normal breath sounds.  Abdominal:     General: Bowel sounds are increased.     Palpations: Abdomen is soft.     Tenderness: There  is no abdominal tenderness.  Musculoskeletal:        General: Normal range of motion.     Cervical back: Normal range of motion and neck supple.  Lymphadenopathy:     Cervical: No cervical adenopathy.  Skin:    General: Skin is warm and dry.     Capillary Refill: Capillary refill takes less than 2 seconds.  Neurological:     General: No focal deficit present.     Mental Status: She is alert and oriented to person, place, and time.  Psychiatric:  Mood and Affect: Mood normal.        Behavior: Behavior normal.        Thought Content: Thought content normal.        Judgment: Judgment normal.      Assessment & Plan    1. Irritable bowel syndrome with diarrhea Prescription for Imodium 2 mg.  May take 1 to 2 capsules or tablets as needed and as indicated for loose stools.  Refer to GI for further evaluation. - loperamide (IMODIUM) 2 MG capsule; Take 2 capsules (4 mg total) by mouth as needed for diarrhea or loose stools.  Dispense: 90 capsule; Refill: 2 - Ambulatory referral to Gastroenterology  2. Type 2 diabetes mellitus with hyperglycemia, without long-term current use of insulin (HCC) Stable.  Continue diabetic medication as prescribed.  3. Essential hypertension, benign Stable.  Continue blood pressure medication as prescribed.  4. Body mass index (BMI) of 35.0-35.9 in adult Discussed lowering calorie intake to 1500 calories per day and incorporating exercise into daily routine to help lose weight.      Problem List Items Addressed This Visit       Cardiovascular and Mediastinum   Essential hypertension, benign     Digestive   Irritable bowel syndrome with diarrhea - Primary   Relevant Medications   loperamide (IMODIUM) 2 MG capsule   Other Relevant Orders   Ambulatory referral to Gastroenterology     Endocrine   Type 2 diabetes mellitus with hyperglycemia, without long-term current use of insulin (HCC)     Other   Body mass index (BMI) of 35.0-35.9 in adult      Return in 2 months (on 05/07/2021) for diabetes with HgbA1c check.         Ronnell Freshwater, NP  [facility] at [facility] [phone] (phone) [phone] (fax)  [place]

## 2021-03-14 NOTE — Patient Instructions (Signed)
Fat and Cholesterol Restricted Eating Plan Getting too much fat and cholesterol in your diet may cause health problems. Choosing the right foods helps keep your fat and cholesterol at normal levels. This can keep you from getting certain diseases. Your doctor may recommend an eating plan that includes: Total fat: ______% or less of total calories a day. This is ______g of fat a day. Saturated fat: ______% or less of total calories a day. This is ______g of saturated fat a day. Cholesterol: less than _________mg a day. Fiber: ______g a day. What are tips for following this plan? General tips Work with your doctor to lose weight if you need to. Avoid: Foods with added sugar. Fried foods. Foods with trans fat or partially hydrogenated oils. This includes some margarines and baked goods. If you drink alcohol: Limit how much you have to: 0-1 drink a day for women who are not pregnant. 0-2 drinks a day for men. Know how much alcohol is in a drink. In the [place] one drink equals one 12 oz bottle of beer (355 mL), one 5 oz glass of wine (148 mL), or one 1 oz glass of hard liquor (44 mL). Reading food labels Check food labels for: Trans fats. Partially hydrogenated oils. Saturated fat (g) in each serving. Cholesterol (mg) in each serving. Fiber (g) in each serving. Choose foods with healthy fats, such as: Monounsaturated fats and polyunsaturated fats. These include olive and canola oil, flaxseeds, walnuts, almonds, and seeds. Omega-3 fats. These are found in certain fish, flaxseed oil, and ground flaxseeds. Choose grain products that have whole grains. Look for the word "whole" as the first word in the ingredient list. Cooking Cook foods using low-fat methods. These include baking, boiling, grilling, and broiling. Eat more home-cooked foods. Eat at restaurants and buffets less often. Eat less fast food. Avoid cooking using saturated fats, such as butter, cream, palm oil, palm kernel oil, and  coconut oil. Meal planning  At meals, divide your plate into four equal parts: Fill one-half of your plate with vegetables, green salads, and fruit. Fill one-fourth of your plate with whole grains. Fill one-fourth of your plate with low-fat (lean) protein foods. Eat fish that is high in omega-3 fats at least two times a week. This includes mackerel, tuna, sardines, and salmon. Eat foods that are high in fiber, such as whole grains, beans, apples, pears, berries, broccoli, carrots, peas, and barley. What foods should I eat? Fruits All fresh, canned (in natural juice), or frozen fruits. Vegetables Fresh or frozen vegetables (raw, steamed, roasted, or grilled). Green salads. Grains Whole grains, such as whole wheat or whole grain breads, crackers, cereals, and pasta. Unsweetened oatmeal, bulgur, barley, quinoa, or brown rice. Corn or whole wheat flour tortillas. Meats and other protein foods Ground beef (85% or leaner), grass-fed beef, or beef trimmed of fat. Skinless chicken or turkey. Ground chicken or turkey. Pork trimmed of fat. All fish and seafood. Egg whites. Dried beans, peas, or lentils. Unsalted nuts or seeds. Unsalted canned beans. Nut butters without added sugar or oil. Dairy Low-fat or nonfat dairy products, such as skim or 1% milk, 2% or reduced-fat cheeses, low-fat and fat-free ricotta or cottage cheese, or plain low-fat and nonfat yogurt. Fats and oils Tub margarine without trans fats. Light or reduced-fat mayonnaise and salad dressings. Avocado. Olive, canola, sesame, or safflower oils. The items listed above may not be a complete list of foods and beverages you can eat. Contact a dietitian for more information. What foods   should I avoid? Fruits Canned fruit in heavy syrup. Fruit in cream or butter sauce. Fried fruit. Vegetables Vegetables cooked in cheese, cream, or butter sauce. Fried vegetables. Grains White bread. White pasta. White rice. Cornbread. Bagels, pastries,  and croissants. Crackers and snack foods that contain trans fat and hydrogenated oils. Meats and other protein foods Fatty cuts of meat. Ribs, chicken wings, bacon, sausage, bologna, salami, chitterlings, fatback, hot dogs, bratwurst, and packaged lunch meats. Liver and organ meats. Whole eggs and egg yolks. Chicken and turkey with skin. Fried meat. Dairy Whole or 2% milk, cream, half-and-half, and cream cheese. Whole milk cheeses. Whole-fat or sweetened yogurt. Full-fat cheeses. Nondairy creamers and whipped toppings. Processed cheese, cheese spreads, and cheese curds. Fats and oils Butter, stick margarine, lard, shortening, ghee, or bacon fat. Coconut, palm kernel, and palm oils. Beverages Alcohol. Sugar-sweetened drinks such as sodas, lemonade, and fruit drinks. Sweets and desserts Corn syrup, sugars, honey, and molasses. Candy. Jam and jelly. Syrup. Sweetened cereals. Cookies, pies, cakes, donuts, muffins, and ice cream. The items listed above may not be a complete list of foods and beverages you should avoid. Contact a dietitian for more information. Summary Choosing the right foods helps keep your fat and cholesterol at normal levels. This can keep you from getting certain diseases. At meals, fill one-half of your plate with vegetables, green salads, and fruits. Eat high fiber foods, like whole grains, beans, apples, pears, berries, carrots, peas, and barley. Limit added sugar, saturated fats, alcohol, and fried foods. This information is not intended to replace advice given to you by your health care provider. Make sure you discuss any questions you have with your health care provider. Document Revised: 06/05/2020 Document Reviewed: 06/05/2020 Elsevier Patient Education  2022 Elsevier Inc.  

## 2021-04-27 ENCOUNTER — Other Ambulatory Visit: Payer: Self-pay

## 2021-04-27 ENCOUNTER — Encounter: Payer: Self-pay | Admitting: Nurse Practitioner

## 2021-04-27 ENCOUNTER — Ambulatory Visit (INDEPENDENT_AMBULATORY_CARE_PROVIDER_SITE_OTHER): Payer: Medicare Other | Admitting: Nurse Practitioner

## 2021-04-27 VITALS — BP 136/68 | HR 62 | Temp 97.6°F | Ht 60.0 in | Wt 182.1 lb

## 2021-04-27 DIAGNOSIS — E1165 Type 2 diabetes mellitus with hyperglycemia: Secondary | ICD-10-CM | POA: Diagnosis not present

## 2021-04-27 DIAGNOSIS — K219 Gastro-esophageal reflux disease without esophagitis: Secondary | ICD-10-CM

## 2021-04-27 DIAGNOSIS — E039 Hypothyroidism, unspecified: Secondary | ICD-10-CM

## 2021-04-27 DIAGNOSIS — G4709 Other insomnia: Secondary | ICD-10-CM | POA: Diagnosis not present

## 2021-04-27 DIAGNOSIS — R748 Abnormal levels of other serum enzymes: Secondary | ICD-10-CM

## 2021-04-27 DIAGNOSIS — Z6835 Body mass index (BMI) 35.0-35.9, adult: Secondary | ICD-10-CM

## 2021-04-27 LAB — POCT GLYCOSYLATED HEMOGLOBIN (HGB A1C): Hemoglobin A1C: 7.9 % — AB (ref 4.0–5.6)

## 2021-04-27 MED ORDER — ZOLPIDEM TARTRATE 10 MG PO TABS: 10.0000 mg | ORAL_TABLET | Freq: Every evening | ORAL | 1 refills | Status: DC | PRN

## 2021-04-27 MED ORDER — SUCRALFATE 1 G PO TABS: ORAL_TABLET | ORAL | 1 refills | Status: DC

## 2021-04-27 NOTE — Patient Instructions (Signed)
Fat and Cholesterol Restricted Eating Plan Getting too much fat and cholesterol in your diet may cause health problems. Choosing the right foods helps keep your fat and cholesterol at normal levels. This can keep you from getting certain diseases. Your doctor may recommend an eating plan that includes: Total fat: ______% or less of total calories a day. This is ______g of fat a day. Saturated fat: ______% or less of total calories a day. This is ______g of saturated fat a day. Cholesterol: less than _________mg a day. Fiber: ______g a day. What are tips for following this plan? General tips Work with your doctor to lose weight if you need to. Avoid: Foods with added sugar. Fried foods. Foods with trans fat or partially hydrogenated oils. This includes some margarines and baked goods. If you drink alcohol: Limit how much you have to: 0-1 drink a day for women who are not pregnant. 0-2 drinks a day for men. Know how much alcohol is in a drink. In the [place] one drink equals one 12 oz bottle of beer (355 mL), one 5 oz glass of wine (148 mL), or one 1 oz glass of hard liquor (44 mL). Reading food labels Check food labels for: Trans fats. Partially hydrogenated oils. Saturated fat (g) in each serving. Cholesterol (mg) in each serving. Fiber (g) in each serving. Choose foods with healthy fats, such as: Monounsaturated fats and polyunsaturated fats. These include olive and canola oil, flaxseeds, walnuts, almonds, and seeds. Omega-3 fats. These are found in certain fish, flaxseed oil, and ground flaxseeds. Choose grain products that have whole grains. Look for the word "whole" as the first word in the ingredient list. Cooking Cook foods using low-fat methods. These include baking, boiling, grilling, and broiling. Eat more home-cooked foods. Eat at restaurants and buffets less often. Eat less fast food. Avoid cooking using saturated fats, such as butter, cream, palm oil, palm kernel oil, and  coconut oil. Meal planning  At meals, divide your plate into four equal parts: Fill one-half of your plate with vegetables, green salads, and fruit. Fill one-fourth of your plate with whole grains. Fill one-fourth of your plate with low-fat (lean) protein foods. Eat fish that is high in omega-3 fats at least two times a week. This includes mackerel, tuna, sardines, and salmon. Eat foods that are high in fiber, such as whole grains, beans, apples, pears, berries, broccoli, carrots, peas, and barley. What foods should I eat? Fruits All fresh, canned (in natural juice), or frozen fruits. Vegetables Fresh or frozen vegetables (raw, steamed, roasted, or grilled). Green salads. Grains Whole grains, such as whole wheat or whole grain breads, crackers, cereals, and pasta. Unsweetened oatmeal, bulgur, barley, quinoa, or brown rice. Corn or whole wheat flour tortillas. Meats and other protein foods Ground beef (85% or leaner), grass-fed beef, or beef trimmed of fat. Skinless chicken or turkey. Ground chicken or turkey. Pork trimmed of fat. All fish and seafood. Egg whites. Dried beans, peas, or lentils. Unsalted nuts or seeds. Unsalted canned beans. Nut butters without added sugar or oil. Dairy Low-fat or nonfat dairy products, such as skim or 1% milk, 2% or reduced-fat cheeses, low-fat and fat-free ricotta or cottage cheese, or plain low-fat and nonfat yogurt. Fats and oils Tub margarine without trans fats. Light or reduced-fat mayonnaise and salad dressings. Avocado. Olive, canola, sesame, or safflower oils. The items listed above may not be a complete list of foods and beverages you can eat. Contact a dietitian for more information. What foods   should I avoid? Fruits Canned fruit in heavy syrup. Fruit in cream or butter sauce. Fried fruit. Vegetables Vegetables cooked in cheese, cream, or butter sauce. Fried vegetables. Grains White bread. White pasta. White rice. Cornbread. Bagels, pastries,  and croissants. Crackers and snack foods that contain trans fat and hydrogenated oils. Meats and other protein foods Fatty cuts of meat. Ribs, chicken wings, bacon, sausage, bologna, salami, chitterlings, fatback, hot dogs, bratwurst, and packaged lunch meats. Liver and organ meats. Whole eggs and egg yolks. Chicken and turkey with skin. Fried meat. Dairy Whole or 2% milk, cream, half-and-half, and cream cheese. Whole milk cheeses. Whole-fat or sweetened yogurt. Full-fat cheeses. Nondairy creamers and whipped toppings. Processed cheese, cheese spreads, and cheese curds. Fats and oils Butter, stick margarine, lard, shortening, ghee, or bacon fat. Coconut, palm kernel, and palm oils. Beverages Alcohol. Sugar-sweetened drinks such as sodas, lemonade, and fruit drinks. Sweets and desserts Corn syrup, sugars, honey, and molasses. Candy. Jam and jelly. Syrup. Sweetened cereals. Cookies, pies, cakes, donuts, muffins, and ice cream. The items listed above may not be a complete list of foods and beverages you should avoid. Contact a dietitian for more information. Summary Choosing the right foods helps keep your fat and cholesterol at normal levels. This can keep you from getting certain diseases. At meals, fill one-half of your plate with vegetables, green salads, and fruits. Eat high fiber foods, like whole grains, beans, apples, pears, berries, carrots, peas, and barley. Limit added sugar, saturated fats, alcohol, and fried foods. This information is not intended to replace advice given to you by your health care provider. Make sure you discuss any questions you have with your health care provider. Document Revised: 06/05/2020 Document Reviewed: 06/05/2020 Elsevier Patient Education  2022 Elsevier Inc.  

## 2021-04-27 NOTE — Progress Notes (Signed)
Established patient visit   Patient: [name]   DOB: 02-Oct-1945   76 y.o. Female  MRN: [id number] Visit Date: 04/27/2021  Today's healthcare provider: Carlean Jews, NP   Chief Complaint  Patient presents with   Follow-up   Diabetes   Subjective    HPI  The patient is here for routine follow up.  -improved IBS - Diarrhea. Has only had to take imodium once or twice. Now having some intermittent constipation. Will take miralax when needed. Has approved referral to GI and waiting to have initial appointment.  -will check HgbA1c. Patient has not been checking blood sugars. States that she feels well.  -concern that she has not been losing weight. Has been on same dose levothyroxine for several years. Last check was WNL which was 01/2021.  -elevated blood ugars with HgbA1c 7.9 denies chest pain, chest pressure, or shortness of breath. She denies headaches or visual disturbances. She denies abdominal pain, nausea, vomiting, or changes in bowel or bladder habits.    Medications: Outpatient Medications Prior to Visit  Medication Sig   alendronate (FOSAMAX) 35 MG tablet TAKE 1 TABLET BY MOUTH EVERY 7 DAYS. TAKE WITH A FULL GLASS OF WATER ON AN EMPTY STOMACH.   aspirin 81 MG tablet Take 1 tablet by mouth daily.   atorvastatin (LIPITOR) 20 MG tablet TAKE 1 TABLET BY MOUTH EVERY DAY   busPIRone (BUSPAR) 10 MG tablet TAKE 1/2 TO 1 TABLET BY MOUTH TWICE DAILY   citalopram (CELEXA) 40 MG tablet Take 1 tablet (40 mg total) by mouth daily.   diclofenac Sodium (VOLTAREN) 1 % GEL Apply 4 g topically 4 (four) times daily.   diltiazem (CARDIZEM CD) 240 MG 24 hr capsule TAKE 1 CAPSULE(240 MG) BY MOUTH DAILY   FLUAD 0.5 ML SUSY ADM 0.5ML IM UTD   FLUAD QUADRIVALENT 0.5 ML injection    glucose blood test strip Blood glucose testing done daily and as needed   glucose monitoring kit (FREESTYLE) monitoring kit Blood sugar testing to be done daily and as needed   hydrochlorothiazide  (HYDRODIURIL) 12.5 MG tablet Take 1 tablet po QD prn edema   Lancets 30G MISC Blood glucose testing to be done daily ane as needed   levothyroxine (SYNTHROID) 50 MCG tablet TAKE 1 TABLET BY MOUTH EVERY DAY IN THE MORNING   lisinopril (ZESTRIL) 30 MG tablet TAKE 1 TABLET BY MOUTH EVERY DAY   loperamide (IMODIUM) 2 MG capsule Take 2 capsules (4 mg total) by mouth as needed for diarrhea or loose stools.   meloxicam (MOBIC) 15 MG tablet Take 15 mg by mouth daily.   metFORMIN (GLUCOPHAGE) 850 MG tablet TAKE 1 TABLET (850 MG TOTAL) BY MOUTH 2 (TWO) TIMES DAILY WITH A MEAL.   metoprolol tartrate (LOPRESSOR) 25 MG tablet TAKE 1 TABLET BY MOUTH TWICE A DAY   pantoprazole (PROTONIX) 40 MG tablet Take 1 tablet (40 mg total) by mouth 2 (two) times daily.   PFIZER COVID-19 VAC BIVALENT injection    promethazine (PHENERGAN) 25 MG tablet TAKE 1 TABLET BY MOUTH TWICE A DAY AS NEEDED FOR NAUSEA OR VOMITING   QUEtiapine (SEROQUEL) 50 MG tablet Take 1 tablet (50 mg total) by mouth 3 (three) times daily. May take 1 tablet in afternoons if needed for severe anxiety.   rOPINIRole (REQUIP) 0.25 MG tablet Take 1 tablet (0.25 mg total) by mouth 2 times daily at 12 noon and 4 pm.   tiZANidine (ZANAFLEX) 2 MG tablet TAKE  1 TABLET BY MOUTH TWICE DAILY AS NEEDED FOR MUSCLE SPASMS   traMADol (ULTRAM) 50 MG tablet Take by mouth every 6 (six) hours as needed.   Zoster Vaccine Adjuvanted [facility]) injection Shingles vaccine series - inject IM as directed .   [DISCONTINUED] sucralfate (CARAFATE) 1 g tablet TAKE 1 TABLET BY MOUTH FOUR TIMES A DAY IF NEEDED   [DISCONTINUED] zolpidem (AMBIEN) 10 MG tablet Take 1 tablet (10 mg total) by mouth at bedtime as needed for sleep.   No facility-administered medications prior to visit.    Review of Systems  Constitutional:  Negative for activity change, appetite change, chills, fatigue and fever.  HENT:  Negative for congestion, postnasal drip, rhinorrhea, sinus pressure, sinus pain,  sneezing and sore throat.   Eyes: Negative.   Respiratory:  Negative for cough, chest tightness, shortness of breath and wheezing.   Cardiovascular:  Negative for chest pain and palpitations.  Gastrointestinal:  Positive for constipation and diarrhea. Negative for abdominal pain, nausea and vomiting.       Improved since most recent visit. Has rarely had to take imodium. Has had to take miralax due to constipation a few time.s   Endocrine: Negative for cold intolerance, heat intolerance, polydipsia and polyuria.       Elevated blood sugars since last visit   Genitourinary:  Negative for dyspareunia, dysuria, flank pain, frequency and urgency.  Musculoskeletal:  Negative for arthralgias, back pain and myalgias.  Skin:  Negative for rash.  Allergic/Immunologic: Negative for environmental allergies.  Neurological:  Negative for dizziness, weakness and headaches.  Hematological:  Negative for adenopathy.  Psychiatric/Behavioral:  Positive for dysphoric mood and sleep disturbance. The patient is nervous/anxious.        Well managed on current medication.       Objective     Today's Vitals   04/27/21 1013  BP: 136/68  Pulse: 62  Temp: 97.6 F (36.4 C)  SpO2: 93%  Weight: 182 lb 1.9 oz (82.6 kg)  Height: 5' (1.524 m)   Body mass index is 35.57 kg/m.   Wt Readings from Last 3 Encounters:  04/27/21 182 lb 1.9 oz (82.6 kg)  03/09/21 182 lb (82.6 kg)  01/25/21 179 lb (81.2 kg)    Physical Exam Vitals and nursing note reviewed.  Constitutional:      Appearance: Normal appearance. She is well-developed.  HENT:     Head: Normocephalic and atraumatic.  Eyes:     Pupils: Pupils are equal, round, and reactive to light.  Neck:     Vascular: No carotid bruit.  Cardiovascular:     Rate and Rhythm: Normal rate and regular rhythm.     Pulses: Normal pulses.     Heart sounds: Normal heart sounds.  Pulmonary:     Effort: Pulmonary effort is normal.     Breath sounds: Normal breath  sounds.  Abdominal:     Palpations: Abdomen is soft.  Musculoskeletal:        General: Normal range of motion.     Cervical back: Normal range of motion and neck supple.  Lymphadenopathy:     Cervical: No cervical adenopathy.  Skin:    General: Skin is warm and dry.     Capillary Refill: Capillary refill takes less than 2 seconds.  Neurological:     General: No focal deficit present.     Mental Status: She is alert and oriented to person, place, and time.  Psychiatric:        Mood and Affect:  Mood normal.        Behavior: Behavior normal.        Thought Content: Thought content normal.        Judgment: Judgment normal.      Results for orders placed or performed in visit on 04/27/21  POCT glycosylated hemoglobin (Hb A1C)  Result Value Ref Range   Hemoglobin A1C 7.9 (A) 4.0 - 5.6 %   HbA1c POC (<> result, manual entry)     HbA1c, POC (prediabetic range)     HbA1c, POC (controlled diabetic range)      Assessment & Plan    1. Type 2 diabetes mellitus with hyperglycemia, without long-term current use of insulin (HCC) HgbA1c 7.9 today. Reviewed importance of limiting carbohydrates and sugar in the diet and increasing exercise. Recommended she start checking fasting glucose once daily. The goal is to have this between 70 and 100. Recheck HgbA1c in three months.  - POCT glycosylated hemoglobin (Hb A1C)  2. Hypothyroidism, unspecified type Check thyroid panel and adjust dosing of levothyroxine as indicated.  - T4, free; Future - TSH; Future - TSH - T4, free  3. Elevated liver enzymes Recheck liver enzymes  - Comp Met (CMET); Future - Comp Met (CMET)  4. Gastroesophageal reflux disease without esophagitis Continue sucralfate as previously prescribed  - sucralfate (CARAFATE) 1 g tablet; TAKE 1 TABLET BY MOUTH FOUR TIMES A DAY IF NEEDED  Dispense: 360 tablet; Refill: 1  5. Other insomnia May continue ambien as precariously prescribed. Reviewed other medicinal options with  her to help manage insomnia. Also reviewed risks associated with taking this medication. She voiced understanding. States that she has tried multiple other medications to help her with sleep and nothing has helped her like the [place] and she would like to keep taking it despite the known risks.  - zolpidem (AMBIEN) 10 MG tablet; Take 1 tablet (10 mg total) by mouth at bedtime as needed for sleep.  Dispense: 90 tablet; Refill: 1  6. Body mass index (BMI) of 35.0-35.9 in adult Discussed lowering calorie intake to 1500 calories per day and incorporating exercise into daily routine to help lose weight.    Return in about 3 months (around 07/28/2021) for diabetes with HgbA1c check.       Carlean Jews, NP  [facility] at [facility] [phone] (phone) [phone] (fax)  [facility]

## 2021-04-28 LAB — COMPREHENSIVE METABOLIC PANEL
ALT: 40 IU/L — ABNORMAL HIGH (ref 0–32)
AST: 43 IU/L — ABNORMAL HIGH (ref 0–40)
Albumin/Globulin Ratio: 2 (ref 1.2–2.2)
Albumin: 4.4 g/dL (ref 3.7–4.7)
Alkaline Phosphatase: 65 IU/L (ref 44–121)
BUN/Creatinine Ratio: 8 — ABNORMAL LOW (ref 12–28)
BUN: 6 mg/dL — ABNORMAL LOW (ref 8–27)
Bilirubin Total: 0.4 mg/dL (ref 0.0–1.2)
CO2: 23 mmol/L (ref 20–29)
Calcium: 9.8 mg/dL (ref 8.7–10.3)
Chloride: 99 mmol/L (ref 96–106)
Creatinine, Ser: 0.75 mg/dL (ref 0.57–1.00)
Globulin, Total: 2.2 g/dL (ref 1.5–4.5)
Glucose: 186 mg/dL — ABNORMAL HIGH (ref 70–99)
Potassium: 4.8 mmol/L (ref 3.5–5.2)
Sodium: 140 mmol/L (ref 134–144)
Total Protein: 6.6 g/dL (ref 6.0–8.5)
eGFR: 82 mL/min/{1.73_m2} (ref >59)

## 2021-04-28 LAB — T4, FREE: Free T4: 1.28 ng/dL (ref 0.82–1.77)

## 2021-04-28 LAB — TSH: TSH: 0.807 u[IU]/mL (ref 0.450–4.500)

## 2021-04-28 NOTE — Progress Notes (Signed)
Please let the patient know that her labs are back. Her thyroid looks normal. I will keep her on the current dose of levothyroxine. Her liver enzymes are still a little elevated, but they are stable and a little improved from last check. We will recheck this again next visit.  ?Thanks so much.   -HB

## 2021-04-30 ENCOUNTER — Other Ambulatory Visit: Payer: Self-pay | Admitting: Nurse Practitioner

## 2021-04-30 DIAGNOSIS — E1165 Type 2 diabetes mellitus with hyperglycemia: Secondary | ICD-10-CM

## 2021-05-18 ENCOUNTER — Other Ambulatory Visit: Payer: Self-pay | Admitting: Nurse Practitioner

## 2021-05-18 ENCOUNTER — Ambulatory Visit
Admission: RE | Admit: 2021-05-18 | Discharge: 2021-05-18 | Disposition: A | Discharge status: Home / Self Care | Payer: Medicare Other | Source: Ambulatory Visit | Attending: Nurse Practitioner | Admitting: Nurse Practitioner

## 2021-05-18 DIAGNOSIS — Z1231 Encounter for screening mammogram for malignant neoplasm of breast: Secondary | ICD-10-CM

## 2021-05-18 DIAGNOSIS — M85832 Other specified disorders of bone density and structure, left forearm: Secondary | ICD-10-CM | POA: Diagnosis not present

## 2021-05-18 DIAGNOSIS — M810 Age-related osteoporosis without current pathological fracture: Secondary | ICD-10-CM | POA: Diagnosis not present

## 2021-05-18 DIAGNOSIS — E1165 Type 2 diabetes mellitus with hyperglycemia: Secondary | ICD-10-CM

## 2021-05-18 DIAGNOSIS — Z780 Asymptomatic menopausal state: Secondary | ICD-10-CM | POA: Diagnosis not present

## 2021-05-18 MED ORDER — BLOOD GLUCOSE METER KIT: PACK | 0 refills | Status: DC

## 2021-05-18 MED ORDER — ACCU-CHEK GUIDE W/DEVICE KIT: PACK | 0 refills | Status: DC

## 2021-05-18 MED ORDER — ACCU-CHEK GUIDE VI STRP: ORAL_STRIP | 3 refills | Status: DC

## 2021-05-18 MED ORDER — LANCETS 30G MISC: 3 refills | Status: DC

## 2021-05-18 NOTE — Progress Notes (Signed)
Currently on fosamax '35mg'$  weekly. Consider increased to '70mg'$  weekly and repeating bone density test in two years. Discuss at visit 07/2021

## 2021-05-18 NOTE — Progress Notes (Signed)
Reordered glucometer with lancets and test strips individually and sent new prescriptions to [facility].  ?

## 2021-05-18 NOTE — Progress Notes (Signed)
Sent new prescription for Accu Check Guide glucose monitoring device with test strips and lancets to [facility] in [place].  ?

## 2021-05-19 NOTE — Progress Notes (Signed)
Negative mammogram

## 2021-05-20 ENCOUNTER — Other Ambulatory Visit: Payer: Self-pay | Admitting: Nurse Practitioner

## 2021-05-20 DIAGNOSIS — G2581 Restless legs syndrome: Secondary | ICD-10-CM

## 2021-06-04 DIAGNOSIS — E119 Type 2 diabetes mellitus without complications: Secondary | ICD-10-CM | POA: Diagnosis not present

## 2021-06-04 DIAGNOSIS — H43813 Vitreous degeneration, bilateral: Secondary | ICD-10-CM | POA: Diagnosis not present

## 2021-06-04 LAB — HM DIABETES EYE EXAM

## 2021-06-21 DIAGNOSIS — S99921A Unspecified injury of right foot, initial encounter: Secondary | ICD-10-CM | POA: Diagnosis not present

## 2021-06-21 DIAGNOSIS — S92514A Nondisplaced fracture of proximal phalanx of right lesser toe(s), initial encounter for closed fracture: Secondary | ICD-10-CM | POA: Diagnosis not present

## 2021-06-21 DIAGNOSIS — M7989 Other specified soft tissue disorders: Secondary | ICD-10-CM | POA: Diagnosis not present

## 2021-06-24 ENCOUNTER — Other Ambulatory Visit: Payer: Self-pay | Admitting: Nurse Practitioner

## 2021-06-24 ENCOUNTER — Other Ambulatory Visit: Payer: Self-pay | Admitting: Physician Assistant

## 2021-06-24 DIAGNOSIS — E782 Mixed hyperlipidemia: Secondary | ICD-10-CM

## 2021-06-24 DIAGNOSIS — I10 Essential (primary) hypertension: Secondary | ICD-10-CM

## 2021-06-24 DIAGNOSIS — F411 Generalized anxiety disorder: Secondary | ICD-10-CM

## 2021-07-01 DIAGNOSIS — M79671 Pain in right foot: Secondary | ICD-10-CM | POA: Diagnosis not present

## 2021-07-01 DIAGNOSIS — S92501D Displaced unspecified fracture of right lesser toe(s), subsequent encounter for fracture with routine healing: Secondary | ICD-10-CM | POA: Diagnosis not present

## 2021-07-01 DIAGNOSIS — E1165 Type 2 diabetes mellitus with hyperglycemia: Secondary | ICD-10-CM | POA: Diagnosis not present

## 2021-07-06 ENCOUNTER — Encounter: Payer: Self-pay | Admitting: Nurse Practitioner

## 2021-07-27 DIAGNOSIS — R300 Dysuria: Secondary | ICD-10-CM | POA: Diagnosis not present

## 2021-07-28 ENCOUNTER — Ambulatory Visit: Payer: Medicare Other | Admitting: Nurse Practitioner

## 2021-08-04 ENCOUNTER — Ambulatory Visit (INDEPENDENT_AMBULATORY_CARE_PROVIDER_SITE_OTHER): Payer: Medicare Other | Admitting: Nurse Practitioner

## 2021-08-04 ENCOUNTER — Encounter: Payer: Self-pay | Admitting: Nurse Practitioner

## 2021-08-04 VITALS — BP 123/70 | HR 63 | Temp 97.2°F | Ht 59.84 in | Wt 176.1 lb

## 2021-08-04 DIAGNOSIS — M810 Age-related osteoporosis without current pathological fracture: Secondary | ICD-10-CM

## 2021-08-04 DIAGNOSIS — F322 Major depressive disorder, single episode, severe without psychotic features: Secondary | ICD-10-CM

## 2021-08-04 DIAGNOSIS — G4709 Other insomnia: Secondary | ICD-10-CM

## 2021-08-04 DIAGNOSIS — E039 Hypothyroidism, unspecified: Secondary | ICD-10-CM

## 2021-08-04 DIAGNOSIS — E1165 Type 2 diabetes mellitus with hyperglycemia: Secondary | ICD-10-CM | POA: Diagnosis not present

## 2021-08-04 DIAGNOSIS — I10 Essential (primary) hypertension: Secondary | ICD-10-CM

## 2021-08-04 LAB — POCT GLYCOSYLATED HEMOGLOBIN (HGB A1C): Hemoglobin A1C: 7.5 % — AB (ref 4.0–5.6)

## 2021-08-04 MED ORDER — ALENDRONATE SODIUM 70 MG PO TABS: 70.0000 mg | ORAL_TABLET | ORAL | 3 refills | Status: DC

## 2021-08-04 NOTE — Progress Notes (Signed)
Established patient visit   Patient: [name]   DOB: 09/07/1945   76 y.o. Female  MRN: [id number] Visit Date: 08/04/2021   Chief Complaint  Patient presents with   Follow-up   Subjective    HPI  Follow up visit -diabetes - due to have HgbA1c done today.  --blood sugars running between mid to high 100s. A few readings in low 200s.  -recently treated for UTI per [facility] in [place]. Finished antibiotics last night.  -mammogram done 05/19/2021 - negative  -bone density test done 05/19/2021 -  --osteoporosis/osteopenia of left femur --osteopenia of left wrist.  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  --she currently takes fossamax 35 mg weekly.  -has no new concerns or complaints today.    Medications: Outpatient Medications Prior to Visit  Medication Sig Note   aspirin 81 MG tablet Take 1 tablet by mouth daily.    atorvastatin (LIPITOR) 20 MG tablet TAKE 1 TABLET BY MOUTH EVERY DAY    blood glucose meter kit and supplies Please fill with Accu Check Guide meter and strips. Glucose testing to be done daily, prior to eating and as needed for symptoms associated with hyperglycemia or hypoglycemia  (FOR ICD-10 E10.9, E11.9).    Blood Glucose Monitoring Suppl (ACCU-CHEK GUIDE) w/Device KIT Blood sugar testing to be done daily prior to eating and as needed for signs and symptoms of hyperglycemia or hypoglycemia.    busPIRone (BUSPAR) 10 MG tablet TAKE 1/2 TO 1 TABLET BY MOUTH TWICE DAILY    citalopram (CELEXA) 40 MG tablet Take 1 tablet (40 mg total) by mouth daily.    diclofenac Sodium (VOLTAREN) 1 % GEL Apply 4 g topically 4 (four) times daily.    diltiazem (CARDIZEM CD) 240 MG 24 hr capsule TAKE 1 CAPSULE(240 MG) BY MOUTH DAILY    FLUAD 0.5 ML SUSY ADM 0.5ML IM UTD    FLUAD QUADRIVALENT 0.5 ML injection     glucose blood (ACCU-CHEK GUIDE) test strip Blood sugar testing to be done daily prior to eating and as needed for signs and symptoms of  hyperglycemia or hypoglycemia.    hydrochlorothiazide (HYDRODIURIL) 12.5 MG tablet Take 1 tablet po QD prn edema    Lancets 30G MISC Blood glucose testing to be done daily ane as needed    levothyroxine (SYNTHROID) 50 MCG tablet TAKE 1 TABLET BY MOUTH EVERY DAY IN THE MORNING    lisinopril (ZESTRIL) 30 MG tablet TAKE 1 TABLET BY MOUTH EVERY DAY    loperamide (IMODIUM) 2 MG capsule Take 2 capsules (4 mg total) by mouth as needed for diarrhea or loose stools.    meloxicam (MOBIC) 15 MG tablet Take 15 mg by mouth daily.    metFORMIN (GLUCOPHAGE) 850 MG tablet TAKE 1 TABLET BY MOUTH 2 TIMES DAILY WITH A MEAL.    metoprolol tartrate (LOPRESSOR) 25 MG tablet TAKE 1 TABLET BY MOUTH TWICE A DAY    pantoprazole (PROTONIX) 40 MG tablet Take 1 tablet (40 mg total) by mouth 2 (two) times daily.    PFIZER COVID-19 VAC BIVALENT injection     promethazine (PHENERGAN) 25 MG tablet TAKE 1 TABLET BY MOUTH TWICE A DAY AS NEEDED FOR NAUSEA OR VOMITING    QUEtiapine (SEROQUEL) 50 MG tablet Take 1 tablet (50 mg total) by mouth 3 (three) times daily. May take 1 tablet in afternoons if needed for severe anxiety.    rOPINIRole (REQUIP) 0.25 MG tablet TAKE 1 TABLET (0.25 MG TOTAL) BY MOUTH  2 TIMES DAILY AT 12 NOON AND 4 PM.    sucralfate (CARAFATE) 1 g tablet TAKE 1 TABLET BY MOUTH FOUR TIMES A DAY IF NEEDED    tiZANidine (ZANAFLEX) 2 MG tablet TAKE 1 TABLET BY MOUTH TWICE DAILY AS NEEDED FOR MUSCLE SPASMS    traMADol (ULTRAM) 50 MG tablet Take by mouth every 6 (six) hours as needed.    zolpidem (AMBIEN) 10 MG tablet Take 1 tablet (10 mg total) by mouth at bedtime as needed for sleep.    Zoster Vaccine Adjuvanted (SHINGRIX) injection Shingles vaccine series - inject IM as directed .    [DISCONTINUED] alendronate (FOSAMAX) 35 MG tablet TAKE 1 TABLET BY MOUTH EVERY 7 DAYS. TAKE WITH A FULL GLASS OF WATER ON AN EMPTY STOMACH. 08/04/2021: increased dose    No facility-administered medications prior to visit.    Review of  Systems  Constitutional:  Positive for fatigue. Negative for activity change, appetite change, chills and fever.  HENT:  Negative for congestion, postnasal drip, rhinorrhea, sinus pressure, sinus pain, sneezing and sore throat.   Eyes: Negative.   Respiratory:  Negative for cough, chest tightness, shortness of breath and wheezing.   Cardiovascular:  Negative for chest pain and palpitations.  Gastrointestinal:  Negative for abdominal pain, constipation, diarrhea, nausea and vomiting.  Endocrine: Negative for cold intolerance, heat intolerance, polydipsia and polyuria.       Blood sugars have been elevated recently   Genitourinary:  Negative for dyspareunia, dysuria, flank pain, frequency and urgency.  Musculoskeletal:  Positive for arthralgias, back pain and myalgias.  Skin:  Negative for rash.  Allergic/Immunologic: Negative for environmental allergies.  Neurological:  Negative for dizziness, weakness and headaches.  Hematological:  Negative for adenopathy.  Psychiatric/Behavioral:  Positive for dysphoric mood and sleep disturbance. The patient is nervous/anxious.     Last CBC Lab Results  Component Value Date   WBC 10.3 01/25/2021   HGB 12.6 01/25/2021   HCT 39.1 01/25/2021   MCV 90 01/25/2021   MCH 29.0 01/25/2021   RDW 12.1 01/25/2021   PLT 323 01/25/2021   Last metabolic panel Lab Results  Component Value Date   GLUCOSE 186 (H) 04/27/2021   NA 140 04/27/2021   K 4.8 04/27/2021   CL 99 04/27/2021   CO2 23 04/27/2021   BUN 6 (L) 04/27/2021   CREATININE 0.75 04/27/2021   EGFR 82 04/27/2021   CALCIUM 9.8 04/27/2021   PROT 6.6 04/27/2021   ALBUMIN 4.4 04/27/2021   LABGLOB 2.2 04/27/2021   AGRATIO 2.0 04/27/2021   BILITOT 0.4 04/27/2021   ALKPHOS 65 04/27/2021   AST 43 (H) 04/27/2021   ALT 40 (H) 04/27/2021   ANIONGAP 12 04/09/2018   Last lipids Lab Results  Component Value Date   CHOL 192 01/25/2021   HDL 37 (L) 01/25/2021   LDLCALC 120 (H) 01/25/2021   TRIG  199 (H) 01/25/2021   CHOLHDL 5.2 (H) 01/25/2021   Last hemoglobin A1c Lab Results  Component Value Date   HGBA1C 7.5 (A) 08/04/2021   Last thyroid functions Lab Results  Component Value Date   TSH 0.807 04/27/2021   T4TOTAL 6.5 12/30/2015   Last vitamin D Lab Results  Component Value Date   VD25OH 48.1 12/27/2019       Objective     Today's Vitals   08/04/21 1059  BP: 123/70  Pulse: 63  Temp: (!) 97.2 F (36.2 C)  SpO2: 94%  Weight: 176 lb 1.9 oz (79.9 kg)  Height: 4'   11.84" (1.52 m)   Body mass index is 34.58 kg/m.   BP Readings from Last 3 Encounters:  08/04/21 123/70  04/27/21 136/68  03/09/21 139/73    Wt Readings from Last 3 Encounters:  08/04/21 176 lb 1.9 oz (79.9 kg)  04/27/21 182 lb 1.9 oz (82.6 kg)  03/09/21 182 lb (82.6 kg)    Physical Exam Vitals and nursing note reviewed.  Constitutional:      Appearance: Normal appearance. She is well-developed.  HENT:     Head: Normocephalic and atraumatic.     Nose: Nose normal.     Mouth/Throat:     Mouth: Mucous membranes are moist.     Pharynx: Oropharynx is clear.  Eyes:     Extraocular Movements: Extraocular movements intact.     Conjunctiva/sclera: Conjunctivae normal.     Pupils: Pupils are equal, round, and reactive to light.  Neck:     Vascular: No carotid bruit.  Cardiovascular:     Rate and Rhythm: Normal rate and regular rhythm.     Pulses: Normal pulses.     Heart sounds: Normal heart sounds.  Pulmonary:     Effort: Pulmonary effort is normal.     Breath sounds: Normal breath sounds.  Abdominal:     General: Bowel sounds are normal. There is no distension.     Palpations: Abdomen is soft. There is no mass.     Tenderness: There is no abdominal tenderness. There is no right CVA tenderness, left CVA tenderness, guarding or rebound.     Hernia: No hernia is present.  Musculoskeletal:        General: Normal range of motion.     Cervical back: Normal range of motion and neck supple.   Lymphadenopathy:     Cervical: No cervical adenopathy.  Skin:    General: Skin is warm and dry.     Capillary Refill: Capillary refill takes less than 2 seconds.  Neurological:     General: No focal deficit present.     Mental Status: She is alert and oriented to person, place, and time.  Psychiatric:        Mood and Affect: Mood normal.        Behavior: Behavior normal.        Thought Content: Thought content normal.        Judgment: Judgment normal.     Results for orders placed or performed in visit on 08/04/21  POCT glycosylated hemoglobin (Hb A1C)  Result Value Ref Range   Hemoglobin A1C 7.5 (A) 4.0 - 5.6 %   HbA1c POC (<> result, manual entry)     HbA1c, POC (prediabetic range)     HbA1c, POC (controlled diabetic range)      Assessment & Plan    1. Type 2 diabetes mellitus with hyperglycemia, without long-term current use of insulin (HCC) HgbA1c 7.5 today. Continue diabetic medication as prescribed. She should try to limit intake of carbohydrates and sugar and increase water intake and increase exercise as tolerated. Recheck HgbA1c in 3 months  - POCT glycosylated hemoglobin (Hb A1C)  2. Essential hypertension, benign Stable. Continue bp medication as prescribed   3. Hypothyroidism, unspecified type Thyroid panel stable. Continue levothyroxine as currently prescribed   4. Age related osteoporosis, unspecified pathological fracture presence Increase fosamax to 70 mg weekly. Repeat bone density test in two years.  - alendronate (FOSAMAX) 70 MG tablet; Take 1 tablet (70 mg total) by mouth every 7 (seven) days. Take with a full   glass of water on an empty stomach.  Dispense: 12 tablet; Refill: 3  5. Depression, major, single episode, severe (HCC) Stable. Continue citalopram and buspirone as prescribed   6. Other insomnia May continue to take ambien as needed and as previously prescribed    Problem List Items Addressed This Visit       Cardiovascular and Mediastinum    Essential hypertension, benign     Endocrine   Type 2 diabetes mellitus with hyperglycemia, without long-term current use of insulin (HCC) - Primary   Relevant Orders   POCT glycosylated hemoglobin (Hb A1C) (Completed)   Hypothyroidism     Musculoskeletal and Integument   Age related osteoporosis   Relevant Medications   alendronate (FOSAMAX) 70 MG tablet     Other   Other insomnia   Depression, major, single episode, severe (HCC)     Return in about 3 months (around 11/04/2021) for diabetes with HgbA1c check, check CMP at time of visit .          E , NP  [facility] [phone] (phone) [phone] (fax)   Medical Group  

## 2021-08-12 ENCOUNTER — Other Ambulatory Visit: Payer: Self-pay | Admitting: Nurse Practitioner

## 2021-08-12 DIAGNOSIS — F322 Major depressive disorder, single episode, severe without psychotic features: Secondary | ICD-10-CM

## 2021-08-19 DIAGNOSIS — G8929 Other chronic pain: Secondary | ICD-10-CM | POA: Diagnosis not present

## 2021-08-19 DIAGNOSIS — M545 Low back pain, unspecified: Secondary | ICD-10-CM | POA: Diagnosis not present

## 2021-09-04 ENCOUNTER — Other Ambulatory Visit: Payer: Self-pay | Admitting: Nurse Practitioner

## 2021-09-04 DIAGNOSIS — I10 Essential (primary) hypertension: Secondary | ICD-10-CM

## 2021-09-10 ENCOUNTER — Other Ambulatory Visit: Payer: Self-pay | Admitting: *Deleted

## 2021-09-10 NOTE — Patient Outreach (Signed)
  Care Coordination   Initial Visit Note   09/10/2021 Name: [name] MRN: [id number] DOB: [birth year]  [name] is a 76 y.o. year old female who sees Boscia, Greer Ee, NP for primary care. I spoke with  [name] daughter by phone today  What matters to the patients health and wellness today?  Getting a shower chair to go into the bath tub to make bathing easier   SDOH assessments and interventions completed:  Yes     Care Coordination Interventions Activated:  Yes  Care Coordination Interventions:  Yes, provided   Follow up plan: Referral made to [name] 09/28/2021 3 pm    Encounter Outcome:  Pt. Scheduled   [name] BSN RN [facility] [phone]

## 2021-09-14 DIAGNOSIS — M5416 Radiculopathy, lumbar region: Secondary | ICD-10-CM | POA: Diagnosis not present

## 2021-09-14 DIAGNOSIS — M5126 Other intervertebral disc displacement, lumbar region: Secondary | ICD-10-CM | POA: Diagnosis not present

## 2021-10-07 ENCOUNTER — Other Ambulatory Visit: Payer: Self-pay | Admitting: Nurse Practitioner

## 2021-10-07 DIAGNOSIS — I10 Essential (primary) hypertension: Secondary | ICD-10-CM

## 2021-10-07 DIAGNOSIS — M5440 Lumbago with sciatica, unspecified side: Secondary | ICD-10-CM

## 2021-10-07 DIAGNOSIS — F322 Major depressive disorder, single episode, severe without psychotic features: Secondary | ICD-10-CM

## 2021-10-12 DIAGNOSIS — M48062 Spinal stenosis, lumbar region with neurogenic claudication: Secondary | ICD-10-CM | POA: Diagnosis not present

## 2021-10-12 DIAGNOSIS — M5416 Radiculopathy, lumbar region: Secondary | ICD-10-CM | POA: Diagnosis not present

## 2021-10-12 DIAGNOSIS — M5126 Other intervertebral disc displacement, lumbar region: Secondary | ICD-10-CM | POA: Diagnosis not present

## 2021-10-18 ENCOUNTER — Emergency Department: Payer: Medicare Other

## 2021-10-18 ENCOUNTER — Other Ambulatory Visit: Payer: Self-pay

## 2021-10-18 ENCOUNTER — Emergency Department
Admission: EM | Admit: 2021-10-18 | Discharge: 2021-10-18 | Disposition: A | Discharge status: Home / Self Care | Payer: Medicare Other | Attending: Emergency Medicine | Admitting: Emergency Medicine

## 2021-10-18 DIAGNOSIS — K529 Noninfective gastroenteritis and colitis, unspecified: Secondary | ICD-10-CM

## 2021-10-18 DIAGNOSIS — K5730 Diverticulosis of large intestine without perforation or abscess without bleeding: Secondary | ICD-10-CM | POA: Diagnosis not present

## 2021-10-18 DIAGNOSIS — R1084 Generalized abdominal pain: Secondary | ICD-10-CM | POA: Diagnosis not present

## 2021-10-18 DIAGNOSIS — E119 Type 2 diabetes mellitus without complications: Secondary | ICD-10-CM | POA: Diagnosis not present

## 2021-10-18 DIAGNOSIS — I10 Essential (primary) hypertension: Secondary | ICD-10-CM | POA: Diagnosis not present

## 2021-10-18 DIAGNOSIS — R197 Diarrhea, unspecified: Secondary | ICD-10-CM | POA: Insufficient documentation

## 2021-10-18 DIAGNOSIS — K6389 Other specified diseases of intestine: Secondary | ICD-10-CM | POA: Diagnosis not present

## 2021-10-18 DIAGNOSIS — N281 Cyst of kidney, acquired: Secondary | ICD-10-CM | POA: Diagnosis not present

## 2021-10-18 LAB — CBC WITH DIFFERENTIAL/PLATELET
Abs Immature Granulocytes: 0.09 10*3/uL — ABNORMAL HIGH (ref 0.00–0.07)
Basophils Absolute: 0.1 10*3/uL (ref 0.0–0.1)
Basophils Relative: 1 %
Eosinophils Absolute: 0.1 10*3/uL (ref 0.0–0.5)
Eosinophils Relative: 0 %
HCT: 31.1 % — ABNORMAL LOW (ref 36.0–46.0)
Hemoglobin: 9.9 g/dL — ABNORMAL LOW (ref 12.0–15.0)
Immature Granulocytes: 0 %
Lymphocytes Relative: 14 %
Lymphs Abs: 2.9 10*3/uL (ref 0.7–4.0)
MCH: 27 pg (ref 26.0–34.0)
MCHC: 31.8 g/dL (ref 30.0–36.0)
MCV: 84.7 fL (ref 80.0–100.0)
Monocytes Absolute: 1.3 10*3/uL — ABNORMAL HIGH (ref 0.1–1.0)
Monocytes Relative: 6 %
Neutro Abs: 17.1 10*3/uL — ABNORMAL HIGH (ref 1.7–7.7)
Neutrophils Relative %: 79 %
Platelets: 312 10*3/uL (ref 150–400)
RBC: 3.67 MIL/uL — ABNORMAL LOW (ref 3.87–5.11)
RDW: 13.3 % (ref 11.5–15.5)
WBC: 21.5 10*3/uL — ABNORMAL HIGH (ref 4.0–10.5)
nRBC: 0 % (ref 0.0–0.2)

## 2021-10-18 LAB — COMPREHENSIVE METABOLIC PANEL
ALT: 35 U/L (ref 0–44)
AST: 44 U/L — ABNORMAL HIGH (ref 15–41)
Albumin: 4.2 g/dL (ref 3.5–5.0)
Alkaline Phosphatase: 52 U/L (ref 38–126)
Anion gap: 12 (ref 5–15)
BUN: 14 mg/dL (ref 8–23)
CO2: 21 mmol/L — ABNORMAL LOW (ref 22–32)
Calcium: 10.1 mg/dL (ref 8.9–10.3)
Chloride: 98 mmol/L (ref 98–111)
Creatinine, Ser: 0.92 mg/dL (ref 0.44–1.00)
GFR, Estimated: >60 mL/min (ref >60)
Glucose, Bld: 217 mg/dL — ABNORMAL HIGH (ref 70–99)
Potassium: 4.7 mmol/L (ref 3.5–5.1)
Sodium: 131 mmol/L — ABNORMAL LOW (ref 135–145)
Total Bilirubin: 0.7 mg/dL (ref 0.3–1.2)
Total Protein: 6.6 g/dL (ref 6.5–8.1)

## 2021-10-18 MED ORDER — IOHEXOL 300 MG/ML  SOLN
100.0000 mL | Freq: Once | INTRAMUSCULAR | Status: AC | PRN
  Administered 2021-10-18: 100 mL via INTRAVENOUS

## 2021-10-18 MED ORDER — SODIUM CHLORIDE 0.9 % IV BOLUS
500.0000 mL | Freq: Once | INTRAVENOUS | Status: AC
  Administered 2021-10-18: 500 mL via INTRAVENOUS

## 2021-10-18 NOTE — ED Triage Notes (Signed)
Pt arrives with c/o diarrhea that started this morning. Pt denies ABD pain or n/v. Per pt, she has a hx of colitis.

## 2021-10-18 NOTE — Discharge Instructions (Addendum)
Continue to hydrate by drinking plenty of water and electrolyte replacement formulas that we discussed, you can find at your local pharmacy.  Call your doctor for an appointment this week.  If you develop fever, severe pain, blood in the stools or any other worsening call your doctor right away or come back to the emergency department.   Thank you for choosing [place] for your health care today!  Please see your primary doctor this week for a follow up appointment.   If you do not have a primary doctor call the following clinics to establish care:  If you have insurance:  [facility] [phone] [place] [address]   [facility]  [phone] [address] [place] [address]   If you do not have insurance:  [facility]  [phone] [address] [place] [address]  Sometimes, in the early stages of certain disease courses it is difficult to detect in the emergency department evaluation -- so, it is important that you continue to monitor your symptoms and call your doctor right away or return to the emergency department if you develop any new or worsening symptoms.  It was my pleasure to care for you today.   Hoover Brunette Jacelyn Grip, MD

## 2021-10-18 NOTE — ED Provider Notes (Signed)
[facility] Provider Note    Event Date/Time   First MD Initiated Contact with Patient 10/18/21 [phone]     (approximate)   History   Diarrhea   HPI  [name] is a 76 y.o. female   Past medical history of diabetes, GERD, hypertension, depression and anxiety, disease on Synthroid presents with diarrhea starting last night around 3 in the morning.  She has had multiple loose bowel movements without blood.  She has some very mild abdominal discomfort.  No urinary symptoms.  No nausea or vomiting.  No fever.  No recent travels.  No recent hospitalizations or antibiotic use.  History was obtained via patient and daughter at bedside      Physical Exam   Triage Vital Signs: ED Triage Vitals  Enc Vitals Group     BP 10/18/21 0907 (!) 146/55     Pulse Rate 10/18/21 0907 71     Resp 10/18/21 0907 13     Temp 10/18/21 0907 97.9 F (36.6 C)     Temp Source 10/18/21 0907 Oral     SpO2 10/18/21 0907 97 %     Weight 10/18/21 0908 162 lb (73.5 kg)     Height --      Head Circumference --      Peak Flow --      Pain Score 10/18/21 0908 0     Pain Loc --      Pain Edu? --      Excl. in [place]? --     Most recent vital signs: Vitals:   10/18/21 0907 10/18/21 0930  BP: (!) 146/55 (!) 130/54  Pulse: 71 65  Resp: 13 13  Temp: 97.9 F (36.6 C)   SpO2: 97% 96%    General: Awake, no distress.  CV:  Good peripheral perfusion.  Resp:  Normal effort.  Abd:  No distention.  No rigidity or guarding.  Mild tenderness reported diffusely on palpation. Other:  Nontoxic-appearing, pleasant.   ED Results / Procedures / Treatments   Labs (all labs ordered are listed, but only abnormal results are displayed) Labs Reviewed  COMPREHENSIVE METABOLIC PANEL - Abnormal; Notable for the following components:      Result Value   Sodium 131 (*)    CO2 21 (*)    Glucose, Bld 217 (*)    AST 44 (*)    All other components within normal limits  CBC WITH  DIFFERENTIAL/PLATELET - Abnormal; Notable for the following components:   WBC 21.5 (*)    RBC 3.67 (*)    Hemoglobin 9.9 (*)    HCT 31.1 (*)    Neutro Abs 17.1 (*)    Monocytes Absolute 1.3 (*)    Abs Immature Granulocytes 0.09 (*)    All other components within normal limits     I reviewed labs and they are notable for leukocytosis of 21.5.  Normal creatinine    RADIOLOGY I independently reviewed and interpreted CT abd/pelvis and see no obvious fluid collections or significant inflammatory changes.    PROCEDURES:  Critical Care performed: No  Procedures   MEDICATIONS ORDERED IN ED: Medications  sodium chloride 0.9 % bolus 500 mL (500 mLs Intravenous New Bag/Given 10/18/21 0934)  sodium chloride 0.9 % bolus 500 mL (500 mLs Intravenous New Bag/Given 10/18/21 0949)  iohexol (OMNIPAQUE) 300 MG/ML solution 100 mL (100 mLs Intravenous Contrast Given 10/18/21 1002)    IMPRESSION / MDM / ASSESSMENT AND PLAN / ED COURSE  I  reviewed the triage vital signs and the nursing notes.                              Differential diagnosis includes, but is not limited to, diverticulitis, colitis, gastroenteritis, check for dehydration or metabolic derangements. Considered but less likely mesenteric ischemia, urinary tract infection,   The patient is on the cardiac monitor to evaluate for evidence of arrhythmia and/or significant heart rate changes.  MDM: Patient with symptoms of colitis with some mild abdominal tenderness to palpation and a leukocytosis, will check a CT scan of the abdomen pelvis with IV contrast to assess for diverticulitis, other intra-abdominal infectious etiologies, colitis. She is otherwise well-appearing and if isolated diarrhea without complications, will defer antibiotics given no high risk features like fever, blood, recent travel, hospitalizations or antibiotics.  Advised to hydrate with electrolyte formulations, and close monitoring at home for worsening in which  case she will return to the emergency department, and PMD follow-up this week  Dispo: After careful consideration of this patient's presentation, medical and social risk factors, and evaluation in the emergency department I engaged in shared decision making with the patient and/or their representative to consider admission or observation and this patient was ultimately discharged because stable condition, no significant dehydration, with adequate follow up plan and safe dispo home with family.   Patient's presentation is most consistent with acute complicated illness / injury requiring diagnostic workup.       FINAL CLINICAL IMPRESSION(S) / ED DIAGNOSES   Final diagnoses:  Diarrhea, unspecified type     Rx / DC Orders   ED Discharge Orders     None        Note:  This document was prepared using Dragon voice recognition software and may include unintentional dictation errors.    Lucillie Garfinkel, MD 10/18/21 1028

## 2021-10-28 ENCOUNTER — Other Ambulatory Visit: Payer: Self-pay | Admitting: Nurse Practitioner

## 2021-10-28 DIAGNOSIS — E1165 Type 2 diabetes mellitus with hyperglycemia: Secondary | ICD-10-CM

## 2021-10-28 DIAGNOSIS — K219 Gastro-esophageal reflux disease without esophagitis: Secondary | ICD-10-CM

## 2021-11-03 NOTE — Progress Notes (Signed)
Established patient visit   Patient: [name]   DOB: 1945-09-21   76 y.o. Female  MRN: [id number] Visit Date: 11/04/2021   Chief Complaint  Patient presents with   Follow-up   Diabetes   Subjective    HPI  Follow up diabetes  -due to have check HgbA1c today  -needs urine microalbumin as well  -was seen in ER on 10/18/2021 due to diarrhea --diagnosed with colitis  --this has been recurrent problem. Happens intermittently. Would like to hold off on GI provider for now. .  --large hiatal hernia also noted  -atherosclerosis including coronary arteries seen on CT  --currently on lipitor 20 mg daily  -due 01/2022 for medicare wellness visit   Having some intermittent lightheadedness when standing up and changing position.  -blood pressure low side of normal.   Medications: Outpatient Medications Prior to Visit  Medication Sig   alendronate (FOSAMAX) 70 MG tablet Take 1 tablet (70 mg total) by mouth every 7 (seven) days. Take with a full glass of water on an empty stomach.   aspirin 81 MG tablet Take 1 tablet by mouth daily.   atorvastatin (LIPITOR) 20 MG tablet TAKE 1 TABLET BY MOUTH EVERY DAY   blood glucose meter kit and supplies Please fill with Accu Check Guide meter and strips. Glucose testing to be done daily, prior to eating and as needed for symptoms associated with hyperglycemia or hypoglycemia  (FOR ICD-10 E10.9, E11.9).   Blood Glucose Monitoring Suppl (ACCU-CHEK GUIDE) w/Device KIT Blood sugar testing to be done daily prior to eating and as needed for signs and symptoms of hyperglycemia or hypoglycemia.   busPIRone (BUSPAR) 10 MG tablet TAKE 1/2 TO 1 TABLET BY MOUTH TWICE DAILY   citalopram (CELEXA) 40 MG tablet TAKE 1 TABLET BY MOUTH EVERY DAY   diclofenac Sodium (VOLTAREN) 1 % GEL Apply 4 g topically 4 (four) times daily.   diltiazem (CARDIZEM CD) 240 MG 24 hr capsule TAKE 1 CAPSULE(240 MG) BY MOUTH DAILY   FLUAD 0.5 ML SUSY ADM 0.5ML IM UTD   FLUAD  QUADRIVALENT 0.5 ML injection    glucose blood (ACCU-CHEK GUIDE) test strip Blood sugar testing to be done daily prior to eating and as needed for signs and symptoms of hyperglycemia or hypoglycemia.   hydrochlorothiazide (HYDRODIURIL) 12.5 MG tablet Take 1 tablet po QD prn edema   Lancets 30G MISC Blood glucose testing to be done daily ane as needed   levothyroxine (SYNTHROID) 50 MCG tablet TAKE 1 TABLET BY MOUTH EVERY DAY IN THE MORNING   loperamide (IMODIUM) 2 MG capsule Take 2 capsules (4 mg total) by mouth as needed for diarrhea or loose stools.   meloxicam (MOBIC) 15 MG tablet Take 15 mg by mouth daily.   metFORMIN (GLUCOPHAGE) 850 MG tablet TAKE 1 TABLET BY MOUTH TWICE A DAY WITH MEALS   metoprolol tartrate (LOPRESSOR) 25 MG tablet TAKE 1 TABLET BY MOUTH TWICE A DAY   pantoprazole (PROTONIX) 40 MG tablet TAKE 1 TABLET BY MOUTH TWICE A DAY   PFIZER COVID-19 VAC BIVALENT injection    promethazine (PHENERGAN) 25 MG tablet TAKE 1 TABLET BY MOUTH TWICE A DAY AS NEEDED FOR NAUSEA OR VOMITING   QUEtiapine (SEROQUEL) 50 MG tablet TAKE 1 TABLET BY MOUTH 3 TIMES DAILY. MAY TAKE 1 TABLET IN AFTERNOON IF NEEDED FOR SEVERE ANXIETY.   rOPINIRole (REQUIP) 0.25 MG tablet TAKE 1 TABLET (0.25 MG TOTAL) BY MOUTH 2 TIMES DAILY AT 12 NOON AND 4 PM.  sucralfate (CARAFATE) 1 g tablet TAKE 1 TABLET BY MOUTH FOUR TIMES A DAY IF NEEDED   tiZANidine (ZANAFLEX) 2 MG tablet TAKE 1 TABLET BY MOUTH TWICE DAILY AS NEEDED FOR MUSCLE SPASMS   traMADol (ULTRAM) 50 MG tablet Take by mouth every 6 (six) hours as needed.   Zoster Vaccine Adjuvanted [facility]) injection Shingles vaccine series - inject IM as directed .   [DISCONTINUED] lisinopril (ZESTRIL) 30 MG tablet TAKE 1 TABLET BY MOUTH EVERY DAY   [DISCONTINUED] zolpidem (AMBIEN) 10 MG tablet Take 1 tablet (10 mg total) by mouth at bedtime as needed for sleep.   No facility-administered medications prior to visit.    Review of Systems  Constitutional:  Positive for  activity change and fatigue. Negative for appetite change, chills and fever.  HENT:  Negative for congestion, postnasal drip, rhinorrhea, sinus pressure, sinus pain, sneezing and sore throat.   Eyes: Negative.   Respiratory:  Negative for cough, chest tightness, shortness of breath and wheezing.   Cardiovascular:  Negative for chest pain and palpitations.  Gastrointestinal:  Positive for diarrhea. Negative for abdominal pain, constipation, nausea and vomiting.  Endocrine: Negative for cold intolerance, heat intolerance, polydipsia and polyuria.       Blood sugars elevated past few months   Genitourinary:  Negative for dyspareunia, dysuria, flank pain, frequency and urgency.  Musculoskeletal:  Negative for arthralgias, back pain and myalgias.  Skin:  Negative for rash.  Allergic/Immunologic: Negative for environmental allergies.  Neurological:  Positive for dizziness. Negative for weakness and headaches.       Most severe when sitting up from laying down and from standing up from sitting down   Hematological:  Negative for adenopathy.  Psychiatric/Behavioral:  Positive for dysphoric mood and sleep disturbance. The patient is nervous/anxious.      Objective     Today's Vitals   11/04/21 1020  BP: 118/73  Pulse: 64  SpO2: 96%  Weight: 178 lb 12.8 oz (81.1 kg)   Body mass index is 35.1 kg/m.   BP Readings from Last 3 Encounters:  11/04/21 118/73  10/18/21 (Abnormal) 133/59  08/04/21 123/70    Wt Readings from Last 3 Encounters:  11/04/21 178 lb 12.8 oz (81.1 kg)  10/18/21 162 lb (73.5 kg)  08/04/21 176 lb 1.9 oz (79.9 kg)    Physical Exam Vitals and nursing note reviewed.  Constitutional:      Appearance: Normal appearance. She is well-developed.  HENT:     Head: Normocephalic and atraumatic.     Nose: Nose normal.     Mouth/Throat:     Mouth: Mucous membranes are moist.     Pharynx: Oropharynx is clear.  Eyes:     Extraocular Movements: Extraocular movements intact.      Conjunctiva/sclera: Conjunctivae normal.     Pupils: Pupils are equal, round, and reactive to light.  Cardiovascular:     Rate and Rhythm: Normal rate and regular rhythm.     Pulses: Normal pulses.     Heart sounds: Normal heart sounds.  Pulmonary:     Effort: Pulmonary effort is normal.     Breath sounds: Normal breath sounds.  Abdominal:     General: Bowel sounds are normal. There is no distension.     Palpations: Abdomen is soft. There is no mass.     Tenderness: There is no abdominal tenderness. There is no right CVA tenderness, left CVA tenderness, guarding or rebound.     Hernia: No hernia is present.  Comments: Recently treated for colitis and diarrhea is improved   Musculoskeletal:        General: Normal range of motion.     Cervical back: Normal range of motion and neck supple.  Lymphadenopathy:     Cervical: No cervical adenopathy.  Skin:    General: Skin is warm and dry.     Capillary Refill: Capillary refill takes less than 2 seconds.  Neurological:     General: No focal deficit present.     Mental Status: She is alert and oriented to person, place, and time. Mental status is at baseline.  Psychiatric:        Mood and Affect: Mood normal.        Behavior: Behavior normal.        Thought Content: Thought content normal.        Judgment: Judgment normal.     Results for orders placed or performed in visit on 11/04/21  POCT glycosylated hemoglobin (Hb A1C)  Result Value Ref Range   Hemoglobin A1C 7.7 (A) 4.0 - 5.6 %   HbA1c POC (<> result, manual entry)     HbA1c, POC (prediabetic range)     HbA1c, POC (controlled diabetic range)      Assessment & Plan    1. Type 2 diabetes mellitus with hyperglycemia, without long-term current use of insulin (HCC) Hemoglobin A1c elevated at 7.7 today.  Encouraged regular limit carbohydrates and sugar in her diet.  She should increase water intake.  Recheck in 3 months. - POCT glycosylated hemoglobin (Hb A1C)  2.  Dizziness Blood pressure is running slightly low.  Dizziness may be caused by orthostasis from lower blood pressure.  Decrease lisinopril to 20 mg daily.  Continue other medications as prescribed.  Ensure proper water intake every day.  3. Essential hypertension, benign Dizziness may be caused by orthostasis from lower blood pressure.  Decrease lisinopril to 20 mg daily.  Continue other medications as prescribed.   - lisinopril (ZESTRIL) 20 MG tablet; Take 1 tablet (20 mg total) by mouth daily.  Dispense: 90 tablet; Refill: 1  4. Other insomnia Patient may continue Ambien milligrams at bedtime as needed for sleep. - zolpidem (AMBIEN) 10 MG tablet; Take 1 tablet (10 mg total) by mouth at bedtime as needed for sleep.  Dispense: 90 tablet; Refill: 1    Problem List Items Addressed This Visit       Cardiovascular and Mediastinum   Essential hypertension, benign   Relevant Medications   lisinopril (ZESTRIL) 20 MG tablet     Endocrine   Type 2 diabetes mellitus with hyperglycemia, without long-term current use of insulin (HCC) - Primary   Relevant Medications   lisinopril (ZESTRIL) 20 MG tablet   Other Relevant Orders   POCT glycosylated hemoglobin (Hb A1C) (Completed)     Other   Other insomnia   Relevant Medications   zolpidem (AMBIEN) 10 MG tablet   Dizziness     Return in about 3 months (around 02/03/2022) for medicare wellness.         Ronnell Freshwater, NP  [facility] at [facility] [phone] (phone) [phone] (fax)  [place]

## 2021-11-04 ENCOUNTER — Encounter: Payer: Self-pay | Admitting: Nurse Practitioner

## 2021-11-04 ENCOUNTER — Ambulatory Visit (INDEPENDENT_AMBULATORY_CARE_PROVIDER_SITE_OTHER): Payer: Medicare Other | Admitting: Nurse Practitioner

## 2021-11-04 VITALS — BP 118/73 | HR 64 | Ht 59.0 in | Wt 178.8 lb

## 2021-11-04 DIAGNOSIS — I10 Essential (primary) hypertension: Secondary | ICD-10-CM

## 2021-11-04 DIAGNOSIS — R42 Dizziness and giddiness: Secondary | ICD-10-CM

## 2021-11-04 DIAGNOSIS — E1165 Type 2 diabetes mellitus with hyperglycemia: Secondary | ICD-10-CM | POA: Diagnosis not present

## 2021-11-04 DIAGNOSIS — G4709 Other insomnia: Secondary | ICD-10-CM | POA: Diagnosis not present

## 2021-11-04 LAB — POCT GLYCOSYLATED HEMOGLOBIN (HGB A1C): Hemoglobin A1C: 7.7 % — AB (ref 4.0–5.6)

## 2021-11-04 MED ORDER — ZOLPIDEM TARTRATE 10 MG PO TABS: 10.0000 mg | ORAL_TABLET | Freq: Every evening | ORAL | 1 refills | Status: DC | PRN

## 2021-11-04 MED ORDER — LISINOPRIL 20 MG PO TABS: 20.0000 mg | ORAL_TABLET | Freq: Every day | ORAL | 1 refills | Status: DC

## 2021-11-14 DIAGNOSIS — R42 Dizziness and giddiness: Secondary | ICD-10-CM | POA: Insufficient documentation

## 2021-11-29 ENCOUNTER — Other Ambulatory Visit: Payer: Self-pay | Admitting: Nurse Practitioner

## 2021-11-29 DIAGNOSIS — K219 Gastro-esophageal reflux disease without esophagitis: Secondary | ICD-10-CM

## 2021-11-29 DIAGNOSIS — G2581 Restless legs syndrome: Secondary | ICD-10-CM

## 2021-12-25 ENCOUNTER — Other Ambulatory Visit: Payer: Self-pay | Admitting: Physician Assistant

## 2021-12-25 ENCOUNTER — Other Ambulatory Visit: Payer: Self-pay | Admitting: Nurse Practitioner

## 2021-12-25 DIAGNOSIS — F411 Generalized anxiety disorder: Secondary | ICD-10-CM

## 2021-12-25 DIAGNOSIS — E782 Mixed hyperlipidemia: Secondary | ICD-10-CM

## 2021-12-25 DIAGNOSIS — I10 Essential (primary) hypertension: Secondary | ICD-10-CM

## 2022-01-07 ENCOUNTER — Other Ambulatory Visit: Payer: Self-pay | Admitting: Nurse Practitioner

## 2022-01-07 DIAGNOSIS — R110 Nausea: Secondary | ICD-10-CM

## 2022-01-10 NOTE — Telephone Encounter (Signed)
Office visit required for refills.

## 2022-01-28 ENCOUNTER — Other Ambulatory Visit: Payer: Self-pay | Admitting: Nurse Practitioner

## 2022-01-28 DIAGNOSIS — E039 Hypothyroidism, unspecified: Secondary | ICD-10-CM

## 2022-01-28 DIAGNOSIS — F411 Generalized anxiety disorder: Secondary | ICD-10-CM

## 2022-01-29 ENCOUNTER — Other Ambulatory Visit: Payer: Self-pay | Admitting: Nurse Practitioner

## 2022-01-29 DIAGNOSIS — I10 Essential (primary) hypertension: Secondary | ICD-10-CM

## 2022-01-29 DIAGNOSIS — F322 Major depressive disorder, single episode, severe without psychotic features: Secondary | ICD-10-CM

## 2022-02-03 ENCOUNTER — Ambulatory Visit: Payer: TRICARE For Life (TFL) | Admitting: Nurse Practitioner

## 2022-02-23 ENCOUNTER — Telehealth: Payer: Self-pay

## 2022-02-23 NOTE — Patient Outreach (Signed)
  Care Coordination   02/23/2022 Name: [name] MRN: [id number] DOB: 1945/05/15   Care Coordination Outreach Attempts:  An unsuccessful telephone outreach was attempted today to offer the patient information about available care coordination services as a benefit of their health plan.   Follow Up Plan:  Additional outreach attempts will be made to offer the patient care coordination information and services.   Encounter Outcome:  No Answer   Care Coordination Interventions:  No, not indicated    Jone Baseman, RN, MSN [place] Management Care Management Coordinator Direct Line [phone]

## 2022-03-09 ENCOUNTER — Telehealth: Payer: Self-pay

## 2022-03-09 NOTE — Patient Outreach (Signed)
  Care Coordination   Initial Visit Note   03/09/2022 Name: ALVERNA FAWLEY MRN: [id number] DOB: December 27, 1945  Laurence Slate Reichenberger is a 77 y.o. year old female who sees Boscia, Greer Ee, NP for primary care. I spoke with  Laurence Slate Cain by phone today.  What matters to the patients health and wellness today?  none    Goals Addressed             This Visit's Progress    Care Coordination Activities-No follow up required       Care Coordination Interventions: Advised patient to Annual Wellness exam. Scheduled 03-10-22 Discussed [facility] services and support. Assessed SDOH. Advised to discuss with primary care physician if services needed in the future.         SDOH assessments and interventions completed:  Yes  SDOH Interventions Today    Flowsheet Row Most Recent Value  SDOH Interventions   Food Insecurity Interventions Intervention Not Indicated  Housing Interventions Intervention Not Indicated        Care Coordination Interventions:  Yes, provided   Follow up plan: No further intervention required.   Encounter Outcome:  Pt. Visit Completed   Jone Baseman, RN, MSN [place] Management Care Management Coordinator Direct Line [phone]

## 2022-03-09 NOTE — Patient Instructions (Signed)
Visit Information  Thank you for taking time to visit with me today. Please don't hesitate to contact me if I can be of assistance to you.   Following are the goals we discussed today:   Goals Addressed             This Visit's Progress    Care Coordination Activities-No follow up required       Care Coordination Interventions: Advised patient to Annual Wellness exam. Scheduled 03-10-22 Discussed [facility] services and support. Assessed SDOH. Advised to discuss with primary care physician if services needed in the future.          If you are experiencing a Mental Health or [place] or need someone to talk to, please call the Suicide and Crisis Lifeline: 988   The patient verbalized understanding of instructions, educational materials, and care plan provided today and DECLINED offer to receive copy of patient instructions, educational materials, and care plan.   No further follow up required: decline  Jone Baseman, RN, MSN [place] Management Care Management Coordinator Direct Line [phone]

## 2022-03-10 ENCOUNTER — Encounter: Payer: Self-pay | Admitting: Nurse Practitioner

## 2022-03-10 ENCOUNTER — Ambulatory Visit (INDEPENDENT_AMBULATORY_CARE_PROVIDER_SITE_OTHER): Payer: Medicare Other | Admitting: Nurse Practitioner

## 2022-03-10 VITALS — BP 127/82 | HR 66 | Resp 16 | Ht 59.0 in | Wt 183.0 lb

## 2022-03-10 DIAGNOSIS — E1165 Type 2 diabetes mellitus with hyperglycemia: Secondary | ICD-10-CM | POA: Diagnosis not present

## 2022-03-10 DIAGNOSIS — E782 Mixed hyperlipidemia: Secondary | ICD-10-CM

## 2022-03-10 DIAGNOSIS — E039 Hypothyroidism, unspecified: Secondary | ICD-10-CM

## 2022-03-10 DIAGNOSIS — I10 Essential (primary) hypertension: Secondary | ICD-10-CM

## 2022-03-10 DIAGNOSIS — Z0000 Encounter for general adult medical examination without abnormal findings: Secondary | ICD-10-CM

## 2022-03-10 DIAGNOSIS — E559 Vitamin D deficiency, unspecified: Secondary | ICD-10-CM

## 2022-03-10 DIAGNOSIS — M5440 Lumbago with sciatica, unspecified side: Secondary | ICD-10-CM

## 2022-03-10 MED ORDER — TRAMADOL HCL 50 MG PO TABS: 50.0000 mg | ORAL_TABLET | Freq: Three times a day (TID) | ORAL | 0 refills | Status: AC | PRN

## 2022-03-10 NOTE — Progress Notes (Signed)
Subjective:   Shanequa Lackman Offenberger is a 77 y.o. female who presents for Medicare Annual (Subsequent) preventive examination. -due for HgbA1c  -due for urine microalbumin  --generally well controlled  -due for routine, fasting labs in March  -She denies chest pain, chest pressure, or shortness of breath. She denies headaches or visual disturbances. She denies abdominal pain, nausea, vomiting, or changes in bowel or bladder habits.    Review of Systems    See HPI         Objective:    Today's Vitals   03/10/22 1307  BP: 127/82  Pulse: 66  Resp: 16  SpO2: 96%  Weight: 183 lb (83 kg)  Height: '4\' 11"'$  (1.499 m)  PainSc: 6   PainLoc: Back   Body mass index is 36.96 kg/m.    Row Labels 10/28/2020    6:31 AM 04/12/2019    8:45 AM 03/04/2017    7:35 PM 09/13/2016   10:40 AM 09/12/2016   11:42 PM 09/12/2016    5:44 PM 01/13/2016   10:33 AM  Advanced Directives   Section Header. No data exists in this row.         Does Patient Have a Medical Advance Directive?   Yes Yes No No No No   Type of Scientist, physiological of [place];Living will [place]       Does patient want to make changes to medical advance directive?   No - Guardian declined        Copy of [place] in Chart?   Yes - validated most recent copy scanned in chart (See row information)        Would patient like information on creating a medical advance directive?      Yes (Inpatient - patient requests chaplain consult to create a medical advance directive) Yes (Inpatient - patient requests chaplain consult to create a medical advance directive) Yes (Inpatient - patient requests chaplain consult to create a medical advance directive)      Information is confidential and restricted. Go to Review Flowsheets to unlock data.    Current Medications (verified) Outpatient Encounter Medications as of 03/10/2022  Medication Sig   alendronate (FOSAMAX) 70 MG tablet Take 1 tablet (70 mg  total) by mouth every 7 (seven) days. Take with a full glass of water on an empty stomach.   aspirin 81 MG tablet Take 1 tablet by mouth daily.   atorvastatin (LIPITOR) 20 MG tablet TAKE 1 TABLET BY MOUTH EVERY DAY   blood glucose meter kit and supplies Please fill with Accu Check Guide meter and strips. Glucose testing to be done daily, prior to eating and as needed for symptoms associated with hyperglycemia or hypoglycemia  (FOR ICD-10 E10.9, E11.9).   Blood Glucose Monitoring Suppl (ACCU-CHEK GUIDE) w/Device KIT Blood sugar testing to be done daily prior to eating and as needed for signs and symptoms of hyperglycemia or hypoglycemia.   busPIRone (BUSPAR) 10 MG tablet TAKE 1/2 TO 1 TABLET BY MOUTH TWICE DAILY   citalopram (CELEXA) 40 MG tablet TAKE 1 TABLET BY MOUTH EVERY DAY   diclofenac Sodium (VOLTAREN) 1 % GEL Apply 4 g topically 4 (four) times daily.   diltiazem (CARDIZEM CD) 240 MG 24 hr capsule TAKE 1 CAPSULE(240 MG) BY MOUTH DAILY   FLUAD 0.5 ML SUSY ADM 0.5ML IM UTD   FLUAD QUADRIVALENT 0.5 ML injection    glucose blood (ACCU-CHEK GUIDE) test strip Blood sugar testing  to be done daily prior to eating and as needed for signs and symptoms of hyperglycemia or hypoglycemia.   Lancets 30G MISC Blood glucose testing to be done daily ane as needed   levothyroxine (SYNTHROID) 50 MCG tablet TAKE 1 TABLET BY MOUTH EVERY DAY IN THE MORNING   lisinopril (ZESTRIL) 20 MG tablet Take 1 tablet (20 mg total) by mouth daily.   loperamide (IMODIUM) 2 MG capsule Take 2 capsules (4 mg total) by mouth as needed for diarrhea or loose stools.   meloxicam (MOBIC) 15 MG tablet Take 15 mg by mouth daily.   metFORMIN (GLUCOPHAGE) 850 MG tablet TAKE 1 TABLET BY MOUTH TWICE A DAY WITH MEALS   pantoprazole (PROTONIX) 40 MG tablet TAKE 1 TABLET BY MOUTH TWICE A DAY   PFIZER COVID-19 VAC BIVALENT injection    promethazine (PHENERGAN) 25 MG tablet TAKE 1 TABLET BY MOUTH TWICE A DAY AS NEEDED FOR NAUSEA OR VOMITING    QUEtiapine (SEROQUEL) 50 MG tablet TAKE 1 TABLET BY MOUTH 3 TIMES DAILY. MAY TAKE 1 TABLET IN AFTERNOON IF NEEDED FOR SEVERE ANXIETY.   rOPINIRole (REQUIP) 0.25 MG tablet TAKE 1 TABLET (0.25 MG TOTAL) BY MOUTH 2 TIMES DAILY AT 12 NOON AND 4 PM.   sucralfate (CARAFATE) 1 g tablet TAKE 1 TABLET BY MOUTH FOUR TIMES A DAY AS NEEDED   tiZANidine (ZANAFLEX) 2 MG tablet TAKE 1 TABLET BY MOUTH TWICE DAILY AS NEEDED FOR MUSCLE SPASMS   [EXPIRED] traMADol (ULTRAM) 50 MG tablet Take 1 tablet (50 mg total) by mouth 3 (three) times daily as needed for up to 5 days.   Zoster Vaccine Adjuvanted [facility]) injection Shingles vaccine series - inject IM as directed .   [DISCONTINUED] metoprolol tartrate (LOPRESSOR) 25 MG tablet TAKE 1 TABLET BY MOUTH TWICE A DAY   [DISCONTINUED] zolpidem (AMBIEN) 10 MG tablet Take 1 tablet (10 mg total) by mouth at bedtime as needed for sleep.   hydrochlorothiazide (HYDRODIURIL) 12.5 MG tablet Take 1 tablet po QD prn edema (Patient not taking: Reported on 03/10/2022)   [DISCONTINUED] traMADol (ULTRAM) 50 MG tablet Take by mouth every 6 (six) hours as needed. (Patient not taking: Reported on 03/10/2022)   No facility-administered encounter medications on file as of 03/10/2022.    Allergies (verified) Patient has no known allergies.   History: Past Medical History:  Diagnosis Date   Anxiety    Depression    Diabetes mellitus, type II ([place])    GERD (gastroesophageal reflux disease)    Hypertension    Thyroid disease    Past Surgical History:  Procedure Laterality Date   ABDOMINAL HYSTERECTOMY     BREAST EXCISIONAL BIOPSY Bilateral 80s   benign   CATARACT EXTRACTION W/PHACO Right 10/14/2020   Procedure: CATARACT EXTRACTION PHACO AND INTRAOCULAR LENS PLACEMENT (IOC)RIGHT  DIABETIC 5.54 00:54.6;  Surgeon: Leandrew Koyanagi, MD;  Location: [facility];  Service: Ophthalmology;  Laterality: Right;   CATARACT EXTRACTION W/PHACO Left 10/28/2020   Procedure: CATARACT  EXTRACTION PHACO AND INTRAOCULAR LENS PLACEMENT ([place]) LEFT DIABETIC;  Surgeon: Leandrew Koyanagi, MD;  Location: [place];  Service: Ophthalmology;  Laterality: Left;  8.13 01:09.5   COLONOSCOPY WITH PROPOFOL N/A 04/12/2019   Procedure: COLONOSCOPY WITH PROPOFOL;  Surgeon: Jonathon Bellows, MD;  Location[facility] ENDOSCOPY;  Service: Gastroenterology;  Laterality: N/A;   JOINT REPLACEMENT Right 2019   Family History  Problem Relation Age of Onset   Anxiety disorder Mother    Colon cancer Mother    Heart disease  Father    Anxiety disorder Father    Obesity Sister    Anxiety disorder Sister    Depression Sister    Osteoporosis Sister    Breast cancer Paternal Aunt    Social History   Socioeconomic History   Marital status: Widowed    Spouse name: Not on file   Number of children: Not on file   Years of education: Not on file   Highest education level: Not on file  Occupational History   Not on file  Tobacco Use   Smoking status: Never   Smokeless tobacco: Never  Vaping Use   Vaping Use: Never used  Substance and Sexual Activity   Alcohol use: No    Alcohol/week: 0.0 standard drinks of alcohol   Drug use: Never   Sexual activity: Never  Other Topics Concern   Not on file  Social History Narrative   Not on file   Social Determinants of Health   Financial Resource Strain: Low Risk  (03/10/2022)   Overall Financial Resource Strain (CARDIA)    Difficulty of Paying Living Expenses: Not hard at all  Food Insecurity: No Food Insecurity (03/10/2022)   Hunger Vital Sign    Worried About Running Out of Food in the Last Year: Never true    [place] in the Last Year: Never true  Transportation Needs: No Transportation Needs (03/10/2022)   PRAPARE - Hydrologist (Medical): No    Lack of Transportation (Non-Medical): No  Physical Activity: Insufficiently Active (03/10/2022)   Exercise Vital Sign    Days of Exercise per Week: 1 day    Minutes  of Exercise per Session: 60 min  Stress: No Stress Concern Present (03/10/2022)   [place]    Feeling of Stress : Not at all  Social Connections: Moderately Isolated (03/10/2022)   Social Connection and Isolation Panel [NHANES]    Frequency of Communication with Friends and Family: More than three times a week    Frequency of Social Gatherings with Friends and Family: More than three times a week    Attends Religious Services: Never    Marine scientist or Organizations: Yes    Attends Archivist Meetings: Never    Marital Status: Widowed    Tobacco Counseling Counseling given: Not Answered   Clinical Intake:  Pre-visit preparation completed: Yes  Pain Score: 6      BMI - recorded: 36.96 Nutritional Status: BMI > 30  Obese Diabetes: Yes  How often do you need to have someone help you when you read instructions, pamphlets, or other written materials from your doctor or pharmacy?: 1 - Never  Diabetic?Yes  Interpreter Needed?: No      Activities of Daily Living   Row Labels 03/10/2022    1:27 PM 08/04/2021   11:02 AM  In your present state of health, do you have any difficulty performing the following activities:   Section Header. No data exists in this row.    Hearing?   0 0  Vision?   0 0  Difficulty concentrating or making decisions?   0 0  Walking or climbing stairs?   1 1  Dressing or bathing?   0 0  Doing errands, shopping?   1 1    Patient Care Team: Ronnell Freshwater, NP as PCP - General (Family Medicine)  Indicate any recent Medical Services you may have received from other  than [facility] providers in the past year (date may be approximate).     Assessment:  1. Encounter for Medicare annual wellness exam Annual medicare wellness visit today   2. Type 2 diabetes mellitus with hyperglycemia, without long-term current use of insulin ([place]) Lab orders given to have routine labs  checked, including HgbA1c and urine microalbumin  - Urine Microalbumin w/creat. ratio; Future - Hemoglobin A1c; Future - Hemoglobin A1c - Urine Microalbumin w/creat. ratio  3. Essential hypertension, benign Stable. Continue medications as prescribed  - Comprehensive metabolic panel; Future - Comprehensive metabolic panel  4. Mixed hyperlipidemia Routine, fasting labs ordered during today's visit.  - Lipid panel; Future - Comprehensive metabolic panel; Future - CBC; Future - CBC - Comprehensive metabolic panel - Lipid panel  5. Acquired hypothyroidism Check thyroid panel with labs  - TSH + free T4; Future - TSH + free T4  6. Vitamin D deficiency Check vitamin d level and treat deficiency as indicated.   - VITAMIN D 25 Hydroxy (Vit-D Deficiency, Fractures); Future - VITAMIN D 25 Hydroxy (Vit-D Deficiency, Fractures)  7. Acute low back pain with sciatica, sciatica laterality unspecified, unspecified back pain laterality Short term prescription for tramadol given. May be taken up to three times daily as needed for pain. She understands that she should not drive or work after  taking this medication as it can cause dizziness or drowsiness.  - traMADol (ULTRAM) 50 MG tablet; Take 1 tablet (50 mg total) by mouth 3 (three) times daily as needed for up to 5 days.  Dispense: 15 tablet; Refill: 0   Hearing/Vision screen No results found.  Depression Screen   Row Labels 03/10/2022    1:27 PM 03/09/2022    9:09 AM 11/04/2021   10:21 AM 08/04/2021   11:02 AM 04/27/2021   10:18 AM 03/09/2021   11:25 AM 01/25/2021   10:42 AM  PHQ 2/9 Scores   Section Header. No data exists in this row.         PHQ - 2 Score   0  0 0 0 1 0  PHQ- 9 Score   '3  2 1 4 3 '$ 0  Exception Documentation    Other- indicate reason in comment box       Not completed    Daughter does assessment         Fall Risk   Row Labels 03/10/2022    1:28 PM 03/09/2021   11:25 AM 01/25/2021   10:43 AM 11/13/2020   10:16 AM  08/24/2020   10:29 AM  Fall Risk    Section Header. No data exists in this row.       Falls in the past year?   0 0 0 0 1  Number falls in past yr:   0 0 0 0 0  Injury with Fall?   0 0 0 0 1  Follow up    Falls evaluation completed Falls evaluation completed Falls evaluation completed Falls evaluation completed    FALL RISK PREVENTION PERTAINING TO THE HOME:  Any stairs in or around the home? No  If so, are there any without handrails? No  Home free of loose throw rugs in walkways, pet beds, electrical cords, etc? Yes Adequate lighting in your home to reduce risk of falls? Yes   ASSISTIVE DEVICES UTILIZED TO PREVENT FALLS:  Life alert? No  Use of a cane, walker or w/c? Yes  Grab bars in the bathroom? No  Shower chair or bench in shower?  No  Elevated toilet seat or a handicapped toilet? No   TIMED UP AND GO:  Was the test performed? Yes .  Length of time to ambulate 10 feet: 10-20 sec.   Gait steady and fast without use of assistive device  Cognitive Function:   Row Labels 01/06/2020    1:58 PM 12/28/2017    3:33 PM  MMSE - Mini Mental State Exam   Section Header. No data exists in this row.    Orientation to time   5 5  Orientation to Place   5 5  Registration   3 3  Attention/ Calculation   5 5  Recall   3 3  Language- name 2 objects   2 2  Language- repeat   1 1  Language- follow 3 step command   3 3  Language- read & follow direction   1 1  Write a sentence   1 1  Copy design   1 0  Total score   30 29       Row Labels 03/10/2022    1:07 PM 01/25/2021   10:46 AM  6CIT Screen   Section Header. No data exists in this row.    What Year?   0 points 0 points  What month?   0 points 0 points  What time?   0 points 0 points  Count back from 20   0 points 0 points  Months in reverse   0 points 0 points  Repeat phrase   0 points 2 points  Total Score   0 points 2 points    Immunizations Immunization History  Administered Date(s) Administered   Fluad  Quad(high Dose 65+) 12/07/2018, 11/25/2019   Influenza, High Dose Seasonal PF 11/18/2015, 09/26/2018   Influenza, Quadrivalent, Recombinant, Inj, Pf 10/21/2021   Influenza-Unspecified 11/07/2017, 11/26/2020   PFIZER(Purple Top)SARS-COV-2 Vaccination 04/11/2019, 05/02/2019, 11/02/2019, 06/08/2020, 11/26/2020   PNEUMOCOCCAL CONJUGATE-20 08/09/2020   Pfizer Covid-19 Vaccine Bivalent Booster 5y-11y 07/14/2021   Pneumococcal Polysaccharide-23 10/20/2017   Rsv, Bivalent, Protein Subunit Rsvpref,pf [place] Lance) 10/21/2021   Tdap 01/25/2021   Zoster Recombinat (Shingrix) 04/01/2020, 06/08/2020   Zoster, Live 03/30/2020    TDAP status: Up to date  Flu Vaccine status: Up to date  Pneumococcal vaccine status: Up to date  Covid-19 vaccine status: Completed vaccines  Qualifies for Shingles Vaccine? Yes   Zostavax completed Yes   Shingrix Completed?: Yes  Screening Tests Health Maintenance  Topic Date Due   Diabetic kidney evaluation - Urine ACR  Never done   COVID-19 Vaccine (7 - 2023-24 season) 10/08/2021   FOOT EXAM  01/25/2022   HEMOGLOBIN A1C  05/05/2022   OPHTHALMOLOGY EXAM  06/05/2022   Diabetic kidney evaluation - eGFR measurement  10/19/2022   Medicare Annual Wellness (AWV)  03/11/2023   DTaP/Tdap/Td (2 - Td or Tdap) 01/26/2031   Pneumonia Vaccine 98+ Years old  Completed   INFLUENZA VACCINE  Completed   DEXA SCAN  Completed   Hepatitis C Screening  Completed   Zoster Vaccines- Shingrix  Completed   HPV VACCINES  Aged Out   COLONOSCOPY (Pts 45-84yr Insurance coverage will need to be confirmed)  Discontinued    Health Maintenance  Health Maintenance Due  Topic Date Due   Diabetic kidney evaluation - Urine ACR  Never done   COVID-19 Vaccine (7 - 2023-24 season) 10/08/2021   FOOT EXAM  01/25/2022    Colorectal cancer screening: No longer required.   Mammogram status: Completed 05/18/21. Repeat every  year  Bone Density status: Completed 05/18/21. Results reflect: Bone  density results: OSTEOPENIA. Repeat every 2 years.  Lung Cancer Screening: (Low Dose CT Chest recommended if Age 50-80 years, 30 pack-year currently smoking OR have quit w/in 15years.) does not qualify.   Lung Cancer Screening Referral: N/A  Additional Screening:  Hepatitis C Screening: does not qualify; Completed   Vision Screening: Recommended annual ophthalmology exams for early detection of glaucoma and other disorders of the eye. Is the patient up to date with their annual eye exam?  Yes  Who is the provider or what is the name of the office in which the patient attends annual eye exams? [facility] If pt is not established with a provider, would they like to be referred to a provider to establish care?  Established .   Dental Screening: Recommended annual dental exams for proper oral hygiene  Community Resource Referral / Chronic Care Management: CRR required this visit?  No   CCM required this visit?  No      Plan:     I have personally reviewed and noted the following in the patient's chart:   Medical and social history Use of alcohol, tobacco or illicit drugs  Current medications and supplements including opioid prescriptions. Patient is not currently taking opioid prescriptions. Functional ability and status Nutritional status Physical activity Advanced directives List of other physicians Hospitalizations, surgeries, and ER visits in previous 12 months Vitals Screenings to include cognitive, depression, and falls Referrals and appointments  In addition, I have reviewed and discussed with patient certain preventive protocols, quality metrics, and best practice recommendations. A written personalized care plan for preventive services as well as general preventive health recommendations were provided to patient.     Ronnell Freshwater, NP   04/03/2022   Nurse Notes: face to face 20 min

## 2022-03-14 ENCOUNTER — Other Ambulatory Visit: Payer: Self-pay | Admitting: Nurse Practitioner

## 2022-03-14 DIAGNOSIS — I10 Essential (primary) hypertension: Secondary | ICD-10-CM

## 2022-04-02 ENCOUNTER — Other Ambulatory Visit: Payer: Self-pay | Admitting: Nurse Practitioner

## 2022-04-02 DIAGNOSIS — E782 Mixed hyperlipidemia: Secondary | ICD-10-CM

## 2022-04-02 DIAGNOSIS — F322 Major depressive disorder, single episode, severe without psychotic features: Secondary | ICD-10-CM

## 2022-04-02 DIAGNOSIS — I10 Essential (primary) hypertension: Secondary | ICD-10-CM

## 2022-04-30 ENCOUNTER — Other Ambulatory Visit: Payer: Self-pay | Admitting: Nurse Practitioner

## 2022-04-30 DIAGNOSIS — K219 Gastro-esophageal reflux disease without esophagitis: Secondary | ICD-10-CM

## 2022-04-30 DIAGNOSIS — I10 Essential (primary) hypertension: Secondary | ICD-10-CM

## 2022-04-30 DIAGNOSIS — E1165 Type 2 diabetes mellitus with hyperglycemia: Secondary | ICD-10-CM

## 2022-05-09 ENCOUNTER — Emergency Department: Payer: Medicare Other

## 2022-05-09 ENCOUNTER — Observation Stay
Admission: EM | Admit: 2022-05-09 | Discharge: 2022-05-10 | Disposition: A | Discharge status: Home / Self Care | Payer: Medicare Other | Attending: Internal Medicine | Admitting: Internal Medicine

## 2022-05-09 ENCOUNTER — Other Ambulatory Visit: Payer: Self-pay

## 2022-05-09 DIAGNOSIS — R269 Unspecified abnormalities of gait and mobility: Secondary | ICD-10-CM | POA: Insufficient documentation

## 2022-05-09 DIAGNOSIS — Z7984 Long term (current) use of oral hypoglycemic drugs: Secondary | ICD-10-CM | POA: Insufficient documentation

## 2022-05-09 DIAGNOSIS — Z79899 Other long term (current) drug therapy: Secondary | ICD-10-CM | POA: Insufficient documentation

## 2022-05-09 DIAGNOSIS — R42 Dizziness and giddiness: Secondary | ICD-10-CM

## 2022-05-09 DIAGNOSIS — E1165 Type 2 diabetes mellitus with hyperglycemia: Secondary | ICD-10-CM | POA: Diagnosis not present

## 2022-05-09 DIAGNOSIS — R2681 Unsteadiness on feet: Secondary | ICD-10-CM

## 2022-05-09 DIAGNOSIS — I639 Cerebral infarction, unspecified: Secondary | ICD-10-CM

## 2022-05-09 DIAGNOSIS — Z7982 Long term (current) use of aspirin: Secondary | ICD-10-CM | POA: Diagnosis not present

## 2022-05-09 DIAGNOSIS — R55 Syncope and collapse: Principal | ICD-10-CM | POA: Insufficient documentation

## 2022-05-09 DIAGNOSIS — R739 Hyperglycemia, unspecified: Secondary | ICD-10-CM

## 2022-05-09 DIAGNOSIS — I10 Essential (primary) hypertension: Secondary | ICD-10-CM | POA: Diagnosis not present

## 2022-05-09 DIAGNOSIS — C9110 Chronic lymphocytic leukemia of B-cell type not having achieved remission: Secondary | ICD-10-CM

## 2022-05-09 DIAGNOSIS — Z9181 History of falling: Secondary | ICD-10-CM

## 2022-05-09 DIAGNOSIS — D72829 Elevated white blood cell count, unspecified: Secondary | ICD-10-CM | POA: Insufficient documentation

## 2022-05-09 DIAGNOSIS — R531 Weakness: Secondary | ICD-10-CM | POA: Diagnosis not present

## 2022-05-09 DIAGNOSIS — M6281 Muscle weakness (generalized): Secondary | ICD-10-CM | POA: Diagnosis not present

## 2022-05-09 DIAGNOSIS — R001 Bradycardia, unspecified: Secondary | ICD-10-CM | POA: Insufficient documentation

## 2022-05-09 LAB — CBC
HCT: 35.1 % — ABNORMAL LOW (ref 36.0–46.0)
Hemoglobin: 10 g/dL — ABNORMAL LOW (ref 12.0–15.0)
MCH: 21.6 pg — ABNORMAL LOW (ref 26.0–34.0)
MCHC: 28.5 g/dL — ABNORMAL LOW (ref 30.0–36.0)
MCV: 76 fL — ABNORMAL LOW (ref 80.0–100.0)
Platelets: 402 10*3/uL — ABNORMAL HIGH (ref 150–400)
RBC: 4.62 MIL/uL (ref 3.87–5.11)
RDW: 16.9 % — ABNORMAL HIGH (ref 11.5–15.5)
WBC: 16.3 10*3/uL — ABNORMAL HIGH (ref 4.0–10.5)
nRBC: 0 % (ref 0.0–0.2)

## 2022-05-09 LAB — URINALYSIS, ROUTINE W REFLEX MICROSCOPIC
Bilirubin Urine: NEGATIVE
Glucose, UA: 150 mg/dL — AB
Hgb urine dipstick: NEGATIVE
Ketones, ur: NEGATIVE mg/dL
Nitrite: NEGATIVE
Protein, ur: NEGATIVE mg/dL
Specific Gravity, Urine: 1.009 (ref 1.005–1.030)
pH: 5 (ref 5.0–8.0)

## 2022-05-09 LAB — BASIC METABOLIC PANEL
Anion gap: 14 (ref 5–15)
BUN: 8 mg/dL (ref 8–23)
CO2: 21 mmol/L — ABNORMAL LOW (ref 22–32)
Calcium: 9 mg/dL (ref 8.9–10.3)
Chloride: 100 mmol/L (ref 98–111)
Creatinine, Ser: 0.92 mg/dL (ref 0.44–1.00)
GFR, Estimated: >60 mL/min (ref >60)
Glucose, Bld: 263 mg/dL — ABNORMAL HIGH (ref 70–99)
Potassium: 3.8 mmol/L (ref 3.5–5.1)
Sodium: 135 mmol/L (ref 135–145)

## 2022-05-09 LAB — TROPONIN I (HIGH SENSITIVITY)
Troponin I (High Sensitivity): 8 ng/L (ref <18)
Troponin I (High Sensitivity): 12 ng/L (ref <18)

## 2022-05-09 LAB — CBG MONITORING, ED: Glucose-Capillary: 152 mg/dL — ABNORMAL HIGH (ref 70–99)

## 2022-05-09 MED ORDER — SODIUM CHLORIDE 0.9% FLUSH
3.0000 mL | Freq: Two times a day (BID) | INTRAVENOUS | Status: DC
  Administered 2022-05-10: 3 mL via INTRAVENOUS

## 2022-05-09 MED ORDER — ENOXAPARIN SODIUM 40 MG/0.4ML IJ SOSY
40.0000 mg | PREFILLED_SYRINGE | INTRAMUSCULAR | Status: DC
  Administered 2022-05-10: 40 mg via SUBCUTANEOUS
  Filled 2022-05-09: qty 0.4

## 2022-05-09 MED ORDER — INSULIN ASPART 100 UNIT/ML IJ SOLN: 0.0000 [IU] | Freq: Every day | INTRAMUSCULAR | Status: DC

## 2022-05-09 MED ORDER — INSULIN ASPART 100 UNIT/ML IJ SOLN
0.0000 [IU] | Freq: Three times a day (TID) | INTRAMUSCULAR | Status: DC
  Administered 2022-05-10: 3 [IU] via SUBCUTANEOUS
  Administered 2022-05-10: 2 [IU] via SUBCUTANEOUS
  Filled 2022-05-09 (×2): qty 1

## 2022-05-09 MED ORDER — LACTATED RINGERS IV BOLUS
1000.0000 mL | Freq: Once | INTRAVENOUS | Status: AC
  Administered 2022-05-09: 1000 mL via INTRAVENOUS

## 2022-05-09 MED ORDER — ZOLPIDEM TARTRATE 5 MG PO TABS
10.0000 mg | ORAL_TABLET | Freq: Every evening | ORAL | Status: DC | PRN
  Administered 2022-05-09: 10 mg via ORAL
  Filled 2022-05-09: qty 2

## 2022-05-09 MED ORDER — ASPIRIN 81 MG PO TBEC
81.0000 mg | DELAYED_RELEASE_TABLET | Freq: Every day | ORAL | Status: DC
  Administered 2022-05-09 – 2022-05-10 (×2): 81 mg via ORAL
  Filled 2022-05-09 (×2): qty 1

## 2022-05-09 MED ORDER — ACETAMINOPHEN 325 MG PO TABS: 650.0000 mg | ORAL_TABLET | Freq: Four times a day (QID) | ORAL | Status: DC | PRN

## 2022-05-09 MED ORDER — ATORVASTATIN CALCIUM 20 MG PO TABS
40.0000 mg | ORAL_TABLET | Freq: Every day | ORAL | Status: DC
  Administered 2022-05-09 – 2022-05-10 (×2): 40 mg via ORAL
  Filled 2022-05-09 (×2): qty 2

## 2022-05-09 MED ORDER — ACETAMINOPHEN 650 MG RE SUPP: 650.0000 mg | Freq: Four times a day (QID) | RECTAL | Status: DC | PRN

## 2022-05-09 NOTE — Assessment & Plan Note (Signed)
No cocnern for acute CVA given presentation. Will start/continue aspirin and statin medications.

## 2022-05-09 NOTE — Assessment & Plan Note (Signed)
Will request a physical therapy evaluation

## 2022-05-09 NOTE — Assessment & Plan Note (Signed)
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  At this time we will hold off on patient metoprolol, maintain patient on telemetry, recheck troponin.  Proceed from there we will get an echo, thyroid profile

## 2022-05-09 NOTE — Assessment & Plan Note (Signed)
Physical therapy evaluation °

## 2022-05-09 NOTE — ED Provider Notes (Signed)
[facility] Provider Note    Event Date/Time   First MD Initiated Contact with Patient 05/09/22 1556     (approximate)   History   Dizziness   HPI  [name] is a 77 y.o. female past medical history of diabetes, hypertension, GERD who presents because of lightheadedness and dizziness.  Patient was in her normal state of health when she got the car today around 1 PM she felt extremely lightheaded and fatigued.  Did feel improved when she sat down but has continued to feel lightheaded with standing.  Denies any associated chest pain palpitations dyspnea.  Says she is been feeling like she had a bug over the last several days just been more tired has still been eating and drinking without vomiting or diarrhea.  Denies any urinary symptoms.  Does take both metoprolol and diltiazem due to history of palpitations.  Has never seen cardiology.     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 08/25/2020   Encounter to establish care 05/25/2020   Mixed hyperlipidemia 05/25/2020   Need for shingles vaccine 01/26/2020   Age-related cataract 12/29/2019   Mitral valve prolapse 03/25/2019   Gastroesophageal reflux disease without esophagitis 03/25/2019   Restless leg syndrome 10/05/2018   Acute eczematoid otitis externa of left ear 08/21/2018   Age related osteoporosis 08/21/2018   Vaginal yeast infection 06/18/2018   Encounter for general adult medical examination with abnormal findings 12/30/2017   Acute low back pain with sciatica 12/30/2017   Fatigue due  to sleep pattern disturbance 12/30/2017   Ovarian failure 09/27/2017   Chronic right-sided low back pain with right-sided sciatica 09/27/2017   Essential hypertension, benign 08/05/2017   Type 2 diabetes mellitus with hyperglycemia, without long-term current use of insulin 08/05/2017   Hypothyroidism 08/05/2017   Depression, major, single episode, severe 08/05/2017   Need for vaccination against Streptococcus pneumoniae using pneumococcal conjugate vaccine 13 08/05/2017   Body mass index (BMI) of 35.0-35.9 in adult 04/07/2017   Acute ischemic colitis 09/12/2016   Nausea 09/12/2016   Acute urinary retention 09/12/2016   Acute renal insufficiency 09/12/2016   Colitis 09/12/2016   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   Primary osteoarthritis of right knee 07/28/2016   Depression, major, recurrent, moderate ([place]) 07/22/2014   Anxiety, generalized 07/22/2014   Other insomnia 07/22/2014   Panic disorder without agoraphobia 07/22/2014   H/O: obesity 06/01/2014   H/O: HTN (hypertension) 06/01/2014   H/O gastrointestinal disease 06/01/2014   H/O diabetes mellitus 06/01/2014   H/O elevated lipids 06/01/2014   History of hay fever 06/01/2014     Physical Exam  Triage Vital Signs: ED Triage Vitals  Enc Vitals Group     BP 05/09/22 1532 125/62     Pulse Rate 05/09/22 1532 (!) 52     Resp 05/09/22 1532 18     Temp 05/09/22 1532 98.1 F (36.7 C)     Temp Source 05/09/22 1532 Oral     SpO2 05/09/22 1532 96 %     Weight --  Height --      Head Circumference --      Peak Flow --      Pain Score 05/09/22 1530 7     Pain Loc --      Pain Edu? --      Excl. in [place]? --     Most recent vital signs: Vitals:   05/09/22 1830 05/09/22 1900  BP:    Pulse: 75 76  Resp: 17 15  Temp:    SpO2: 95% 97%     General: Awake, no distress.  CV:  Good peripheral perfusion. No edema Resp:  Normal effort.  Abd:  No distention. Soft nontender Neuro:              Awake, Alert, Oriented x 3  Other:     ED Results / Procedures / Treatments  Labs (all labs ordered are listed, but only abnormal results are displayed) Labs Reviewed  BASIC METABOLIC PANEL - Abnormal; Notable for the following components:      Result Value   CO2 21 (*)    Glucose, Bld 263 (*)    All other components within normal limits  CBC - Abnormal; Notable for the following components:   WBC 16.3 (*)    Hemoglobin 10.0 (*)    HCT 35.1 (*)    MCV 76.0 (*)    MCH 21.6 (*)    MCHC 28.5 (*)    RDW 16.9 (*)    Platelets 402 (*)    All other components within normal limits  URINALYSIS, ROUTINE W REFLEX MICROSCOPIC  CBG MONITORING, ED  TROPONIN I (HIGH SENSITIVITY)  TROPONIN I (HIGH SENSITIVITY)     EKG  Initial EKG reviewed interpreted myself shows a left bundle branch block rates in the 50s without discernible P waves no acute ischemic changes   RADIOLOGY I reviewed and interpreted the CT scan of the brain which does not show any acute intracranial process    PROCEDURES:  Critical Care performed: No  Procedures  The patient is on the cardiac monitor to evaluate for evidence of arrhythmia and/or significant heart rate changes.   MEDICATIONS ORDERED IN ED: Medications  lactated ringers bolus 1,000 mL (1,000 mLs Intravenous New Bag/Given 05/09/22 1830)     IMPRESSION / MDM / ASSESSMENT AND PLAN / ED COURSE  I reviewed the triage vital signs and the nursing notes.                              Patient's presentation is most consistent with acute complicated illness / injury requiring diagnostic workup.  Differential diagnosis includes, but is not limited to, vasovagal episode, orthostatic hypotension, dehydration, symptomatic bradycardia  The patient is a 77 year old female who presents because of lightheadedness.  Patient had acute onset of lightheadedness after standing around 1 PM today.  She felt improved with sitting down but not completely back to  baseline.  Denies any chest pain or dyspnea.  On arrival to ED heart rates in the 50s.  Initial EKG showing left bundle branch block without discernible P waves with rates in the 50s.  Patient was then noted to go back into sinus rhythm with heart rates closer to 70 to 80s.  We did discuss with her daughter she is taking metoprolol diltiazem, patient was not aware.  Thinks this is because of history of palpitations.  No prior diagnosis of A-fib or flutter and does not see a cardiologist.  Patient's labs overall reassuring.  Does have leukocytosis of 16.  Hemoglobin is 10 which is not far off baseline.  Troponin negative.  Given patient was presyncopal with what I would presume to be a junctional escape rhythm I do think that she should be observed in the hospital on telemetry and likely have the metoprolol/diltiazem above held.  Patient did receive a bolus of fluid.       FINAL CLINICAL IMPRESSION(S) / ED DIAGNOSES   Final diagnoses:  Postural dizziness with presyncope     Rx / DC Orders   ED Discharge Orders     None        Note:  This document was prepared using Dragon voice recognition software and may include unintentional dictation errors.   Rada Hay, MD 05/09/22 [phone]

## 2022-05-09 NOTE — ED Triage Notes (Addendum)
Pt to ED via POV from home. Pt reports dizziness and generalized weakness started 1hr PTA. Pt denies blurry vision or HA. Pt denies hx of stroke. Pt not on blood thinners. Pt reports she feels like she is having an MI and reports a funny feeling in her chest. Pt appears pale.

## 2022-05-09 NOTE — H&P (Signed)
History and Physical    Patient: [name] [id number] DOB: 13-Feb-1945 DOA: 05/09/2022 DOS: the patient was seen and examined on 05/09/2022 PCP: Ronnell Freshwater, NP  Patient coming from: Home  Chief Complaint:  Chief Complaint  Patient presents with   Dizziness   HPI: [name] is a 77 y.o. female with medical history significant of reported orthostatic hypotension as per patient.  Therefore patient always gets a little presyncopal when she gets up quickly from a laying down position.  Patient also reports that over the last 2 months she has had increasing sensation of high risk of falling/imbalance when she walks.  Typically patient uses a cane to walk and she has been increasingly unsteady over the last 2 weeks.  There is no focal weakness, no tremor, no actual trauma from fall.  Patient was in her usual state of health when she got up this morning.  At approximately 1 PM she was in the parking lot of [place] when she reports walking to urgent and feeling that she is going to "go down to the ground ".  Therefore patient was helped back to the car and brought to [facility] ER.  Patient does not report any vertiginous symptoms such as vomiting, visual disturbance or spinning sensation of the world.  Patient similarly does not describe any presyncopal symptoms such as darkening of the world or loss of vision or having high concern that she may pass out.  She denies any focal weakness palpitation chest pain trouble breathing left cramp any trauma.  Patient has since been able to ambulate to the bathroom and back in the ER without any falls.  Patient is currently in stretcher and totally asymptomatic.  ER course is notable for the patient being found to have bradycardia see below Review of Systems: As mentioned in the history of present illness. All other systems reviewed and are negative. Past Medical History:  Diagnosis Date   Anxiety    Depression    Diabetes mellitus, type  II    GERD (gastroesophageal reflux disease)    Hypertension    Thyroid disease    Past Surgical History:  Procedure Laterality Date   ABDOMINAL HYSTERECTOMY     BREAST EXCISIONAL BIOPSY Bilateral 80s   benign   CATARACT EXTRACTION W/PHACO Right 10/14/2020   Procedure: CATARACT EXTRACTION PHACO AND INTRAOCULAR LENS PLACEMENT (I[facility])RIGHT  DIABETIC 5.54 00:54.6;  Surgeon: Leandrew Koyanagi, MD;  Location: [place];  Service: Ophthalmology;  Laterality: Right;   CATARACT EXTRACTION W/PHACO Left 10/28/2020   Procedure: CATARACT EXTRACTION PHACO AND INTRAOCULAR LENS PLACEMENT ([place]) LEFT DIABETIC;  Surgeon: Leandrew Koyanagi, MD;  Location: [place];  Service: Ophthalmology;  Laterality: Left;  8.13 01:09.5   COLONOSCOPY WITH PROPOFOL N/A 04/12/2019   Procedure: COLONOSCOPY WITH PROPOFOL;  Surgeon: Jonathon Bellows, MD;  Location: [facility];  Service: Gastroenterology;  Laterality: N/A;   JOINT REPLACEMENT Right 2019   Social History:  reports that she has never smoked. She has never used smokeless tobacco. She reports that she does not drink alcohol and does not use drugs.  No Known Allergies  Family History  Problem Relation Age of Onset   Anxiety disorder Mother    Colon cancer Mother    Heart disease Father    Anxiety disorder Father    Obesity Sister    Anxiety disorder Sister    Depression Sister    Osteoporosis Sister    Breast cancer Paternal Aunt  Prior to Admission medications   Medication Sig Start Date End Date Taking? Authorizing Provider  alendronate (FOSAMAX) 70 MG tablet Take 1 tablet (70 mg total) by mouth every 7 (seven) days. Take with a full glass of water on an empty stomach. 08/04/21   Ronnell Freshwater, NP  aspirin 81 MG tablet Take 1 tablet by mouth daily.    [provider]  atorvastatin (LIPITOR) 20 MG tablet TAKE 1 TABLET BY MOUTH EVERY DAY 04/04/22   Ronnell Freshwater, NP  blood glucose meter kit and supplies Please fill  with Accu Check Guide meter and strips. Glucose testing to be done daily, prior to eating and as needed for symptoms associated with hyperglycemia or hypoglycemia  (FOR ICD-10 E10.9, E11.9). 05/18/21   Ronnell Freshwater, NP  Blood Glucose Monitoring Suppl (ACCU-CHEK GUIDE) w/Device KIT Blood sugar testing to be done daily prior to eating and as needed for signs and symptoms of hyperglycemia or hypoglycemia. 05/18/21   Ronnell Freshwater, NP  busPIRone (BUSPAR) 10 MG tablet TAKE 1/2 TO 1 TABLET BY MOUTH TWICE DAILY 01/28/22   Ronnell Freshwater, NP  citalopram (CELEXA) 40 MG tablet TAKE 1 TABLET BY MOUTH EVERY DAY 04/04/22   Ronnell Freshwater, NP  diclofenac Sodium (VOLTAREN) 1 % GEL Apply 4 g topically 4 (four) times daily. 05/25/20   Ronnell Freshwater, NP  diltiazem (CARDIZEM CD) 240 MG 24 hr capsule TAKE 1 CAPSULE(240 MG) BY MOUTH DAILY 04/04/22   Ronnell Freshwater, NP  FLUAD 0.5 ML SUSY ADM 0.5ML IM UTD 10/20/17   [provider]  FLUAD QUADRIVALENT 0.5 ML injection  11/26/20   [provider]  glucose blood (ACCU-CHEK GUIDE) test strip Blood sugar testing to be done daily prior to eating and as needed for signs and symptoms of hyperglycemia or hypoglycemia. 05/18/21   Ronnell Freshwater, NP  hydrochlorothiazide (HYDRODIURIL) 12.5 MG tablet Take 1 tablet po QD prn edema Patient not taking: Reported on 03/10/2022 11/13/20   Ronnell Freshwater, NP  Lancets 30G MISC Blood glucose testing to be done daily ane as needed 05/18/21   Ronnell Freshwater, NP  levothyroxine (SYNTHROID) 50 MCG tablet TAKE 1 TABLET BY MOUTH EVERY DAY IN THE MORNING 01/28/22   Ronnell Freshwater, NP  lisinopril (ZESTRIL) 20 MG tablet TAKE 1 TABLET BY MOUTH EVERY DAY 05/02/22   Ronnell Freshwater, NP  loperamide (IMODIUM) 2 MG capsule Take 2 capsules (4 mg total) by mouth as needed for diarrhea or loose stools. 03/09/21   Ronnell Freshwater, NP  meloxicam (MOBIC) 15 MG tablet Take 15 mg by mouth daily.    [provider]   metFORMIN (GLUCOPHAGE) 850 MG tablet TAKE 1 TABLET BY MOUTH TWICE A DAY WITH FOOD 05/02/22   Ronnell Freshwater, NP  metoprolol tartrate (LOPRESSOR) 25 MG tablet TAKE 1 TABLET BY MOUTH TWICE A DAY 03/14/22   Ronnell Freshwater, NP  pantoprazole (PROTONIX) 40 MG tablet TAKE 1 TABLET BY MOUTH TWICE A DAY 05/02/22   Ronnell Freshwater, NP  PFIZER COVID-19 VAC BIVALENT injection  11/26/20   [provider]  promethazine (PHENERGAN) 25 MG tablet TAKE 1 TABLET BY MOUTH TWICE A DAY AS NEEDED FOR NAUSEA OR VOMITING 01/10/22   Boscia, Greer Ee, NP  QUEtiapine (SEROQUEL) 50 MG tablet TAKE 1 TABLET BY MOUTH 3 TIMES DAILY. MAY TAKE 1 TABLET IN AFTERNOON IF NEEDED FOR SEVERE ANXIETY. 02/03/22   Ronnell Freshwater, NP  rOPINIRole (  REQUIP) 0.25 MG tablet TAKE 1 TABLET (0.25 MG TOTAL) BY MOUTH 2 TIMES DAILY AT 12 NOON AND 4 PM. 11/29/21   Ronnell Freshwater, NP  sucralfate (CARAFATE) 1 g tablet TAKE 1 TABLET BY MOUTH FOUR TIMES A DAY AS NEEDED 11/29/21   Ronnell Freshwater, NP  tiZANidine (ZANAFLEX) 2 MG tablet TAKE 1 TABLET BY MOUTH TWICE DAILY AS NEEDED FOR MUSCLE SPASMS 10/07/21   Ronnell Freshwater, NP  Zoster Vaccine Adjuvanted [facility]) injection Shingles vaccine series - inject IM as directed . 01/06/20   Ronnell Freshwater, NP    Physical Exam: Vitals:   05/09/22 1830 05/09/22 1900 05/09/22 1945 05/09/22 1945  BP:   139/80   Pulse: 75 76 76   Resp: 17 15 (!) 27   Temp:    98.3 F (36.8 C)  TempSrc:    Oral  SpO2: 95% 97% 93%    General: Obese lady, no distress apparent Respiratory exam: Bilateral intravesicular Cardiovascular exam S1-S2 normal Abdomen soft nontender Extremities warm without edema Gazes are equal and free, bilateral tympanic membrane healthy although right side was partially obstructed by wax Will equal round No cerebellar signs elicited Bilateral 5/5 strength in all 4 extremities. Data Reviewed:  Labs on Admission:  Results for orders placed or performed during the hospital  encounter of 05/09/22 (from the past 24 hour(s))  Basic metabolic panel     Status: Abnormal   Collection Time: 05/09/22  3:31 PM  Result Value Ref Range   Sodium 135 135 - 145 mmol/L   Potassium 3.8 3.5 - 5.1 mmol/L   Chloride 100 98 - 111 mmol/L   CO2 21 (L) 22 - 32 mmol/L   Glucose, Bld 263 (H) 70 - 99 mg/dL   BUN 8 8 - 23 mg/dL   Creatinine, Ser 0.92 0.44 - 1.00 mg/dL   Calcium 9.0 8.9 - 10.3 mg/dL   GFR, Estimated >60 >60 mL/min   Anion gap 14 5 - 15  CBC     Status: Abnormal   Collection Time: 05/09/22  3:31 PM  Result Value Ref Range   WBC 16.3 (H) 4.0 - 10.5 K/uL   RBC 4.62 3.87 - 5.11 MIL/uL   Hemoglobin 10.0 (L) 12.0 - 15.0 g/dL   HCT 35.1 (L) 36.0 - 46.0 %   MCV 76.0 (L) 80.0 - 100.0 fL   MCH 21.6 (L) 26.0 - 34.0 pg   MCHC 28.5 (L) 30.0 - 36.0 g/dL   RDW 16.9 (H) 11.5 - 15.5 %   Platelets 402 (H) 150 - 400 K/uL   nRBC 0.0 0.0 - 0.2 %  Troponin I (High Sensitivity)     Status: None   Collection Time: 05/09/22  3:31 PM  Result Value Ref Range   Troponin I (High Sensitivity) 8 <18 ng/L  Urinalysis, Routine w reflex microscopic -Urine, Clean Catch     Status: Abnormal   Collection Time: 05/09/22  7:07 PM  Result Value Ref Range   Color, Urine YELLOW (A) YELLOW   APPearance HAZY (A) CLEAR   Specific Gravity, Urine 1.009 1.005 - 1.030   pH 5.0 5.0 - 8.0   Glucose, UA 150 (A) NEGATIVE mg/dL   Hgb urine dipstick NEGATIVE NEGATIVE   Bilirubin Urine NEGATIVE NEGATIVE   Ketones, ur NEGATIVE NEGATIVE mg/dL   Protein, ur NEGATIVE NEGATIVE mg/dL   Nitrite NEGATIVE NEGATIVE   Leukocytes,Ua SMALL (A) NEGATIVE   RBC / HPF 0-5 0 - 5 RBC/hpf   WBC, UA 6-10  0 - 5 WBC/hpf   Bacteria, UA RARE (A) NONE SEEN   Squamous Epithelial / HPF 0-5 0 - 5 /HPF   Mucus PRESENT    Hyaline Casts, UA PRESENT    Radiological Exams on Admission:  CT HEAD WO CONTRAST  Result Date: 05/09/2022 CLINICAL DATA:  Dizziness, generalized weakness EXAM: CT HEAD WITHOUT CONTRAST TECHNIQUE: Contiguous  axial images were obtained from the base of the skull through the vertex without intravenous contrast. RADIATION DOSE REDUCTION: This exam was performed according to the departmental dose-optimization program which includes automated exposure control, adjustment of the mA and/or kV according to patient size and/or use of iterative reconstruction technique. COMPARISON:  None Available. FINDINGS: Brain: Scattered hypodensities within the periventricular white matter are consistent with age-indeterminate small vessel ischemic changes. No other signs of acute infarct or hemorrhage. Bilateral basal ganglia calcifications incidentally noted. Lateral ventricles and remaining midline structures are unremarkable. No acute extra-axial fluid collections. No mass effect. Vascular: No hyperdense vessel or unexpected calcification. Skull: Normal. Negative for fracture or focal lesion. Sinuses/Orbits: No acute finding. Other: None. IMPRESSION: 1. Scattered hypodensities within the periventricular white matter compatible with age-indeterminate small vessel ischemic change. 2. Otherwise no acute intracranial process. Electronically Signed   By: Randa Ngo M.D.   On: 05/09/2022 16:27    EKG: Independently reviewed. Junctional bradcycardia noted at 1534 reverteed to sinus thereafter.   Assessment and Plan: Cerebral infarction No cocnern for acute CVA given presentation. Will start/continue aspirin and statin medications.  Leukocytosis Check blood culture.  Hyperglycemia Insulin. While in hospital  Bradycardia Junctional bradycardia that is reverting to sinus rhythm.  At this time we will hold off on patient metoprolol, maintain patient on telemetry, recheck troponin.  Proceed from there we will get an echo, thyroid profile  Gait instability Physical therapy evaluation  At risk for fall due to comorbid condition Will request a physical therapy evaluation      Advance Care Planning:   Code Status: Prior  full code  Consults: na  Family Communication: per patient  Severity of Illness: The appropriate patient status for this patient is OBSERVATION. Observation status is judged to be reasonable and necessary in order to provide the required intensity of service to ensure the patient's safety. The patient's presenting symptoms, physical exam findings, and initial radiographic and laboratory data in the context of their medical condition is felt to place them at decreased risk for further clinical deterioration. Furthermore, it is anticipated that the patient will be medically stable for discharge from the hospital within 2 midnights of admission.   Author: Gertie Fey, MD 05/09/2022 8:47 PM  For on call review [email or web address].

## 2022-05-09 NOTE — Assessment & Plan Note (Signed)
Insulin. While in hospital

## 2022-05-09 NOTE — Assessment & Plan Note (Signed)
Check blood culture 

## 2022-05-10 ENCOUNTER — Encounter: Payer: Self-pay | Admitting: Internal Medicine

## 2022-05-10 ENCOUNTER — Observation Stay (HOSPITAL_BASED_OUTPATIENT_CLINIC_OR_DEPARTMENT_OTHER)
Admit: 2022-05-10 | Discharge: 2022-05-10 | Disposition: A | Discharge status: Home / Self Care | Payer: Medicare Other | Attending: Internal Medicine | Admitting: Internal Medicine

## 2022-05-10 DIAGNOSIS — R55 Syncope and collapse: Secondary | ICD-10-CM | POA: Diagnosis not present

## 2022-05-10 DIAGNOSIS — R9431 Abnormal electrocardiogram [ECG] [EKG]: Secondary | ICD-10-CM

## 2022-05-10 DIAGNOSIS — R269 Unspecified abnormalities of gait and mobility: Secondary | ICD-10-CM | POA: Diagnosis not present

## 2022-05-10 LAB — CBC
HCT: 29.6 % — ABNORMAL LOW (ref 36.0–46.0)
Hemoglobin: 8.9 g/dL — ABNORMAL LOW (ref 12.0–15.0)
MCH: 22.2 pg — ABNORMAL LOW (ref 26.0–34.0)
MCHC: 30.1 g/dL (ref 30.0–36.0)
MCV: 73.8 fL — ABNORMAL LOW (ref 80.0–100.0)
Platelets: 268 10*3/uL (ref 150–400)
RBC: 4.01 MIL/uL (ref 3.87–5.11)
RDW: 16.7 % — ABNORMAL HIGH (ref 11.5–15.5)
WBC: 12.7 10*3/uL — ABNORMAL HIGH (ref 4.0–10.5)
nRBC: 0 % (ref 0.0–0.2)

## 2022-05-10 LAB — BASIC METABOLIC PANEL
Anion gap: 9 (ref 5–15)
BUN: 8 mg/dL (ref 8–23)
CO2: 28 mmol/L (ref 22–32)
Calcium: 9.1 mg/dL (ref 8.9–10.3)
Chloride: 101 mmol/L (ref 98–111)
Creatinine, Ser: 0.61 mg/dL (ref 0.44–1.00)
GFR, Estimated: >60 mL/min (ref >60)
Glucose, Bld: 168 mg/dL — ABNORMAL HIGH (ref 70–99)
Potassium: 3.8 mmol/L (ref 3.5–5.1)
Sodium: 138 mmol/L (ref 135–145)

## 2022-05-10 LAB — LIPID PANEL
Cholesterol: 111 mg/dL (ref 0–200)
HDL: 35 mg/dL — ABNORMAL LOW (ref >40)
LDL Cholesterol: 55 mg/dL (ref 0–99)
Total CHOL/HDL Ratio: 3.2 RATIO
Triglycerides: 106 mg/dL (ref <150)
VLDL: 21 mg/dL (ref 0–40)

## 2022-05-10 LAB — ECHOCARDIOGRAM COMPLETE
AR max vel: 2.06 cm2
AV Area VTI: 2.17 cm2
AV Area mean vel: 2.09 cm2
AV Mean grad: 9 mmHg
AV Peak grad: 15.2 mmHg
Ao pk vel: 1.95 m/s
Area-P 1/2: 5.93 cm2
MV VTI: 3.64 cm2
S' Lateral: 2 cm
Weight: 2765.45 oz

## 2022-05-10 LAB — PROTIME-INR
INR: 1.1 (ref 0.8–1.2)
Prothrombin Time: 13.8 seconds (ref 11.4–15.2)

## 2022-05-10 LAB — GLUCOSE, CAPILLARY
Glucose-Capillary: 193 mg/dL — ABNORMAL HIGH (ref 70–99)
Glucose-Capillary: 216 mg/dL — ABNORMAL HIGH (ref 70–99)

## 2022-05-10 LAB — APTT: aPTT: 29 seconds (ref 24–36)

## 2022-05-10 MED ORDER — ATORVASTATIN CALCIUM 40 MG PO TABS: 40.0000 mg | ORAL_TABLET | Freq: Every day | ORAL | 3 refills | Status: DC

## 2022-05-10 MED ORDER — QUETIAPINE FUMARATE 25 MG PO TABS
50.0000 mg | ORAL_TABLET | Freq: Three times a day (TID) | ORAL | Status: DC
  Administered 2022-05-10: 50 mg via ORAL
  Filled 2022-05-10: qty 2

## 2022-05-10 NOTE — Discharge Summary (Signed)
Physician Discharge Summary   Patient: [name] MRN: [id number] DOB: 1945/12/31  Admit date:     05/09/2022  Discharge date: 05/10/22  Discharge Physician: Verline Lema   PCP: Ronnell Freshwater, NP     Discharge Diagnoses: Presyncope secondary to orthostatic hypotension Leukocytosis likely reactive with no evidence of sepsis  diabetes mellitus type 2 with hyperglycemia  Bradycardia-resolved Gait instability At risk for fall due to comorbid condition  Hospital Course: [name] is a 77 y.o. female with medical history significant of reported orthostatic hypotension as per patient.  Therefore patient always gets a little presyncopal when she gets up quickly from a laying down position.  Patient also reports that over the last 2 months she has had increasing sensation of high risk of falling/imbalance when she walks.  Typically patient uses a cane to walk and she has been increasingly unsteady over the last 2 weeks.  There is no focal weakness, no tremor, no actual trauma from fall.  Patient was in her usual state of health when she got up the morning of presentation.  She was at parking lot of [place] when she reports walking too urgent and feeling that she is going to "go down to the ground ".  Therefore patient was helped back to the car and brought to [facility] ER.  Patient does not report any vertiginous symptoms such as vomiting, visual disturbance or spinning sensation of the world.  Patient similarly does not describe any presyncopal symptoms such as darkening of the world or loss of vision or having high concern that she may pass out.  She denies any focal weakness palpitation chest pain trouble breathing left cramp any trauma.  Patient has since been able to ambulate to the bathroom without any falls.  She underwent echocardiogram.was also seen by PT and was able to walk with no PT needs on discharge.  She underwent orthostatic vitals and was found to be orthostatic  when she stood up immediately however this slowly corrected upon standing for 3 minutes.  Since this is a chronic problem patient has been counseled to keep herself well-hydrated and also to avoid standing up too quickly.  She will follow-up with her primary care physician.  Consultants: None Procedures performed: None Disposition: Home Diet recommendation:  Cardiac diet DISCHARGE MEDICATION: Allergies as of 05/10/2022   No Known Allergies      Medication List     STOP taking these medications    hydrochlorothiazide 12.5 MG tablet Commonly known as: HYDRODIURIL       TAKE these medications    alendronate 70 MG tablet Commonly known as: FOSAMAX Take 1 tablet (70 mg total) by mouth every 7 (seven) days. Take with a full glass of water on an empty stomach.   aspirin 81 MG tablet Take 1 tablet by mouth daily.   atorvastatin 40 MG tablet Commonly known as: LIPITOR Take 1 tablet (40 mg total) by mouth daily. Start taking on: May 11, 2022 What changed:  medication strength how much to take   busPIRone 10 MG tablet Commonly known as: BUSPAR TAKE 1/2 TO 1 TABLET BY MOUTH TWICE DAILY   citalopram 40 MG tablet Commonly known as: CELEXA TAKE 1 TABLET BY MOUTH EVERY DAY   diclofenac Sodium 1 % Gel Commonly known as: VOLTAREN Apply 4 g topically 4 (four) times daily.   diltiazem 240 MG 24 hr capsule Commonly known as: CARDIZEM CD TAKE 1 CAPSULE(240 MG) BY MOUTH DAILY  levothyroxine 50 MCG tablet Commonly known as: SYNTHROID TAKE 1 TABLET BY MOUTH EVERY DAY IN THE MORNING   lisinopril 20 MG tablet Commonly known as: ZESTRIL TAKE 1 TABLET BY MOUTH EVERY DAY   loperamide 2 MG capsule Commonly known as: IMODIUM Take 2 capsules (4 mg total) by mouth as needed for diarrhea or loose stools.   meloxicam 15 MG tablet Commonly known as: MOBIC Take 15 mg by mouth daily.   metFORMIN 850 MG tablet Commonly known as: GLUCOPHAGE TAKE 1 TABLET BY MOUTH TWICE A DAY WITH  FOOD   metoprolol tartrate 25 MG tablet Commonly known as: LOPRESSOR TAKE 1 TABLET BY MOUTH TWICE A DAY   pantoprazole 40 MG tablet Commonly known as: PROTONIX TAKE 1 TABLET BY MOUTH TWICE A DAY   promethazine 25 MG tablet Commonly known as: PHENERGAN TAKE 1 TABLET BY MOUTH TWICE A DAY AS NEEDED FOR NAUSEA OR VOMITING   QUEtiapine 50 MG tablet Commonly known as: SEROQUEL TAKE 1 TABLET BY MOUTH 3 TIMES DAILY. MAY TAKE 1 TABLET IN AFTERNOON IF NEEDED FOR SEVERE ANXIETY.   rOPINIRole 0.25 MG tablet Commonly known as: REQUIP TAKE 1 TABLET (0.25 MG TOTAL) BY MOUTH 2 TIMES DAILY AT 12 NOON AND 4 PM.   sucralfate 1 g tablet Commonly known as: CARAFATE TAKE 1 TABLET BY MOUTH FOUR TIMES A DAY AS NEEDED   tiZANidine 2 MG tablet Commonly known as: ZANAFLEX TAKE 1 TABLET BY MOUTH TWICE DAILY AS NEEDED FOR MUSCLE SPASMS        Discharge Exam: Filed Weights   05/10/22 0403  Weight: 78.4 kg   General: Obese lady, no distress apparent Respiratory exam: Bilateral intravesicular Cardiovascular exam S1-S2 normal Abdomen soft nontender Extremities warm without edema Gazes are equal and free, bilateral tympanic membrane healthy although right side was partially obstructed by wax Will equal round No cerebellar signs elicited Bilateral 5/5 strength in all 4 extremities.  Condition at discharge: good  Discharge time spent: greater than 30 minutes.  Signed: Verline Lema, MD [facility] 05/10/2022

## 2022-05-10 NOTE — Evaluation (Signed)
Physical Therapy Evaluation Patient Details Name: [name] MRN: [id number] DOB: December 09, 1945 Today's Date: 05/10/2022  History of Present Illness  presented to ER and admitted under observation for management of dizziness, weakness and pre-syncope episodes.  Clinical Impression  Patient resting in bed upon arrival to room; easily awakened by voice/light touch.  Oriented to self, location and situation; follows commands and agreeable to participation with session.  Bilat UE/LE strength and ROM grossly symmetrical and WFL; no focal weakness appreciated.  Able to complete bed mobility with mod indep; sit/stand, basic transfers and gait (100') without assist device, sup/mod indep.  Demonstrates reciprocal stepping pattern with good step height/length, fair trunk rotation and arm swing. Fair cadence; good balance, stability. Denies presyncopal symptoms  Orthostatic assessment completed; mild drop in SBP with initial transition to stand.  However, good recovery with accommodation to position. Asymptomatic throughout.  Did review strategies to mitigate orthostasis with mobility efforts; patient voiced understanding of information. Appears to be at baseline level of functional ability; no acute PT needs identified at this time. Patient in agreement. Will complete initial order; please re-consult should needs change.       Recommendations for follow up therapy are one component of a multi-disciplinary discharge planning process, led by the attending physician.  Recommendations may be updated based on patient status, additional functional criteria and insurance authorization.  Follow Up Recommendations       Assistance Recommended at Discharge PRN  Patient can return home with the following  A little help with walking and/or transfers;A little help with bathing/dressing/bathroom    Equipment Recommendations    Recommendations for Other Services       Functional Status Assessment        Precautions / Restrictions Precautions Precautions: None Restrictions Weight Bearing Restrictions: No      Mobility  Bed Mobility Overal bed mobility: Modified Independent                  Transfers Overall transfer level: Modified independent Equipment used: None                    Ambulation/Gait Ambulation/Gait assistance: Supervision, Modified independent (Device/Increase time) Gait Distance (Feet): 100 Feet Assistive device: None         General Gait Details: reciprocal stepping pattern with good step height/length, fair trunk rotation and arm swing.  Fair cadence; good balance, stability.  Denies presyncopal symptoms  Stairs            Wheelchair Mobility    Modified Rankin (Stroke Patients Only)       Balance Overall balance assessment: Needs assistance Sitting-balance support: No upper extremity supported, Feet supported Sitting balance-Leahy Scale: Good     Standing balance support: No upper extremity supported Standing balance-Leahy Scale: Good                               Pertinent Vitals/Pain Pain Assessment Pain Assessment: No/denies pain    Home Living Family/patient expects to be discharged to:: Private residence Living Arrangements: Children Available Help at Discharge: Family;Available PRN/intermittently Type of Home: Apartment Home Access: Level entry       Home Layout: One level Home Equipment: Cane - single point;Shower seat;Grab bars - tub/shower      Prior Function Prior Level of Function : Independent/Modified Independent             Mobility Comments: Indep with ADLs, household and limited  community mobilization; does use SPC for longer, community distances (using scooter for longer store trips).  Denies fall history, but does endorse several episodes of "needing to sit" due to generalized weakness/dizziness       Hand Dominance   Dominant Hand: Left    Extremity/Trunk Assessment    Upper Extremity Assessment Upper Extremity Assessment: Overall WFL for tasks assessed    Lower Extremity Assessment Lower Extremity Assessment: Overall WFL for tasks assessed       Communication   Communication: No difficulties  Cognition Arousal/Alertness: Awake/alert Behavior During Therapy: WFL for tasks assessed/performed Overall Cognitive Status: Within Functional Limits for tasks assessed                                          General Comments      Exercises Other Exercises Other Exercises: Orthostatic assessment (see vitals flowsheet for details). Mild drop with initial transition to standing, but recovers with accommodation to position. Asymptomatic. Other Exercises: Did educate re: strategies to mitigate orthostasis; patient voiced understanding.   Assessment/Plan    PT Assessment Patient does not need any further PT services  PT Problem List         PT Treatment Interventions      PT Goals (Current goals can be found in the Care Plan section)  Acute Rehab PT Goals Patient Stated Goal: to return home PT Goal Formulation: All assessment and education complete, DC therapy Time For Goal Achievement: 05/10/22 Potential to Achieve Goals: Good    Frequency       Co-evaluation               AM-PAC PT "6 Clicks" Mobility  Outcome Measure Help needed turning from your back to your side while in a flat bed without using bedrails?: None Help needed moving from lying on your back to sitting on the side of a flat bed without using bedrails?: None Help needed moving to and from a bed to a chair (including a wheelchair)?: None Help needed standing up from a chair using your arms (e.g., wheelchair or bedside chair)?: None Help needed to walk in hospital room?: None Help needed climbing 3-5 steps with a railing? : A Little 6 Click Score: 23    End of Session Equipment Utilized During Treatment: Gait belt Activity Tolerance: Patient  tolerated treatment well Patient left: in bed;with call bell/phone within reach   PT Visit Diagnosis: Difficulty in walking, not elsewhere classified (R26.2)    Time: [id number] PT Time Calculation (min) (ACUTE ONLY): 20 min   Charges:   PT Evaluation $PT Eval Low Complexity: 1 Low        Unita Detamore H. Owens Shark, PT, DPT, NCS 05/10/22, 10:36 AM [phone]

## 2022-05-10 NOTE — Progress Notes (Signed)
Discharged. AVS printed, reviewed. All belonging gathered. Daughter transportation home

## 2022-05-10 NOTE — Progress Notes (Signed)
*  PRELIMINARY RESULTS* Echocardiogram 2D Echocardiogram has been performed.  Elpidio Anis 05/10/2022, 12:56 PM

## 2022-05-11 LAB — THYROID PANEL WITH TSH
Free Thyroxine Index: 1.8 (ref 1.2–4.9)
T3 Uptake Ratio: 27 % (ref 24–39)
T4, Total: 6.7 ug/dL (ref 4.5–12.0)
TSH: 0.979 u[IU]/mL (ref 0.450–4.500)

## 2022-05-11 LAB — HEMOGLOBIN A1C
Hgb A1c MFr Bld: 8.7 % — ABNORMAL HIGH (ref 4.8–5.6)
Mean Plasma Glucose: 203 mg/dL

## 2022-05-14 ENCOUNTER — Other Ambulatory Visit: Payer: Self-pay | Admitting: Nurse Practitioner

## 2022-05-14 DIAGNOSIS — G4709 Other insomnia: Secondary | ICD-10-CM

## 2022-05-14 LAB — CULTURE, BLOOD (ROUTINE X 2)
Culture: NO GROWTH
Culture: NO GROWTH
Special Requests: ADEQUATE

## 2022-06-07 ENCOUNTER — Ambulatory Visit (INDEPENDENT_AMBULATORY_CARE_PROVIDER_SITE_OTHER): Payer: Medicare Other | Admitting: Nurse Practitioner

## 2022-06-07 ENCOUNTER — Encounter: Payer: Self-pay | Admitting: Nurse Practitioner

## 2022-06-07 VITALS — BP 130/80 | HR 67 | Ht 59.0 in | Wt 189.3 lb

## 2022-06-07 DIAGNOSIS — E1159 Type 2 diabetes mellitus with other circulatory complications: Secondary | ICD-10-CM | POA: Diagnosis not present

## 2022-06-07 DIAGNOSIS — K55039 Acute (reversible) ischemia of large intestine, extent unspecified: Secondary | ICD-10-CM | POA: Diagnosis not present

## 2022-06-07 DIAGNOSIS — E1169 Type 2 diabetes mellitus with other specified complication: Secondary | ICD-10-CM

## 2022-06-07 DIAGNOSIS — E785 Hyperlipidemia, unspecified: Secondary | ICD-10-CM | POA: Diagnosis not present

## 2022-06-07 DIAGNOSIS — D509 Iron deficiency anemia, unspecified: Secondary | ICD-10-CM | POA: Diagnosis not present

## 2022-06-07 DIAGNOSIS — Z8711 Personal history of peptic ulcer disease: Secondary | ICD-10-CM

## 2022-06-07 DIAGNOSIS — E1165 Type 2 diabetes mellitus with hyperglycemia: Secondary | ICD-10-CM | POA: Diagnosis not present

## 2022-06-07 DIAGNOSIS — Z7984 Long term (current) use of oral hypoglycemic drugs: Secondary | ICD-10-CM

## 2022-06-07 DIAGNOSIS — I152 Hypertension secondary to endocrine disorders: Secondary | ICD-10-CM | POA: Diagnosis not present

## 2022-06-07 LAB — POCT GLYCOSYLATED HEMOGLOBIN (HGB A1C): HbA1c POC (<> result, manual entry): 8.5 % (ref 4.0–5.6)

## 2022-06-07 MED ORDER — FERROUS FUMARATE 29 MG PO TABS
1.0000 | ORAL_TABLET | Freq: Every day | ORAL | 1 refills | Status: DC
Start: 2022-06-07 — End: 2022-06-14

## 2022-06-07 MED ORDER — TRULICITY 0.75 MG/0.5ML ~~LOC~~ SOAJ
0.7500 mg | SUBCUTANEOUS | 1 refills | Status: DC
Start: 2022-06-07 — End: 2022-07-25

## 2022-06-07 NOTE — Progress Notes (Signed)
Established patient visit   Patient: [name]   DOB: 1945/05/26   77 y.o. Female  MRN: [id number] Visit Date: 06/07/2022   Chief Complaint  Patient presents with   Medical Management of Chronic Issues   Subjective    HPI  Follow up  -DM 2  -HgbA1c  recently elevated at 8.5. -Patient having a great deal of GI issues. -Recently hospitalized for colitis which was causing lightheadedness and hypotension. -- Continues to have cramping and diarrhea around eating.  Also worse first thing in the morning. --Blood work during hospitalization showed leukocytosis and anemia. -HCTZ was stopped -Blood pressure well-managed. -Anxiety and depression well-managed. -Patient sleeping well.    Medications: Outpatient Medications Prior to Visit  Medication Sig   alendronate (FOSAMAX) 70 MG tablet Take 1 tablet (70 mg total) by mouth every 7 (seven) days. Take with a full glass of water on an empty stomach.   aspirin 81 MG tablet Take 1 tablet by mouth daily.   atorvastatin (LIPITOR) 40 MG tablet Take 1 tablet (40 mg total) by mouth daily.   busPIRone (BUSPAR) 10 MG tablet TAKE 1/2 TO 1 TABLET BY MOUTH TWICE DAILY   citalopram (CELEXA) 40 MG tablet TAKE 1 TABLET BY MOUTH EVERY DAY   diclofenac Sodium (VOLTAREN) 1 % GEL Apply 4 g topically 4 (four) times daily.   diltiazem (CARDIZEM CD) 240 MG 24 hr capsule TAKE 1 CAPSULE(240 MG) BY MOUTH DAILY   levothyroxine (SYNTHROID) 50 MCG tablet TAKE 1 TABLET BY MOUTH EVERY DAY IN THE MORNING   lisinopril (ZESTRIL) 20 MG tablet TAKE 1 TABLET BY MOUTH EVERY DAY   loperamide (IMODIUM) 2 MG capsule Take 2 capsules (4 mg total) by mouth as needed for diarrhea or loose stools.   metFORMIN (GLUCOPHAGE) 850 MG tablet TAKE 1 TABLET BY MOUTH TWICE A DAY WITH FOOD   metoprolol tartrate (LOPRESSOR) 25 MG tablet TAKE 1 TABLET BY MOUTH TWICE A DAY   pantoprazole (PROTONIX) 40 MG tablet TAKE 1 TABLET BY MOUTH TWICE A DAY   promethazine (PHENERGAN) 25 MG tablet  TAKE 1 TABLET BY MOUTH TWICE A DAY AS NEEDED FOR NAUSEA OR VOMITING   sucralfate (CARAFATE) 1 g tablet TAKE 1 TABLET BY MOUTH FOUR TIMES A DAY AS NEEDED   zolpidem (AMBIEN) 10 MG tablet TAKE 1 TABLET BY MOUTH AT BEDTIME AS NEEDED FOR SLEEP.   [DISCONTINUED] QUEtiapine (SEROQUEL) 50 MG tablet TAKE 1 TABLET BY MOUTH 3 TIMES DAILY. MAY TAKE 1 TABLET IN AFTERNOON IF NEEDED FOR SEVERE ANXIETY.   [DISCONTINUED] meloxicam (MOBIC) 15 MG tablet Take 15 mg by mouth daily.   [DISCONTINUED] rOPINIRole (REQUIP) 0.25 MG tablet TAKE 1 TABLET (0.25 MG TOTAL) BY MOUTH 2 TIMES DAILY AT 12 NOON AND 4 PM.   [DISCONTINUED] tiZANidine (ZANAFLEX) 2 MG tablet TAKE 1 TABLET BY MOUTH TWICE DAILY AS NEEDED FOR MUSCLE SPASMS   No facility-administered medications prior to visit.    Review of Systems  Last CBC Lab Results  Component Value Date   WBC 11.9 (H) 06/15/2022   HGB 9.8 (L) 06/15/2022   HCT 33.0 (L) 06/15/2022   MCV 73 (L) 06/15/2022   MCH 21.7 (L) 06/15/2022   RDW 16.2 (H) 06/15/2022   PLT 268 05/10/2022   Last metabolic panel Lab Results  Component Value Date   GLUCOSE 168 (H) 05/10/2022   NA 138 05/10/2022   K 3.8 05/10/2022   CL 101 05/10/2022   CO2 28 05/10/2022   BUN 8 05/10/2022  CREATININE 0.61 05/10/2022   GFRNONAA >60 05/10/2022   CALCIUM 9.1 05/10/2022   PROT 6.6 10/18/2021   ALBUMIN 4.2 10/18/2021   LABGLOB 2.2 04/27/2021   AGRATIO 2.0 04/27/2021   BILITOT 0.7 10/18/2021   ALKPHOS 52 10/18/2021   AST 44 (H) 10/18/2021   ALT 35 10/18/2021   ANIONGAP 9 05/10/2022   Last lipids Lab Results  Component Value Date   CHOL 111 05/10/2022   HDL 35 (L) 05/10/2022   LDLCALC 55 05/10/2022   TRIG 106 05/10/2022   CHOLHDL 3.2 05/10/2022   Last hemoglobin A1c Lab Results  Component Value Date   HGBA1C 8.5 06/07/2022   Last thyroid functions Lab Results  Component Value Date   TSH 0.979 05/10/2022   T4TOTAL 6.7 05/10/2022   Last vitamin D Lab Results  Component Value Date    VD25OH 48.1 12/27/2019   Last vitamin B12 and Folate Lab Results  Component Value Date   VITAMINB12 576 06/15/2022   FOLATE >20.0 12/27/2019       Objective     Today's Vitals   06/07/22 1513  BP: 130/80  Pulse: 67  SpO2: 95%  Weight: 189 lb 4.8 oz (85.9 kg)  Height: 4\' 11"  (1.499 m)   Body mass index is 38.23 kg/m.  BP Readings from Last 3 Encounters:  06/15/22 (Abnormal) 162/85  06/07/22 130/80  05/10/22 (Abnormal) 160/73    Wt Readings from Last 3 Encounters:  06/15/22 179 lb 6.4 oz (81.4 kg)  06/07/22 189 lb 4.8 oz (85.9 kg)  05/10/22 172 lb 13.5 oz (78.4 kg)    Physical Exam Vitals and nursing note reviewed.  Constitutional:      Appearance: Normal appearance. She is well-developed.  HENT:     Head: Normocephalic and atraumatic.     Nose: Nose normal.     Mouth/Throat:     Mouth: Mucous membranes are moist.     Pharynx: Oropharynx is clear.  Eyes:     Extraocular Movements: Extraocular movements intact.     Conjunctiva/sclera: Conjunctivae normal.     Pupils: Pupils are equal, round, and reactive to light.  Neck:     Vascular: No carotid bruit.  Cardiovascular:     Rate and Rhythm: Normal rate and regular rhythm.     Pulses: Normal pulses.     Heart sounds: Normal heart sounds.  Pulmonary:     Effort: Pulmonary effort is normal.     Breath sounds: Normal breath sounds.  Abdominal:     General: Bowel sounds are normal. There is no distension.     Palpations: Abdomen is soft. There is no mass.     Tenderness: There is no abdominal tenderness. There is no right CVA tenderness, left CVA tenderness, guarding or rebound.     Hernia: No hernia is present.  Musculoskeletal:        General: Normal range of motion.     Cervical back: Normal range of motion and neck supple.  Lymphadenopathy:     Cervical: No cervical adenopathy.  Skin:    General: Skin is warm and dry.     Capillary Refill: Capillary refill takes less than 2 seconds.  Neurological:      General: No focal deficit present.     Mental Status: She is alert and oriented to person, place, and time. Mental status is at baseline.  Psychiatric:        Mood and Affect: Mood normal.        Behavior: Behavior normal.  Thought Content: Thought content normal.        Judgment: Judgment normal.      Results for orders placed or performed in visit on 06/07/22  POCT glycosylated hemoglobin (Hb A1C)  Result Value Ref Range   Hemoglobin A1C     HbA1c POC (<> result, manual entry) 8.5 4.0 - 5.6 %   HbA1c, POC (prediabetic range)     HbA1c, POC (controlled diabetic range)      Assessment & Plan    Type 2 diabetes mellitus with hyperglycemia, without long-term current use of insulin (HCC) Assessment & Plan: Hemoglobin A1c elevated at 8.5. Add Trulicity 0.75 mg weekly. Continue other diabetic medications prescribed. Limit carbohydrates and sugar in the diet. Recheck hemoglobin A1c in 3 months.  Orders: -     POCT glycosylated hemoglobin (Hb A1C) -     Trulicity; Inject 0.75 mg into the skin once a week.  Dispense: 2 mL; Refill: 1  Acute ischemic colitis (HCC) Assessment & Plan: Recent hospitalization due to symptoms associated with acute colitis. Continues to have some cramping and diarrhea.  Worse after eating and first thing in the morning. Refer to GI provider for further evaluation and treatment.  Orders: -     Ambulatory referral to Gastroenterology  H/O gastric ulcer Assessment & Plan: Concern recurrence of gastric ulcer due to recent hospitalization for abdominal pain. Mild/moderate anemia during hospitalization. Refer to GI for further evaluation.  Orders: -     Ambulatory referral to Gastroenterology  Iron deficiency anemia, unspecified iron deficiency anemia type Assessment & Plan: Start iron supplement -refer to GI for further evaluation  Orders: -     Ambulatory referral to Gastroenterology  Hypertension associated with type 2 diabetes  mellitus (HCC) Assessment & Plan: Blood pressure stable. -Continue current medication.    Hyperlipidemia associated with type 2 diabetes mellitus (HCC) Assessment & Plan: Stable.  Continue current medication.       Return in about 3 months (around 09/06/2022) for diabetes with HgbA1c check -check CBC at time of visit .         Carlean Jews, NP  [facility] [phone] (phone) [phone] (fax)  [facility]

## 2022-06-07 NOTE — Patient Instructions (Signed)
Cut metoprolol to 1/2 tablet twice daily  Add trulicity 0.75 mg weekly  Add iron supplement daily  Refer to GI for evaluation of anemia and possible gastric ulcer.

## 2022-06-08 ENCOUNTER — Ambulatory Visit: Payer: TRICARE For Life (TFL) | Admitting: Nurse Practitioner

## 2022-06-11 ENCOUNTER — Other Ambulatory Visit: Payer: Self-pay | Admitting: Nurse Practitioner

## 2022-06-11 DIAGNOSIS — F322 Major depressive disorder, single episode, severe without psychotic features: Secondary | ICD-10-CM

## 2022-06-14 ENCOUNTER — Telehealth: Payer: Self-pay | Admitting: *Deleted

## 2022-06-14 DIAGNOSIS — G4720 Circadian rhythm sleep disorder, unspecified type: Secondary | ICD-10-CM | POA: Insufficient documentation

## 2022-06-14 DIAGNOSIS — D126 Benign neoplasm of colon, unspecified: Secondary | ICD-10-CM | POA: Insufficient documentation

## 2022-06-14 DIAGNOSIS — D509 Iron deficiency anemia, unspecified: Secondary | ICD-10-CM

## 2022-06-14 DIAGNOSIS — E8881 Metabolic syndrome: Secondary | ICD-10-CM | POA: Insufficient documentation

## 2022-06-14 DIAGNOSIS — J189 Pneumonia, unspecified organism: Secondary | ICD-10-CM | POA: Insufficient documentation

## 2022-06-14 MED ORDER — IRON (FERROUS SULFATE) 325 (65 FE) MG PO TABS
325.0000 mg | ORAL_TABLET | Freq: Every day | ORAL | 3 refills | Status: DC
Start: 2022-06-14 — End: 2022-09-08

## 2022-06-14 NOTE — Telephone Encounter (Signed)
Pt informed

## 2022-06-14 NOTE — Telephone Encounter (Signed)
Pt daughter calling to say that the pharmacy does not have the ferrous fumarate 29 mg tablets and that they would like a new Rx sent in for something else. Routing to provider in office to send something equivalent to the original.

## 2022-06-14 NOTE — Telephone Encounter (Signed)
Sent in for ferrous sulfate 325 mg tablet daily as alternative since they did not have the ferrous fumarate.

## 2022-06-15 ENCOUNTER — Other Ambulatory Visit: Payer: Self-pay

## 2022-06-15 ENCOUNTER — Ambulatory Visit (INDEPENDENT_AMBULATORY_CARE_PROVIDER_SITE_OTHER): Payer: Medicare Other | Admitting: Physician Assistant

## 2022-06-15 ENCOUNTER — Encounter: Payer: Self-pay | Admitting: Physician Assistant

## 2022-06-15 VITALS — BP 162/85 | HR 108 | Temp 97.9°F | Ht 60.0 in | Wt 179.4 lb

## 2022-06-15 DIAGNOSIS — D509 Iron deficiency anemia, unspecified: Secondary | ICD-10-CM

## 2022-06-15 DIAGNOSIS — R1013 Epigastric pain: Secondary | ICD-10-CM | POA: Diagnosis not present

## 2022-06-15 DIAGNOSIS — D649 Anemia, unspecified: Secondary | ICD-10-CM

## 2022-06-15 NOTE — Progress Notes (Signed)
Celso Amy, PA-C [address]  [address]  [place], [place] [address]  Main: [phone]  Fax: [phone]   Gastroenterology Consultation  Referring Provider:     Carlean Jews, NP Primary Care Physician:  Carlean Jews, NP Primary Gastroenterologist:  Dr. Wyline Mood  Reason for Consultation:     Follow-up iron deficiency anemia and ischemic colitis        HPI:   [name] is a 77 y.o. y/o female referred for consultation & management  by Carlean Jews, NP.    Pt. Was Hospitalized 05/09/22 until 05/10/2022 with presyncope, orthostatic hypotension, leukocytosis, type 2 diabetes with hyperglycemia, bradycardia, and gait instability.  Hemoglobin was 8.9g.  She did not have any GI consult or procedures during hospitalization.  She followed up with her PCP 06/07/2022 and was thought to have acute ischemic colitis.  Referred to GI.  She has not had any repeat lab work.  States she had an episode of diarrhea 3 weeks ago which has resolved.  She has not had any rectal bleeding.  Has intermittent episodes of diarrhea for many years.  She denies melena.  She is having some epigastric pain and mild nausea.  Denies vomiting or hematemesis.  She is worried about a stomach ulcer.  She is here today with her daughter Alvis Lemmings).  Her PCP prescribed iron ferrous sulfate 325 mg once daily and she has not yet started it.  She has been taking a fiber supplement which has helped her bowel movements.  Recently started Trulicity.  Most recent lab 05/10/2022 showed hemoglobin 8.9, hematocrit 29, MCV 73, platelets 269.  Lab 10/2021 showed hemoglobin 9.9.  Labs 01/2021 showed hemoglobin 12.6 (baseline).  She takes 81 mg aspirin daily.  No other blood thinners.  Denies NSAID use.  Takes Tylenol prn.  Currently takes pantoprazole 40 Mg twice daily and sucralfate 1 g twice daily.  Last abdominal pelvic CT with contrast 10/2021 showed descending colon colitis (similar to 2018).  Large hiatal hernia.   Diverticulosis with no diverticulitis.  Last colonoscopy 04/2019 by Dr. Tobi Bastos showed 1 small adenomatous and 1 small benign polyp removed, excellent prep, sigmoid diverticulosis.  Past Medical History:  Diagnosis Date   Anxiety    Depression    Diabetes mellitus, type II (HCC)    GERD (gastroesophageal reflux disease)    Hypertension    Thyroid disease     Past Surgical History:  Procedure Laterality Date   ABDOMINAL HYSTERECTOMY     BREAST EXCISIONAL BIOPSY Bilateral 80s   benign   CATARACT EXTRACTION W/PHACO Right 10/14/2020   Procedure: CATARACT EXTRACTION PHACO AND INTRAOCULAR LENS PLACEMENT (IOC)RIGHT  DIABETIC 5.54 00:54.6;  Surgeon: Lockie Mola, MD;  Location: [facility];  Service: Ophthalmology;  Laterality: Right;   CATARACT EXTRACTION W/PHACO Left 10/28/2020   Procedure: CATARACT EXTRACTION PHACO AND INTRAOCULAR LENS PLACEMENT (IOC) LEFT DIABETIC;  Surgeon: Lockie Mola, MD;  Location: [facility];  Service: Ophthalmology;  Laterality: Left;  8.13 01:09.5   COLONOSCOPY WITH PROPOFOL N/A 04/12/2019   Procedure: COLONOSCOPY WITH PROPOFOL;  Surgeon: Wyline Mood, MD;  Location: [facility] ENDOSCOPY;  Service: Gastroenterology;  Laterality: N/A;   JOINT REPLACEMENT Right 2019    Prior to Admission medications   Medication Sig Start Date End Date Taking? Authorizing Provider  alendronate (FOSAMAX) 70 MG tablet Take 1 tablet (70 mg total) by mouth every 7 (seven) days. Take with a full glass of water on an empty stomach. 08/04/21  Carlean Jews, NP  aspirin 81 MG tablet Take 1 tablet by mouth daily.    [provider]  atorvastatin (LIPITOR) 40 MG tablet Take 1 tablet (40 mg total) by mouth daily. 05/11/22   Loyce Dys, MD  busPIRone (BUSPAR) 10 MG tablet TAKE 1/2 TO 1 TABLET BY MOUTH TWICE DAILY 01/28/22   Carlean Jews, NP  citalopram (CELEXA) 40 MG tablet TAKE 1 TABLET BY MOUTH EVERY DAY 04/04/22   Carlean Jews, NP  diclofenac  Sodium (VOLTAREN) 1 % GEL Apply 4 g topically 4 (four) times daily. 05/25/20   Carlean Jews, NP  diltiazem (CARDIZEM CD) 240 MG 24 hr capsule TAKE 1 CAPSULE(240 MG) BY MOUTH DAILY 04/04/22   Carlean Jews, NP  Dulaglutide (TRULICITY) 0.75 MG/0.5ML SOPN Inject 0.75 mg into the skin once a week. 06/07/22   Carlean Jews, NP  Iron, Ferrous Sulfate, 325 (65 Fe) MG TABS Take 325 mg by mouth daily. 06/14/22   Melida Quitter, PA  levothyroxine (SYNTHROID) 50 MCG tablet TAKE 1 TABLET BY MOUTH EVERY DAY IN THE MORNING 01/28/22   Carlean Jews, NP  lisinopril (ZESTRIL) 20 MG tablet TAKE 1 TABLET BY MOUTH EVERY DAY 05/02/22   Carlean Jews, NP  loperamide (IMODIUM) 2 MG capsule Take 2 capsules (4 mg total) by mouth as needed for diarrhea or loose stools. 03/09/21   Carlean Jews, NP  metFORMIN (GLUCOPHAGE) 850 MG tablet TAKE 1 TABLET BY MOUTH TWICE A DAY WITH FOOD 05/02/22   Carlean Jews, NP  metoprolol tartrate (LOPRESSOR) 25 MG tablet TAKE 1 TABLET BY MOUTH TWICE A DAY 03/14/22   Carlean Jews, NP  pantoprazole (PROTONIX) 40 MG tablet TAKE 1 TABLET BY MOUTH TWICE A DAY 05/02/22   Boscia, Heather E, NP  promethazine (PHENERGAN) 25 MG tablet TAKE 1 TABLET BY MOUTH TWICE A DAY AS NEEDED FOR NAUSEA OR VOMITING 01/10/22   Boscia, Kathlynn Grate, NP  QUEtiapine (SEROQUEL) 50 MG tablet TAKE 1 TABLET BY MOUTH 3 TIMES DAILY. MAY TAKE 1 TABLET IN AFTERNOON IF NEEDED FOR SEVERE ANXIETY. 06/13/22   Carlean Jews, NP  sucralfate (CARAFATE) 1 g tablet TAKE 1 TABLET BY MOUTH FOUR TIMES A DAY AS NEEDED 11/29/21   Carlean Jews, NP  zolpidem (AMBIEN) 10 MG tablet TAKE 1 TABLET BY MOUTH AT BEDTIME AS NEEDED FOR SLEEP. 05/16/22   Carlean Jews, NP    Family History  Problem Relation Age of Onset   Anxiety disorder Mother    Colon cancer Mother    Heart disease Father    Anxiety disorder Father    Obesity Sister    Anxiety disorder Sister    Depression Sister    Osteoporosis Sister    Breast  cancer Paternal Aunt      Social History   Tobacco Use   Smoking status: Never   Smokeless tobacco: Never  Vaping Use   Vaping Use: Never used  Substance Use Topics   Alcohol use: No    Alcohol/week: 0.0 standard drinks of alcohol   Drug use: Never    Allergies as of 06/15/2022   (No Known Allergies)    Review of Systems:    All systems reviewed and negative except where noted in HPI.   Physical Exam:  BP (!) 162/85   Pulse (!) 108   Temp 97.9 F (36.6 C)   Ht 5' (1.524 m)   Wt 179 lb 6.4 oz (81.4  kg)   BMI 35.04 kg/m  No LMP recorded. Patient has had a hysterectomy. Psych:  Alert and cooperative. Normal mood and affect. General:   Alert,  Well-developed, well-nourished, pleasant and cooperative in NAD Head:  Normocephalic and atraumatic. Eyes:  Sclera clear, no icterus.   Conjunctiva pink.  Lungs:  Respirations even and unlabored.  Clear throughout to auscultation.   No wheezes, crackles, or rhonchi. No acute distress. Heart:  Regular rate and rhythm; no murmurs, clicks, rubs, or gallops. Abdomen:  Normal bowel sounds.  No bruits.  Soft, obese, without masses, hepatosplenomegaly or hernias noted.  Moderate epigastric tenderness, rest of abdomen is not tender.  No guarding or rebound tenderness.    Neurologic:  Alert and oriented x3;  grossly normal neurologically. Psych:  Alert and cooperative. Normal mood and affect.  Imaging Studies: No results found.  Assessment and Plan:   DEVOIRY REISCH is a 77 y.o. y/o female has been referred for iron deficiency anemia and epigastric pain.  Remote history of ischemic colitis.  Last episode of diarrhea was 3 weeks ago.  Iron Deficiency Anemia Labs: CBC, iron panel, ferritin, vitamin B12. She is going to start ferrous sulfate 325 mg 1 tablet once daily with vitamin C today, recently prescribed from her PCP. May need to hold aspirin if hemoglobin is lower.  No other blood thinners.  Scheduling EGD to evaluate for peptic  ulcer or gastritis.  I have discussed risks & benefits of the procedure  which include, but are not limited to, bleeding, infection, perforation,respiratory compromise & drug reaction.  The patient agrees with this plan & written consent will be obtained.     Epigastric Pain  Scheduling EGD to evaluate for peptic ulcer or gastritis.  Continue pantoprazole 40 Mg twice daily and sucralfate 1 g twice daily.  Avoid NSAIDs.  3.   Large hiatal hernia seen on abdominal CT in 2023.  4.   History of Ischemic Colitis  Appears to be resolved at present.  No current diarrhea.  No episodes of rectal bleeding.  5.  History of colon polyps  Last colonoscopy 04/2019 showed 1 small adenomatous polyp removed.   5 year repeat will be due 04/2024.  Follow up in 4 - 6 weeks after EGD with DR. Anna.  Celso Amy, PA-C

## 2022-06-16 LAB — VITAMIN B12: Vitamin B-12: 576 pg/mL (ref 232–1245)

## 2022-06-16 LAB — IRON,TIBC AND FERRITIN PANEL
Ferritin: 9 ng/mL — ABNORMAL LOW (ref 15–150)
Iron Saturation: 4 % — CL (ref 15–55)
Iron: 17 ug/dL — ABNORMAL LOW (ref 27–139)
Total Iron Binding Capacity: 457 ug/dL — ABNORMAL HIGH (ref 250–450)
UIBC: 440 ug/dL — ABNORMAL HIGH (ref 118–369)

## 2022-06-16 LAB — CBC WITH DIFFERENTIAL
Basophils Absolute: 0.1 10*3/uL (ref 0.0–0.2)
Basos: 1 %
EOS (ABSOLUTE): 0.1 10*3/uL (ref 0.0–0.4)
Eos: 1 %
Hematocrit: 33 % — ABNORMAL LOW (ref 34.0–46.6)
Hemoglobin: 9.8 g/dL — ABNORMAL LOW (ref 11.1–15.9)
Immature Grans (Abs): 0 10*3/uL (ref 0.0–0.1)
Immature Granulocytes: 0 %
Lymphocytes Absolute: 4.6 10*3/uL — ABNORMAL HIGH (ref 0.7–3.1)
Lymphs: 38 %
MCH: 21.7 pg — ABNORMAL LOW (ref 26.6–33.0)
MCHC: 29.7 g/dL — ABNORMAL LOW (ref 31.5–35.7)
MCV: 73 fL — ABNORMAL LOW (ref 79–97)
Monocytes Absolute: 0.7 10*3/uL (ref 0.1–0.9)
Monocytes: 6 %
Neutrophils Absolute: 6.5 10*3/uL (ref 1.4–7.0)
Neutrophils: 54 %
RBC: 4.52 x10E6/uL (ref 3.77–5.28)
RDW: 16.2 % — ABNORMAL HIGH (ref 11.7–15.4)
WBC: 11.9 10*3/uL — ABNORMAL HIGH (ref 3.4–10.8)

## 2022-06-17 ENCOUNTER — Telehealth: Payer: Self-pay

## 2022-06-17 NOTE — Telephone Encounter (Signed)
Per Tina---Hemoglobin has improved from 8.9 to 9.8 in the past month, yet still a little low.  Goal Hgb is greater than 12g.  Iron is very low.  Vitamin B12 is normal.  Continue with plan to start iron tablet once daily with vitamin C.  Also continue with plan for EGD as scheduled.   Patient notified of results-spoke with Alvis Lemmings (daughter).

## 2022-06-21 ENCOUNTER — Telehealth: Payer: Self-pay

## 2022-06-21 ENCOUNTER — Other Ambulatory Visit: Payer: Self-pay

## 2022-06-21 MED ORDER — PEG 3350-KCL-NA BICARB-NACL 420 G PO SOLR: 4000.0000 mL | Freq: Once | ORAL | 0 refills | Status: AC

## 2022-06-21 NOTE — Telephone Encounter (Signed)
Per Dr.Anna add Colonoscopy 07/14/22-patient currently scheduled for EGD 07/14/22-spoke with Trish @ Endo unit and Colonoscopy is added. IDA diagnosis-notified patients daughter and also let her know to hold Trulicity 7 days prior to procedure and hold metformin 2 days prior-goltely sent to pharmacy and bowel prep instructions mailed to daughter per request-   Bowel prep instructions mailed to patient and noted stating to Hold trulicity 7 days prior to procedure as well as Metformin to hold 2 days prior to procedure.   Patients daughter verbalized understanding,

## 2022-06-23 ENCOUNTER — Ambulatory Visit: Payer: TRICARE For Life (TFL) | Admitting: Physician Assistant

## 2022-06-26 DIAGNOSIS — Z8711 Personal history of peptic ulcer disease: Secondary | ICD-10-CM | POA: Insufficient documentation

## 2022-06-26 DIAGNOSIS — E1159 Type 2 diabetes mellitus with other circulatory complications: Secondary | ICD-10-CM | POA: Insufficient documentation

## 2022-06-26 DIAGNOSIS — D509 Iron deficiency anemia, unspecified: Secondary | ICD-10-CM | POA: Insufficient documentation

## 2022-06-26 NOTE — Assessment & Plan Note (Signed)
Concern recurrence of gastric ulcer due to recent hospitalization for abdominal pain. Mild/moderate anemia during hospitalization. Refer to GI for further evaluation.

## 2022-06-26 NOTE — Assessment & Plan Note (Signed)
Hemoglobin A1c elevated at 8.5. Add Trulicity 0.75 mg weekly. Continue other diabetic medications prescribed. Limit carbohydrates and sugar in the diet. Recheck hemoglobin A1c in 3 months.

## 2022-06-26 NOTE — Assessment & Plan Note (Signed)
Recent hospitalization due to symptoms associated with acute colitis. Continues to have some cramping and diarrhea.  Worse after eating and first thing in the morning. Refer to GI provider for further evaluation and treatment.

## 2022-06-26 NOTE — Assessment & Plan Note (Signed)
Blood pressure stable. Continue current medication.  

## 2022-06-26 NOTE — Assessment & Plan Note (Signed)
Stable.  Continue current medication

## 2022-06-26 NOTE — Assessment & Plan Note (Signed)
Start iron supplement -refer to GI for further evaluation

## 2022-06-29 ENCOUNTER — Other Ambulatory Visit: Payer: Self-pay | Admitting: Nurse Practitioner

## 2022-06-29 DIAGNOSIS — I10 Essential (primary) hypertension: Secondary | ICD-10-CM

## 2022-06-29 DIAGNOSIS — F322 Major depressive disorder, single episode, severe without psychotic features: Secondary | ICD-10-CM

## 2022-07-01 ENCOUNTER — Other Ambulatory Visit: Payer: Self-pay | Admitting: Nurse Practitioner

## 2022-07-01 DIAGNOSIS — E039 Hypothyroidism, unspecified: Secondary | ICD-10-CM

## 2022-07-01 DIAGNOSIS — K219 Gastro-esophageal reflux disease without esophagitis: Secondary | ICD-10-CM

## 2022-07-01 DIAGNOSIS — E1165 Type 2 diabetes mellitus with hyperglycemia: Secondary | ICD-10-CM

## 2022-07-01 DIAGNOSIS — I10 Essential (primary) hypertension: Secondary | ICD-10-CM

## 2022-07-07 ENCOUNTER — Encounter: Payer: Self-pay | Admitting: Gastroenterology

## 2022-07-14 ENCOUNTER — Ambulatory Visit: Payer: Medicare Other | Admitting: Anesthesiology

## 2022-07-14 ENCOUNTER — Encounter: Payer: Self-pay | Admitting: Gastroenterology

## 2022-07-14 ENCOUNTER — Encounter
Admission: RE | Disposition: A | Discharge status: Home / Self Care | Payer: Self-pay | Source: Home / Self Care | Attending: Gastroenterology

## 2022-07-14 ENCOUNTER — Ambulatory Visit
Admission: RE | Admit: 2022-07-14 | Discharge: 2022-07-14 | Disposition: A | Discharge status: Home / Self Care | Payer: Medicare Other | Attending: Gastroenterology | Admitting: Gastroenterology

## 2022-07-14 DIAGNOSIS — R1013 Epigastric pain: Secondary | ICD-10-CM

## 2022-07-14 DIAGNOSIS — K635 Polyp of colon: Secondary | ICD-10-CM | POA: Diagnosis not present

## 2022-07-14 DIAGNOSIS — K649 Unspecified hemorrhoids: Secondary | ICD-10-CM | POA: Diagnosis not present

## 2022-07-14 DIAGNOSIS — D649 Anemia, unspecified: Secondary | ICD-10-CM | POA: Diagnosis not present

## 2022-07-14 DIAGNOSIS — Z800 Family history of malignant neoplasm of digestive organs: Secondary | ICD-10-CM | POA: Insufficient documentation

## 2022-07-14 DIAGNOSIS — K449 Diaphragmatic hernia without obstruction or gangrene: Secondary | ICD-10-CM | POA: Insufficient documentation

## 2022-07-14 DIAGNOSIS — D509 Iron deficiency anemia, unspecified: Secondary | ICD-10-CM | POA: Insufficient documentation

## 2022-07-14 DIAGNOSIS — K5790 Diverticulosis of intestine, part unspecified, without perforation or abscess without bleeding: Secondary | ICD-10-CM | POA: Diagnosis not present

## 2022-07-14 DIAGNOSIS — K5730 Diverticulosis of large intestine without perforation or abscess without bleeding: Secondary | ICD-10-CM | POA: Diagnosis not present

## 2022-07-14 HISTORY — PX: ESOPHAGOGASTRODUODENOSCOPY (EGD) WITH PROPOFOL: SHX5813

## 2022-07-14 HISTORY — PX: COLONOSCOPY: SHX5424

## 2022-07-14 HISTORY — DX: Hyperlipidemia, unspecified: E78.5

## 2022-07-14 LAB — GLUCOSE, CAPILLARY: Glucose-Capillary: 188 mg/dL — ABNORMAL HIGH (ref 70–99)

## 2022-07-14 SURGERY — ESOPHAGOGASTRODUODENOSCOPY (EGD) WITH PROPOFOL
Anesthesia: General

## 2022-07-14 MED ORDER — PROPOFOL 10 MG/ML IV BOLUS
INTRAVENOUS | Status: DC | PRN
  Administered 2022-07-14: 150 ug/kg/min via INTRAVENOUS
  Administered 2022-07-14: 50 mg via INTRAVENOUS

## 2022-07-14 MED ORDER — SODIUM CHLORIDE 0.9 % IV SOLN
INTRAVENOUS | Status: DC
  Administered 2022-07-14: 1000 mL via INTRAVENOUS

## 2022-07-14 MED ORDER — LIDOCAINE HCL (CARDIAC) PF 100 MG/5ML IV SOSY
PREFILLED_SYRINGE | INTRAVENOUS | Status: DC | PRN
  Administered 2022-07-14: 100 mg via INTRAVENOUS

## 2022-07-14 MED ORDER — LIDOCAINE HCL (PF) 2 % IJ SOLN
INTRAMUSCULAR | Status: AC
  Filled 2022-07-14: qty 30

## 2022-07-14 MED ORDER — PROPOFOL 1000 MG/100ML IV EMUL
INTRAVENOUS | Status: AC
  Filled 2022-07-14: qty 100

## 2022-07-14 NOTE — Anesthesia Postprocedure Evaluation (Signed)
Anesthesia Post Note  Patient: [name]  Procedure(s) Performed: ESOPHAGOGASTRODUODENOSCOPY (EGD) WITH PROPOFOL COLONOSCOPY  Patient location during evaluation: Endoscopy Anesthesia Type: General Level of consciousness: awake and alert Pain management: pain level controlled Vital Signs Assessment: post-procedure vital signs reviewed and stable Respiratory status: spontaneous breathing, nonlabored ventilation, respiratory function stable and patient connected to nasal cannula oxygen Cardiovascular status: blood pressure returned to baseline and stable Postop Assessment: no apparent nausea or vomiting Anesthetic complications: no   There were no known notable events for this encounter.   Last Vitals:  Vitals:   07/14/22 0819 07/14/22 0829  BP: (!) 158/61 (!) 172/77  Pulse: 71 75  Resp: 15 15  Temp: 36.4 C   SpO2: 97% 95%    Last Pain:  Vitals:   07/14/22 0819  TempSrc: Temporal  PainSc:                  Corinda Gubler

## 2022-07-14 NOTE — Transfer of Care (Signed)
Immediate Anesthesia Transfer of Care Note  Patient: [name]  Procedure(s) Performed: ESOPHAGOGASTRODUODENOSCOPY (EGD) WITH PROPOFOL COLONOSCOPY  Patient Location: PACU  Anesthesia Type:General  Level of Consciousness: awake, alert , and oriented  Airway & Oxygen Therapy: Patient Spontanous Breathing and Patient connected to face mask oxygen  Post-op Assessment: Report given to RN and Post -op Vital signs reviewed and stable  Post vital signs: stable  Last Vitals:  Vitals Value Taken Time  BP 158/61 07/14/22 0819  Temp    Pulse 70 07/14/22 0819  Resp 18 07/14/22 0819  SpO2 96 % 07/14/22 0819  Vitals shown include unvalidated device data.  Last Pain:  Vitals:   07/14/22 0714  TempSrc: Temporal  PainSc: 0-No pain         Complications: No notable events documented.

## 2022-07-14 NOTE — Op Note (Signed)
[facility] Gastroenterology Patient Name: [name] Procedure Date: 07/14/2022 7:13 AM MRN: [id number] Account #: [id number] Date of Birth: Oct 23, 1945 Admit Type: Outpatient Age: 77 Room: [facility] ENDO ROOM 4 Gender: Female Note Status: Finalized Instrument Name: Prentice Docker [id number] Procedure:             Colonoscopy Indications:           Iron deficiency anemia Providers:             Wyline Mood MD, MD Referring MD:          No Local Md, MD (Referring MD) Medicines:             Monitored Anesthesia Care Complications:         No immediate complications. Procedure:             Pre-Anesthesia Assessment:                        - Prior to the procedure, a History and Physical was                         performed, and patient medications, allergies and                         sensitivities were reviewed. The patient's tolerance                         of previous anesthesia was reviewed.                        - The risks and benefits of the procedure and the                         sedation options and risks were discussed with the                         patient. All questions were answered and informed                         consent was obtained.                        - ASA Grade Assessment: II - A patient with mild                         systemic disease.                        After obtaining informed consent, the colonoscope was                         passed under direct vision. Throughout the procedure,                         the patient's blood pressure, pulse, and oxygen                         saturations were monitored continuously. The                         Colonoscope was introduced through  the anus and                         advanced to the the cecum, identified by the                         appendiceal orifice. The colonoscopy was performed                         with ease. The patient tolerated the procedure well.                          The quality of the bowel preparation was excellent.                         The ileocecal valve, appendiceal orifice, and rectum                         were photographed. Findings:      Multiple medium-mouthed diverticula were found in the entire colon.      The exam was otherwise without abnormality on direct and retroflexion       views.      A 5 mm polyp was found in the transverse colon. The polyp was sessile.       The polyp was removed with a cold snare. Resection and retrieval were       complete. Impression:            - Diverticulosis in the entire examined colon.                        - The examination was otherwise normal on direct and                         retroflexion views. Recommendation:        - Discharge patient to home (with escort).                        - Resume previous diet.                        - Continue present medications.                        - Repeat colonoscopy is not recommended due to current                         age (66 years or older) for screening purposes.                        - Return to GI office as previously scheduled.                        - Await pathology results. Procedure Code(s):     --- Professional ---                        [phone], Colonoscopy, flexible; diagnostic, including                         collection of specimen(s)  by brushing or washing, when                         performed (separate procedure) Diagnosis Code(s):     --- Professional ---                        D50.9, Iron deficiency anemia, unspecified                        K57.30, Diverticulosis of large intestine without                         perforation or abscess without bleeding CPT copyright 2022 American Medical Association. All rights reserved. The codes documented in this report are preliminary and upon coder review may  be revised to meet current compliance requirements. Wyline Mood, MD Wyline Mood MD, MD 07/14/2022 8:18:56 AM This report has been  signed electronically. Number of Addenda: 0 Note Initiated On: 07/14/2022 7:13 AM Scope Withdrawal Time: 0 hours 10 minutes 23 seconds  Estimated Blood Loss:  Estimated blood loss: none.      [facility]

## 2022-07-14 NOTE — H&P (Signed)
Wyline Mood, MD [address], [place], [place], [address] [address], [address], [place], [place], [address] Phone: [phone]  Fax: [phone]  Primary Care Physician:  Carlean Jews, NP   Pre-Procedure History & Physical: HPI:  [name] is a 77 y.o. female is here for an endoscopy and colonoscopy    Past Medical History:  Diagnosis Date   Anxiety    Depression    Diabetes mellitus, type II (HCC)    GERD (gastroesophageal reflux disease)    HLD (hyperlipidemia)    Hypertension    Thyroid disease     Past Surgical History:  Procedure Laterality Date   ABDOMINAL HYSTERECTOMY     BREAST EXCISIONAL BIOPSY Bilateral 80s   benign   BREAST SURGERY     CATARACT EXTRACTION W/PHACO Right 10/14/2020   Procedure: CATARACT EXTRACTION PHACO AND INTRAOCULAR LENS PLACEMENT (IOC)RIGHT  DIABETIC 5.54 00:54.6;  Surgeon: Lockie Mola, MD;  Location: [facility];  Service: Ophthalmology;  Laterality: Right;   CATARACT EXTRACTION W/PHACO Left 10/28/2020   Procedure: CATARACT EXTRACTION PHACO AND INTRAOCULAR LENS PLACEMENT (IOC) LEFT DIABETIC;  Surgeon: Lockie Mola, MD;  Location: [facility];  Service: Ophthalmology;  Laterality: Left;  8.13 01:09.5   COLONOSCOPY WITH PROPOFOL N/A 04/12/2019   Procedure: COLONOSCOPY WITH PROPOFOL;  Surgeon: Wyline Mood, MD;  Location: [facility] ENDOSCOPY;  Service: Gastroenterology;  Laterality: N/A;   EYE SURGERY     JOINT REPLACEMENT Right 2019    Prior to Admission medications   Medication Sig Start Date End Date Taking? Authorizing Provider  alendronate (FOSAMAX) 70 MG tablet Take 1 tablet (70 mg total) by mouth every 7 (seven) days. Take with a full glass of water on an empty stomach. 08/04/21  Yes Carlean Jews, NP  aspirin 81 MG tablet Take 1 tablet by mouth daily.   Yes [provider]  atorvastatin (LIPITOR) 40 MG tablet Take 1 tablet (40 mg total) by mouth daily. 05/11/22    Loyce Dys, MD  busPIRone (BUSPAR) 10 MG tablet TAKE 1/2 TO 1 TABLET BY MOUTH TWICE DAILY 01/28/22   Carlean Jews, NP  citalopram (CELEXA) 40 MG tablet TAKE 1 TABLET BY MOUTH EVERY DAY 06/29/22   Carlean Jews, NP  diclofenac Sodium (VOLTAREN) 1 % GEL Apply 4 g topically 4 (four) times daily. 05/25/20   Carlean Jews, NP  diltiazem (CARDIZEM CD) 240 MG 24 hr capsule TAKE 1 CAPSULE(240 MG) BY MOUTH DAILY 06/29/22   Carlean Jews, NP  Dulaglutide (TRULICITY) 0.75 MG/0.5ML SOPN Inject 0.75 mg into the skin once a week. 06/07/22   Carlean Jews, NP  Iron, Ferrous Sulfate, 325 (65 Fe) MG TABS Take 325 mg by mouth daily. 06/14/22   Melida Quitter, PA  levothyroxine (SYNTHROID) 50 MCG tablet TAKE 1 TABLET BY MOUTH EVERY DAY IN THE MORNING 07/05/22   Carlean Jews, NP  lisinopril (ZESTRIL) 20 MG tablet TAKE 1 TABLET BY MOUTH EVERY DAY 07/05/22   Carlean Jews, NP  loperamide (IMODIUM) 2 MG capsule Take 2 capsules (4 mg total) by mouth as needed for diarrhea or loose stools. 03/09/21   Carlean Jews, NP  metFORMIN (GLUCOPHAGE) 850 MG tablet TAKE 1 TABLET BY MOUTH TWICE A DAY WITH FOOD 07/05/22   Carlean Jews, NP  metoprolol tartrate (LOPRESSOR) 25 MG tablet TAKE 1 TABLET BY MOUTH TWICE A DAY 03/14/22   Carlean Jews, NP  pantoprazole (PROTONIX) 40  MG tablet TAKE 1 TABLET BY MOUTH TWICE A DAY 07/05/22   Boscia, Kathlynn Grate, NP  promethazine (PHENERGAN) 25 MG tablet TAKE 1 TABLET BY MOUTH TWICE A DAY AS NEEDED FOR NAUSEA OR VOMITING 01/10/22   Boscia, Heather E, NP  QUEtiapine (SEROQUEL) 50 MG tablet TAKE 1 TABLET BY MOUTH 3 TIMES DAILY. MAY TAKE 1 TABLET IN AFTERNOON IF NEEDED FOR SEVERE ANXIETY. 06/13/22   Carlean Jews, NP  sucralfate (CARAFATE) 1 g tablet TAKE 1 TABLET BY MOUTH FOUR TIMES A DAY AS NEEDED 11/29/21   Carlean Jews, NP  zolpidem (AMBIEN) 10 MG tablet TAKE 1 TABLET BY MOUTH AT BEDTIME AS NEEDED FOR SLEEP. 05/16/22   Carlean Jews, NP    Allergies as  of 06/16/2022   (No Known Allergies)    Family History  Problem Relation Age of Onset   Anxiety disorder Mother    Colon cancer Mother    Heart disease Father    Anxiety disorder Father    Obesity Sister    Anxiety disorder Sister    Depression Sister    Osteoporosis Sister    Breast cancer Paternal Aunt     Social History   Socioeconomic History   Marital status: Widowed    Spouse name: Not on file   Number of children: Not on file   Years of education: Not on file   Highest education level: Not on file  Occupational History   Not on file  Tobacco Use   Smoking status: Never   Smokeless tobacco: Never  Vaping Use   Vaping Use: Never used  Substance and Sexual Activity   Alcohol use: No    Alcohol/week: 0.0 standard drinks of alcohol   Drug use: Never   Sexual activity: Never  Other Topics Concern   Not on file  Social History Narrative   Not on file   Social Determinants of Health   Financial Resource Strain: Low Risk  (03/10/2022)   Overall Financial Resource Strain (CARDIA)    Difficulty of Paying Living Expenses: Not hard at all  Food Insecurity: No Food Insecurity (05/09/2022)   Hunger Vital Sign    Worried About Running Out of Food in the Last Year: Never true    Ran Out of Food in the Last Year: Never true  Transportation Needs: No Transportation Needs (05/09/2022)   PRAPARE - Administrator, Civil Service (Medical): No    Lack of Transportation (Non-Medical): No  Physical Activity: Insufficiently Active (03/10/2022)   Exercise Vital Sign    Days of Exercise per Week: 1 day    Minutes of Exercise per Session: 60 min  Stress: No Stress Concern Present (03/10/2022)   [employer] of Occupational Health - Occupational Stress Questionnaire    Feeling of Stress : Not at all  Social Connections: Moderately Isolated (03/10/2022)   Social Connection and Isolation Panel [NHANES]    Frequency of Communication with Friends and Family: More than three  times a week    Frequency of Social Gatherings with Friends and Family: More than three times a week    Attends Religious Services: Never    Database administrator or Organizations: Yes    Attends Banker Meetings: Never    Marital Status: Widowed  Intimate Partner Violence: Not At Risk (05/09/2022)   Humiliation, Afraid, Rape, and Kick questionnaire    Fear of Current or Ex-Partner: No    Emotionally Abused: No    Physically Abused:  No    Sexually Abused: No    Review of Systems: See HPI, otherwise negative ROS  Physical Exam: BP (!) 185/74   Pulse 79   Temp (!) 97.5 F (36.4 C) (Temporal)   Resp 18   Ht 5' (1.524 m)   Wt 80 kg   SpO2 95%   BMI 34.44 kg/m  General:   Alert,  pleasant and cooperative in NAD Head:  Normocephalic and atraumatic. Neck:  Supple; no masses or thyromegaly. Lungs:  Clear throughout to auscultation, normal respiratory effort.    Heart:  +S1, +S2, Regular rate and rhythm, No edema. Abdomen:  Soft, nontender and nondistended. Normal bowel sounds, without guarding, and without rebound.   Neurologic:  Alert and  oriented x4;  grossly normal neurologically.  Impression/Plan: [name] is here for an endoscopy and colonoscopy  to be performed for  evaluation of iron deficiencyu anemia    Risks, benefits, limitations, and alternatives regarding endoscopy have been reviewed with the patient.  Questions have been answered.  All parties agreeable.   Wyline Mood, MD  07/14/2022, 7:44 AM

## 2022-07-14 NOTE — Op Note (Addendum)
[facility] Gastroenterology Patient Name: [name] Procedure Date: 07/14/2022 7:17 AM MRN: [id number] Account #: [id number] Date of Birth: [birth year] Admit Type: Outpatient Age: 77 Room: [facility] ENDO ROOM 4 Gender: Female Note Status: Finalized Instrument Name: Upper Endoscope [id number] Procedure:             Upper GI endoscopy Indications:           Iron deficiency anemia Providers:             Wyline Mood MD, MD Referring MD:          No Local Md, MD (Referring MD) Medicines:             Monitored Anesthesia Care Complications:         No immediate complications. Procedure:             Pre-Anesthesia Assessment:                        - Prior to the procedure, a History and Physical was                         performed, and patient medications, allergies and                         sensitivities were reviewed. The patient's tolerance                         of previous anesthesia was reviewed.                        - The risks and benefits of the procedure and the                         sedation options and risks were discussed with the                         patient. All questions were answered and informed                         consent was obtained.                        - ASA Grade Assessment: II - A patient with mild                         systemic disease.                        After obtaining informed consent, the endoscope was                         passed under direct vision. Throughout the procedure,                         the patient's blood pressure, pulse, and oxygen                         saturations were monitored continuously. The Endoscope  was introduced through the mouth, and advanced to the                         third part of duodenum. The upper GI endoscopy was                         accomplished with ease. The patient tolerated the                         procedure well. Findings:      A 6 cm hiatal  hernia was present.      The esophagus was normal.      The cardia and gastric fundus were normal on retroflexion.      The examined duodenum was normal. Impression:            - 6 cm hiatal hernia.                        - Normal esophagus.                        - Normal examined duodenum.                        - No specimens collected. Recommendation:        - Perform a colonoscopy today. Procedure Code(s):     --- Professional ---                        [phone], Esophagogastroduodenoscopy, flexible,                         transoral; diagnostic, including collection of                         specimen(s) by brushing or washing, when performed                         (separate procedure) Diagnosis Code(s):     --- Professional ---                        K44.9, Diaphragmatic hernia without obstruction or                         gangrene                        D50.9, Iron deficiency anemia, unspecified CPT copyright 2022 American Medical Association. All rights reserved. The codes documented in this report are preliminary and upon coder review may  be revised to meet current compliance requirements. Wyline Mood, MD Wyline Mood MD, MD 07/14/2022 7:55:47 AM This report has been signed electronically. Number of Addenda: 0 Note Initiated On: 07/14/2022 7:17 AM Estimated Blood Loss:  Estimated blood loss: none.      [facility]

## 2022-07-14 NOTE — Anesthesia Preprocedure Evaluation (Signed)
Anesthesia Evaluation  Patient identified by MRN, date of birth, ID band Patient awake    Reviewed: Allergy & Precautions, NPO status , Patient's Chart, lab work & pertinent test results  History of Anesthesia Complications Negative for: history of anesthetic complications  Airway Mallampati: II  TM Distance: >3 FB Neck ROM: Full    Dental no notable dental hx. (+) Teeth Intact   Pulmonary sleep apnea , neg COPD, Patient abstained from smoking.Not current smoker   Pulmonary exam normal breath sounds clear to auscultation       Cardiovascular Exercise Tolerance: Good METShypertension, Pt. on medications (-) CAD and (-) Past MI (-) dysrhythmias  Rhythm:Regular Rate:Normal - Systolic murmurs    Neuro/Psych  PSYCHIATRIC DISORDERS Anxiety Depression     Neuromuscular disease negative neurological ROS     GI/Hepatic ,GERD  ,,(+)     (-) substance abuse    Endo/Other  diabetesHypothyroidism  Off of her weekly GLP1 injection for over 1 week. Denies GI symptoms today  Renal/GU negative Renal ROS     Musculoskeletal   Abdominal  (+) + obese  Peds  Hematology   Anesthesia Other Findings Past Medical History: No date: Anxiety No date: Depression No date: Diabetes mellitus, type II (HCC) No date: GERD (gastroesophageal reflux disease) No date: HLD (hyperlipidemia) No date: Hypertension No date: Thyroid disease  Reproductive/Obstetrics                             Anesthesia Physical Anesthesia Plan  ASA: 2  Anesthesia Plan: General   Post-op Pain Management: Minimal or no pain anticipated   Induction: Intravenous  PONV Risk Score and Plan: 3 and Propofol infusion, TIVA and Ondansetron  Airway Management Planned: Nasal Cannula  Additional Equipment: None  Intra-op Plan:   Post-operative Plan:   Informed Consent: I have reviewed the patients History and Physical, chart, labs and  discussed the procedure including the risks, benefits and alternatives for the proposed anesthesia with the patient or authorized representative who has indicated his/her understanding and acceptance.     Dental advisory given  Plan Discussed with: CRNA and Surgeon  Anesthesia Plan Comments: (Discussed risks of anesthesia with patient, including possibility of difficulty with spontaneous ventilation under anesthesia necessitating airway intervention, PONV, and rare risks such as cardiac or respiratory or neurological events, and allergic reactions. Discussed the role of CRNA in patient's perioperative care. Patient understands.)       Anesthesia Quick Evaluation

## 2022-07-15 ENCOUNTER — Encounter: Payer: Self-pay | Admitting: Gastroenterology

## 2022-07-17 ENCOUNTER — Other Ambulatory Visit: Payer: Self-pay | Admitting: Nurse Practitioner

## 2022-07-17 DIAGNOSIS — I10 Essential (primary) hypertension: Secondary | ICD-10-CM

## 2022-07-24 ENCOUNTER — Other Ambulatory Visit: Payer: Self-pay | Admitting: Nurse Practitioner

## 2022-07-24 DIAGNOSIS — E1165 Type 2 diabetes mellitus with hyperglycemia: Secondary | ICD-10-CM

## 2022-07-28 ENCOUNTER — Other Ambulatory Visit: Payer: Self-pay | Admitting: Nurse Practitioner

## 2022-07-28 DIAGNOSIS — R110 Nausea: Secondary | ICD-10-CM

## 2022-08-12 DIAGNOSIS — R300 Dysuria: Secondary | ICD-10-CM | POA: Diagnosis not present

## 2022-08-12 DIAGNOSIS — N390 Urinary tract infection, site not specified: Secondary | ICD-10-CM | POA: Diagnosis not present

## 2022-08-14 ENCOUNTER — Other Ambulatory Visit: Payer: Self-pay | Admitting: Nurse Practitioner

## 2022-08-14 DIAGNOSIS — F322 Major depressive disorder, single episode, severe without psychotic features: Secondary | ICD-10-CM

## 2022-08-15 ENCOUNTER — Other Ambulatory Visit: Payer: Self-pay

## 2022-08-16 ENCOUNTER — Telehealth: Payer: Self-pay | Admitting: *Deleted

## 2022-08-16 NOTE — Telephone Encounter (Signed)
LVM for pt to call office so we can reschedule her appointment from 09/07/22 due to provider no longer being here at practice.

## 2022-08-17 ENCOUNTER — Ambulatory Visit (INDEPENDENT_AMBULATORY_CARE_PROVIDER_SITE_OTHER): Payer: Medicare Other | Admitting: Physician Assistant

## 2022-08-17 ENCOUNTER — Encounter: Payer: Self-pay | Admitting: Physician Assistant

## 2022-08-17 VITALS — BP 126/78 | HR 74 | Temp 97.7°F | Ht 60.0 in | Wt 174.0 lb

## 2022-08-17 DIAGNOSIS — K449 Diaphragmatic hernia without obstruction or gangrene: Secondary | ICD-10-CM | POA: Diagnosis not present

## 2022-08-17 DIAGNOSIS — K219 Gastro-esophageal reflux disease without esophagitis: Secondary | ICD-10-CM

## 2022-08-17 DIAGNOSIS — D509 Iron deficiency anemia, unspecified: Secondary | ICD-10-CM | POA: Diagnosis not present

## 2022-08-17 NOTE — Progress Notes (Signed)
Celso Amy, PA-C [address]  [address]  [place], [place] [address]  Main: [phone]  Fax: [phone]   Primary Care Physician: Sandre Kitty, MD  Primary Gastroenterologist:  Celso Amy, PA-C / Dr. Wyline Mood    CC:  F/U IDA  HPI: [name] is a 77 y.o. female returns for follow-up of iron deficiency anemia.  -Last lab 06/15/2022 showed hemoglobin 9.8, MCV 73, total iron 17, iron saturation 4%, ferritin 9.  She is currently taking iron and vitamin C once daily.  No longer taking meloxicam, NSAIDs, or any blood thinners.  Takes Tylenol as needed.  Currently takes pantoprazole 40 Mg twice daily and sucralfate 1 g twice daily.  She denies any current GI symptoms or concerns.  -EGD 07/14/2022 showed 6 cm hiatal hernia, otherwise normal esophagus, stomach, and duodenum.  No biopsies.  -Colon 07/14/2022 showed 1 small 5 mm adenomatous polyp removed from transverse colon.  Excellent prep.  Pandiverticulosis with no signs of bleeding.  No further colonoscopy recommended due to advanced age.  She Was Hospitalized 05/09/22 until 05/10/2022 with presyncope, orthostatic hypotension, leukocytosis, type 2 diabetes with hyperglycemia, bradycardia, and gait instability.  Hemoglobin was 8.9g.  She did not have any GI consult or procedures during hospitalization.    Lab 05/10/2022 showed hemoglobin 8.9, hematocrit 29, MCV 73, platelets 269.  Lab 10/2021 showed hemoglobin 9.9.  Labs 01/2021 showed hemoglobin 12.6 (baseline).   Last abdominal pelvic CT with contrast 10/2021 showed descending colon colitis (similar to 2018).  Large hiatal hernia.  Diverticulosis with no diverticulitis.   Colonoscopy 04/2019 by Dr. Tobi Bastos showed 1 small adenomatous and 1 small benign polyp removed, excellent prep, sigmoid diverticulosis.  Current Outpatient Medications  Medication Sig Dispense Refill   alendronate (FOSAMAX) 70 MG tablet Take 1 tablet (70 mg total) by mouth every 7 (seven) days. Take with a  full glass of water on an empty stomach. 12 tablet 3   aspirin 81 MG tablet Take 1 tablet by mouth daily.     atorvastatin (LIPITOR) 40 MG tablet Take 1 tablet (40 mg total) by mouth daily. 30 tablet 3   busPIRone (BUSPAR) 10 MG tablet TAKE 1/2 TO 1 TABLET BY MOUTH TWICE DAILY 180 tablet 1   cefdinir (OMNICEF) 300 MG capsule Take by mouth.     citalopram (CELEXA) 40 MG tablet TAKE 1 TABLET BY MOUTH EVERY DAY 90 tablet 0   COMIRNATY syringe      diclofenac Sodium (VOLTAREN) 1 % GEL Apply 4 g topically 4 (four) times daily. 100 g 2   diltiazem (CARDIZEM CD) 240 MG 24 hr capsule TAKE 1 CAPSULE(240 MG) BY MOUTH DAILY 90 capsule 0   Dulaglutide (TRULICITY) 0.75 MG/0.5ML SOPN INJECT 0.75 MG SUBCUTANEOUSLY ONE TIME PER WEEK 0.5 mL 1   Iron, Ferrous Sulfate, 325 (65 Fe) MG TABS Take 325 mg by mouth daily. 30 tablet 3   levothyroxine (SYNTHROID) 50 MCG tablet TAKE 1 TABLET BY MOUTH EVERY DAY IN THE MORNING 90 tablet 1   lisinopril (ZESTRIL) 20 MG tablet TAKE 1 TABLET BY MOUTH EVERY DAY 90 tablet 0   loperamide (IMODIUM) 2 MG capsule Take 2 capsules (4 mg total) by mouth as needed for diarrhea or loose stools. 90 capsule 2   metFORMIN (GLUCOPHAGE) 850 MG tablet TAKE 1 TABLET BY MOUTH TWICE A DAY WITH FOOD 180 tablet 0   metoprolol tartrate (LOPRESSOR) 25 MG tablet TAKE 1 TABLET BY MOUTH TWICE A DAY 180 tablet 0  pantoprazole (PROTONIX) 40 MG tablet TAKE 1 TABLET BY MOUTH TWICE A DAY 180 tablet 0   polyethylene glycol-electrolytes (NULYTELY) 420 g solution      promethazine (PHENERGAN) 25 MG tablet TAKE 1 TABLET BY MOUTH TWICE A DAY AS NEEDED FOR NAUSEA OR VOMITING 30 tablet 0   QUEtiapine (SEROQUEL) 50 MG tablet TAKE 1 TABLET BY MOUTH 3 TIMES DAILY. MAY TAKE 1 TABLET IN AFTERNOON IF NEEDED FOR SEVERE ANXIETY. 270 tablet 0   sucralfate (CARAFATE) 1 g tablet TAKE 1 TABLET BY MOUTH FOUR TIMES A DAY AS NEEDED 360 tablet 1   zolpidem (AMBIEN) 10 MG tablet TAKE 1 TABLET BY MOUTH AT BEDTIME AS NEEDED FOR SLEEP.  90 tablet 1   No current facility-administered medications for this visit.    Allergies as of 08/17/2022   (No Known Allergies)    Past Medical History:  Diagnosis Date   Anxiety    Depression    Diabetes mellitus, type II (HCC)    GERD (gastroesophageal reflux disease)    HLD (hyperlipidemia)    Hypertension    Thyroid disease     Past Surgical History:  Procedure Laterality Date   ABDOMINAL HYSTERECTOMY     BREAST EXCISIONAL BIOPSY Bilateral 80s   benign   BREAST SURGERY     CATARACT EXTRACTION W/PHACO Right 10/14/2020   Procedure: CATARACT EXTRACTION PHACO AND INTRAOCULAR LENS PLACEMENT (IOC)RIGHT  DIABETIC 5.54 00:54.6;  Surgeon: Lockie Mola, MD;  Location: [facility];  Service: Ophthalmology;  Laterality: Right;   CATARACT EXTRACTION W/PHACO Left 10/28/2020   Procedure: CATARACT EXTRACTION PHACO AND INTRAOCULAR LENS PLACEMENT (IOC) LEFT DIABETIC;  Surgeon: Lockie Mola, MD;  Location: [facility];  Service: Ophthalmology;  Laterality: Left;  8.13 01:09.5   COLONOSCOPY N/A 07/14/2022   Procedure: COLONOSCOPY;  Surgeon: Wyline Mood, MD;  Location[facility] END[facility];  Service: Gastroenterology;  Laterality: N/A;   COLONOSCOPY WITH PROPOFOL N/A 04/12/2019   Procedure: COLONOSCOPY WITH PROPOFOL;  Surgeon: Wyline Mood, MD;  Location: [facility] ENDOSCOPY;  Service: Gastroenterology;  Laterality: N/A;   ESOPHAGOGASTRODUODENOSCOPY (EGD) WITH PROPOFOL N/A 07/14/2022   Procedure: ESOPHAGOGASTRODUODENOSCOPY (EGD) WITH PROPOFOL;  Surgeon: Wyline Mood, MD;  Location: [facility] END[facility];  Service: Gastroenterology;  Laterality: N/A;   EYE SURGERY     JOINT REPLACEMENT Right 2019    Review of Systems:    All systems reviewed and negative except where noted in HPI.   Physical Examination:   BP 126/78   Pulse 74   Temp 97.7 F (36.5 C)   Ht 5' (1.524 m)   Wt 174 lb (78.9 kg)   BMI 33.98 kg/m   General: Well-nourished, feeble elderly female; in no acute  distress.  Eyes: No icterus. Conjunctivae pink. Neuro: Alert and oriented x 3.  Grossly intact.  Walks with a walker. Skin: Warm and dry, no jaundice.   Psych: Alert and cooperative, normal mood and affect.   Imaging Studies: No results found.  Assessment and Plan:   [name] is a 77 y.o. y/o female returns for follow-up of iron deficiency anemia, stable and improved.  Recent EGD and colonoscopy showed no source for iron deficiency anemia.  Incidental medium hiatal hernia and 1 small adenomatous colon polyp removed.  I am scheduling capsule endoscopy to complete her GI evaluation for iron deficiency anemia.  She denies any current GI symptoms.  She is not taking any meloxicam or NSAIDs.  1.  Iron deficiency anemia, improving  Scheduling capsule endoscopy to complete her GI evaluation.  Continue oral iron and vitamin C once daily.  Lab: Repeat CBC, iron, and ferritin.  2.  Medium hiatal hernia  Patient education discussed.  Continue pantoprazole and sucralfate for GERD.  3.  History of colon polyps  No further colonoscopy recommended due to advanced age.    Celso Amy, PA-C  Follow up will be based on capsule endoscopy results, and GI symptoms.

## 2022-08-18 ENCOUNTER — Telehealth: Payer: Self-pay

## 2022-08-18 ENCOUNTER — Encounter: Payer: Self-pay | Admitting: Physician Assistant

## 2022-08-18 LAB — IRON,TIBC AND FERRITIN PANEL
Ferritin: 29 ng/mL (ref 15–150)
Iron Saturation: 15 % (ref 15–55)
Iron: 50 ug/dL (ref 27–139)
Total Iron Binding Capacity: 332 ug/dL (ref 250–450)
UIBC: 282 ug/dL (ref 118–369)

## 2022-08-18 LAB — CBC
Hematocrit: 40.7 % (ref 34.0–46.6)
Hemoglobin: 12.5 g/dL (ref 11.1–15.9)
MCH: 25.5 pg — ABNORMAL LOW (ref 26.6–33.0)
MCHC: 30.7 g/dL — ABNORMAL LOW (ref 31.5–35.7)
MCV: 83 fL (ref 79–97)
Platelets: 332 10*3/uL (ref 150–450)
RBC: 4.91 x10E6/uL (ref 3.77–5.28)
RDW: 20.7 % — ABNORMAL HIGH (ref 11.7–15.4)
WBC: 12.4 10*3/uL — ABNORMAL HIGH (ref 3.4–10.8)

## 2022-08-18 NOTE — Progress Notes (Signed)
Iron deficiency anemia has greatly improved.  Hemoglobin has improved to normal, 12.5.  Iron has improved to normal.

## 2022-08-18 NOTE — Telephone Encounter (Signed)
Patient notified.  Iron deficiency anemia has greatly improved.  Hemoglobin has improved to normal, 12.5.  Iron has improved to normal.

## 2022-08-27 ENCOUNTER — Other Ambulatory Visit: Payer: Self-pay | Admitting: Nurse Practitioner

## 2022-08-27 DIAGNOSIS — F411 Generalized anxiety disorder: Secondary | ICD-10-CM

## 2022-08-31 ENCOUNTER — Encounter
Admission: RE | Disposition: A | Discharge status: Home / Self Care | Payer: Self-pay | Source: Home / Self Care | Attending: Gastroenterology

## 2022-08-31 ENCOUNTER — Encounter: Payer: Self-pay | Admitting: General Practice

## 2022-08-31 ENCOUNTER — Ambulatory Visit
Admission: RE | Admit: 2022-08-31 | Discharge: 2022-08-31 | Disposition: A | Discharge status: Home / Self Care | Payer: Medicare Other | Attending: Gastroenterology | Admitting: Gastroenterology

## 2022-08-31 DIAGNOSIS — D509 Iron deficiency anemia, unspecified: Secondary | ICD-10-CM | POA: Insufficient documentation

## 2022-08-31 HISTORY — PX: GIVENS CAPSULE STUDY: SHX5432

## 2022-08-31 SURGERY — IMAGING PROCEDURE, GI TRACT, INTRALUMINAL, VIA CAPSULE

## 2022-09-01 ENCOUNTER — Encounter: Payer: Self-pay | Admitting: Gastroenterology

## 2022-09-04 ENCOUNTER — Other Ambulatory Visit: Payer: Self-pay | Admitting: Family Medicine

## 2022-09-04 ENCOUNTER — Other Ambulatory Visit: Payer: Self-pay | Admitting: Nurse Practitioner

## 2022-09-04 DIAGNOSIS — M810 Age-related osteoporosis without current pathological fracture: Secondary | ICD-10-CM

## 2022-09-04 DIAGNOSIS — D509 Iron deficiency anemia, unspecified: Secondary | ICD-10-CM

## 2022-09-04 DIAGNOSIS — F322 Major depressive disorder, single episode, severe without psychotic features: Secondary | ICD-10-CM

## 2022-09-07 ENCOUNTER — Ambulatory Visit: Payer: TRICARE For Life (TFL) | Admitting: Nurse Practitioner

## 2022-09-08 ENCOUNTER — Encounter: Payer: Self-pay | Admitting: Family Medicine

## 2022-09-08 ENCOUNTER — Ambulatory Visit (INDEPENDENT_AMBULATORY_CARE_PROVIDER_SITE_OTHER): Payer: Medicare Other | Admitting: Family Medicine

## 2022-09-08 VITALS — BP 122/77 | HR 92 | Ht 60.0 in | Wt 173.1 lb

## 2022-09-08 DIAGNOSIS — D509 Iron deficiency anemia, unspecified: Secondary | ICD-10-CM

## 2022-09-08 DIAGNOSIS — Z7984 Long term (current) use of oral hypoglycemic drugs: Secondary | ICD-10-CM | POA: Diagnosis not present

## 2022-09-08 DIAGNOSIS — E1165 Type 2 diabetes mellitus with hyperglycemia: Secondary | ICD-10-CM

## 2022-09-08 DIAGNOSIS — F322 Major depressive disorder, single episode, severe without psychotic features: Secondary | ICD-10-CM

## 2022-09-08 DIAGNOSIS — M810 Age-related osteoporosis without current pathological fracture: Secondary | ICD-10-CM | POA: Diagnosis not present

## 2022-09-08 LAB — POCT GLYCOSYLATED HEMOGLOBIN (HGB A1C): HbA1c POC (<> result, manual entry): 7.1 % (ref 4.0–5.6)

## 2022-09-08 MED ORDER — FERROUS SULFATE 325 (65 FE) MG PO TABS
325.0000 mg | ORAL_TABLET | Freq: Every day | ORAL | 1 refills | Status: DC
Start: 2022-09-08 — End: 2023-05-11

## 2022-09-08 MED ORDER — ATORVASTATIN CALCIUM 40 MG PO TABS: 40.0000 mg | ORAL_TABLET | Freq: Every day | ORAL | 1 refills | Status: DC

## 2022-09-08 MED ORDER — ALENDRONATE SODIUM 70 MG PO TABS
70.0000 mg | ORAL_TABLET | ORAL | 3 refills | Status: DC
Start: 2022-09-08 — End: 2023-10-23

## 2022-09-08 MED ORDER — QUETIAPINE FUMARATE 150 MG PO TABS: 75.0000 mg | ORAL_TABLET | Freq: Three times a day (TID) | ORAL | 1 refills | Status: DC

## 2022-09-08 NOTE — Assessment & Plan Note (Signed)
>>  ASSESSMENT AND PLAN FOR DEPRESSION, MAJOR, SINGLE EPISODE, SEVERE (HCC) WRITTEN ON 09/08/2022 12:34 PM BY Satchel Heidinger K, MD  Increasing Seroquel to 75 3 times daily from 50 3 times daily.

## 2022-09-08 NOTE — Patient Instructions (Signed)
It was nice to see you today,  We addressed the following topics today: -I increased your Seroquel dose to 75 mg 3 times a day.  They do not have a 75 mg tablet so I ordered 150 mg tablets.  You will need to cut them in half and take one half of a tablet 3 times a day. - I am going to order some blood test for your diabetes to confirm your A1c results - I have refilled your other medications.  Have a great day,  Frederic Jericho, MD

## 2022-09-08 NOTE — Assessment & Plan Note (Signed)
Improved.  Will get fructosamine to confirm accuracy of A1c given her history of chronic anemia.

## 2022-09-08 NOTE — Assessment & Plan Note (Signed)
Increasing Seroquel to 75 3 times daily from 50 3 times daily.

## 2022-09-08 NOTE — Progress Notes (Signed)
   Established Patient Office Visit  Subjective   Patient ID: [name], female    DOB: 1945-10-13  Age: 77 y.o. MRN: [id number]  Chief Complaint  Patient presents with   Medical Management of Chronic Issues    HPI Seroquel -patient would like to increase her dose of Seroquel.  She wants to know if she can take the 50 mg 4 times a day instead of 3 times a day.  We then talked about increasing it from 50 to 75 mg 3 times a day.  Patient will like to increase to 75 mg 3 times daily.  Diabetes-patient's blood sugar was better.  We discussed confirming her A1c results with the fructosamine test.  Patient sees an eye doctor yearly at [facility]  Patient would like refills of her statin, iron, alendronate.  No complaints with any of these medications.  No side effects.  Patient recently had a capsule study but does not know results of it yet.    The ASCVD Risk score (Arnett DK, et al., 2019) failed to calculate for the following reasons:   The valid total cholesterol range is 130 to 320 mg/dL     ROS    Objective:     BP 122/77   Pulse 92   Ht 5' (1.524 m)   Wt 173 lb 1.9 oz (78.5 kg)   SpO2 100%   BMI 33.81 kg/m    Physical Exam General: Alert and oriented.  Companied by daughter Pulmonary: No respiratory distress Foot: Normal foot exam   Results for orders placed or performed in visit on 09/08/22  POCT HgB A1C  Result Value Ref Range   Hemoglobin A1C     HbA1c POC (<> result, manual entry) 7.1 4.0 - 5.6 %   HbA1c, POC (prediabetic range)     HbA1c, POC (controlled diabetic range)            Assessment & Plan:   Type 2 diabetes mellitus with hyperglycemia, without long-term current use of insulin (HCC) Assessment & Plan: Improved.  Will get fructosamine to confirm accuracy of A1c given her history of chronic anemia.  Orders: -     POCT glycosylated hemoglobin (Hb A1C) -     Fructosamine  Age related osteoporosis, unspecified  pathological fracture presence -     Comprehensive metabolic panel -     Alendronate Sodium; Take 1 tablet (70 mg total) by mouth once a week. Take with a full glass of water on an empty stomach.  Dispense: 12 tablet; Refill: 3  Iron deficiency anemia, unspecified iron deficiency anemia type -     Ferrous Sulfate; Take 1 tablet (325 mg total) by mouth daily with breakfast.  Dispense: 90 tablet; Refill: 1  Depression, major, single episode, severe (HCC) Assessment & Plan: Increasing Seroquel to 75 3 times daily from 50 3 times daily.  Orders: -     QUEtiapine Fumarate; Take 75 mg by mouth in the morning, at noon, and at bedtime.  Dispense: 135 tablet; Refill: 1  Other orders -     Atorvastatin Calcium; Take 1 tablet (40 mg total) by mouth daily.  Dispense: 90 tablet; Refill: 1     Return in about 3 months (around 12/09/2022) for DM.    Sandre Kitty, MD

## 2022-09-09 ENCOUNTER — Other Ambulatory Visit: Payer: Self-pay | Admitting: Family Medicine

## 2022-09-10 ENCOUNTER — Other Ambulatory Visit: Payer: Self-pay | Admitting: Family Medicine

## 2022-09-10 DIAGNOSIS — M5440 Lumbago with sciatica, unspecified side: Secondary | ICD-10-CM

## 2022-09-12 ENCOUNTER — Other Ambulatory Visit: Payer: Self-pay | Admitting: Family Medicine

## 2022-09-12 MED ORDER — TRAMADOL HCL 50 MG PO TABS: 50.0000 mg | ORAL_TABLET | Freq: Three times a day (TID) | ORAL | 0 refills | Status: DC | PRN

## 2022-09-16 ENCOUNTER — Other Ambulatory Visit: Payer: TRICARE For Life (TFL)

## 2022-09-22 ENCOUNTER — Other Ambulatory Visit: Payer: Self-pay | Admitting: Nurse Practitioner

## 2022-09-22 DIAGNOSIS — E1165 Type 2 diabetes mellitus with hyperglycemia: Secondary | ICD-10-CM

## 2022-09-23 ENCOUNTER — Other Ambulatory Visit: Payer: Self-pay | Admitting: Nurse Practitioner

## 2022-09-23 DIAGNOSIS — I10 Essential (primary) hypertension: Secondary | ICD-10-CM

## 2022-09-23 DIAGNOSIS — F322 Major depressive disorder, single episode, severe without psychotic features: Secondary | ICD-10-CM

## 2022-10-09 ENCOUNTER — Other Ambulatory Visit: Payer: Self-pay | Admitting: Nurse Practitioner

## 2022-10-09 DIAGNOSIS — I10 Essential (primary) hypertension: Secondary | ICD-10-CM

## 2022-10-12 ENCOUNTER — Other Ambulatory Visit: Payer: Self-pay | Admitting: Family Medicine

## 2022-10-12 DIAGNOSIS — I10 Essential (primary) hypertension: Secondary | ICD-10-CM

## 2022-10-12 MED ORDER — LISINOPRIL 20 MG PO TABS: 20.0000 mg | ORAL_TABLET | Freq: Every day | ORAL | 1 refills | Status: DC

## 2022-10-18 ENCOUNTER — Other Ambulatory Visit: Payer: Self-pay | Admitting: Family Medicine

## 2022-11-06 ENCOUNTER — Other Ambulatory Visit: Payer: Self-pay | Admitting: Family Medicine

## 2022-11-06 DIAGNOSIS — G4709 Other insomnia: Secondary | ICD-10-CM

## 2022-11-12 ENCOUNTER — Other Ambulatory Visit: Payer: Self-pay | Admitting: Nurse Practitioner

## 2022-11-12 DIAGNOSIS — E1165 Type 2 diabetes mellitus with hyperglycemia: Secondary | ICD-10-CM

## 2022-11-14 MED ORDER — METFORMIN HCL 850 MG PO TABS
850.0000 mg | ORAL_TABLET | Freq: Two times a day (BID) | ORAL | 3 refills | Status: DC
Start: 2022-11-14 — End: 2023-11-03

## 2022-11-16 ENCOUNTER — Other Ambulatory Visit: Payer: Self-pay | Admitting: Family Medicine

## 2022-11-16 DIAGNOSIS — K219 Gastro-esophageal reflux disease without esophagitis: Secondary | ICD-10-CM

## 2022-11-21 ENCOUNTER — Other Ambulatory Visit: Payer: Self-pay | Admitting: Family Medicine

## 2022-11-23 ENCOUNTER — Other Ambulatory Visit: Payer: Self-pay | Admitting: Family Medicine

## 2022-11-23 DIAGNOSIS — F411 Generalized anxiety disorder: Secondary | ICD-10-CM

## 2022-12-09 ENCOUNTER — Ambulatory Visit: Payer: TRICARE For Life (TFL) | Admitting: Family Medicine

## 2022-12-13 ENCOUNTER — Ambulatory Visit: Payer: TRICARE For Life (TFL) | Admitting: Family Medicine

## 2022-12-13 NOTE — Progress Notes (Deleted)
   Established Patient Office Visit  Subjective   Patient ID: [name], female    DOB: 01/06/1946  Age: 77 y.o. MRN: [id number]  No chief complaint on file.   HPI  Seroquel-75 3 times daily  DM2-UACR, ophthalmology exam  Anemia-capsule study?  Still do not see results   The ASCVD Risk score (Arnett DK, et al., 2019) failed to calculate for the following reasons:   The valid total cholesterol range is 130 to 320 mg/dL  Health Maintenance Due  Topic Date Due   Diabetic kidney evaluation - Urine ACR  Never done   OPHTHALMOLOGY EXAM  06/05/2022   INFLUENZA VACCINE  09/08/2022   COVID-19 Vaccine (8 - 2023-24 season) 10/09/2022      Objective:     There were no vitals taken for this visit. {Vitals History (Optional):23777}  Physical Exam   No results found for any visits on 12/13/22.      Assessment & Plan:   There are no diagnoses linked to this encounter.   No follow-ups on file.    Sandre Kitty, MD

## 2022-12-21 ENCOUNTER — Other Ambulatory Visit: Payer: Self-pay | Admitting: Family Medicine

## 2022-12-21 DIAGNOSIS — F322 Major depressive disorder, single episode, severe without psychotic features: Secondary | ICD-10-CM

## 2022-12-21 DIAGNOSIS — I10 Essential (primary) hypertension: Secondary | ICD-10-CM

## 2022-12-22 ENCOUNTER — Encounter: Payer: Self-pay | Admitting: Family Medicine

## 2022-12-22 ENCOUNTER — Ambulatory Visit (INDEPENDENT_AMBULATORY_CARE_PROVIDER_SITE_OTHER): Payer: Medicare Other | Admitting: Family Medicine

## 2022-12-22 VITALS — BP 139/75 | HR 73 | Ht 60.0 in | Wt 175.8 lb

## 2022-12-22 DIAGNOSIS — G8929 Other chronic pain: Secondary | ICD-10-CM

## 2022-12-22 DIAGNOSIS — E039 Hypothyroidism, unspecified: Secondary | ICD-10-CM | POA: Diagnosis not present

## 2022-12-22 DIAGNOSIS — D509 Iron deficiency anemia, unspecified: Secondary | ICD-10-CM

## 2022-12-22 DIAGNOSIS — F322 Major depressive disorder, single episode, severe without psychotic features: Secondary | ICD-10-CM

## 2022-12-22 DIAGNOSIS — K580 Irritable bowel syndrome with diarrhea: Secondary | ICD-10-CM | POA: Diagnosis not present

## 2022-12-22 DIAGNOSIS — Z7984 Long term (current) use of oral hypoglycemic drugs: Secondary | ICD-10-CM | POA: Diagnosis not present

## 2022-12-22 DIAGNOSIS — M5441 Lumbago with sciatica, right side: Secondary | ICD-10-CM

## 2022-12-22 DIAGNOSIS — E1165 Type 2 diabetes mellitus with hyperglycemia: Secondary | ICD-10-CM | POA: Diagnosis not present

## 2022-12-22 DIAGNOSIS — H60543 Acute eczematoid otitis externa, bilateral: Secondary | ICD-10-CM

## 2022-12-22 DIAGNOSIS — F411 Generalized anxiety disorder: Secondary | ICD-10-CM

## 2022-12-22 DIAGNOSIS — D72829 Elevated white blood cell count, unspecified: Secondary | ICD-10-CM | POA: Diagnosis not present

## 2022-12-22 DIAGNOSIS — H60549 Acute eczematoid otitis externa, unspecified ear: Secondary | ICD-10-CM | POA: Insufficient documentation

## 2022-12-22 DIAGNOSIS — F331 Major depressive disorder, recurrent, moderate: Secondary | ICD-10-CM

## 2022-12-22 MED ORDER — BUSPIRONE HCL 10 MG PO TABS: 20.0000 mg | ORAL_TABLET | Freq: Two times a day (BID) | ORAL | 11 refills | Status: DC

## 2022-12-22 MED ORDER — QUETIAPINE FUMARATE 50 MG PO TABS: 75.0000 mg | ORAL_TABLET | Freq: Three times a day (TID) | ORAL | 11 refills | Status: DC

## 2022-12-22 MED ORDER — FLUOCINOLONE ACETONIDE 0.01 % OT OIL: 5.0000 [drp] | TOPICAL_OIL | Freq: Two times a day (BID) | OTIC | 1 refills | Status: AC

## 2022-12-22 MED ORDER — LOPERAMIDE HCL 2 MG PO CAPS
4.0000 mg | ORAL_CAPSULE | ORAL | 2 refills | Status: AC | PRN
Start: 2022-12-22 — End: ?

## 2022-12-22 MED ORDER — LIDOCAINE 4 % EX PTCH: 1.0000 | MEDICATED_PATCH | CUTANEOUS | 2 refills | Status: DC

## 2022-12-22 MED ORDER — NAPROXEN 375 MG PO TABS: 375.0000 mg | ORAL_TABLET | Freq: Two times a day (BID) | ORAL | 2 refills | Status: DC | PRN

## 2022-12-22 NOTE — Assessment & Plan Note (Signed)
Taking tramadol as needed.  Added lidocaine patch and prescription for naproxen to use only if tramadol ineffective.  Advised not to use naproxen more than 1 week at a time.

## 2022-12-22 NOTE — Patient Instructions (Addendum)
It was nice to see you today,  We addressed the following topics today: -I have changed your BuSpar to 2 tablets twice a day.  I have changed your Seroquel dosing to 50 mg tablets.  Take 1.5 tablets 3 times a day. - I have sent in prescription for naproxen and lidocaine patch.  Do not use the naproxen every day for more than a week - I would reach out to the gastroenterologist regarding your capsule study results.  I am not able to see them. - I will get some test for your diabetes.  I will send you with a urine specimen cup to use at home and bring back when it is filled so that we can check for diabetic nephropathy. - I have sent in some eardrops for you to use.  Use 5 drops twice a day for the next 2 weeks.  Have a great day,  Frederic Jericho, MD

## 2022-12-22 NOTE — Assessment & Plan Note (Signed)
Will recheck CBC today.  Recommended patient reach out to gastroenterology office regarding results of her capsule study a few months ago.

## 2022-12-22 NOTE — Progress Notes (Signed)
Established Patient Office Visit  Subjective   Patient ID: [name], female    DOB: Aug 04, 1945  Age: 77 y.o. MRN: [id number]  Chief Complaint  Patient presents with   Medical Management of Chronic Issues    HPI  Anxiety and depression-patient taking the Seroquel 75 mg 3 times daily.  States the 150 mg dose cut in half is more expensive than the 50 mg dose.  Will I get the change from 150 to 50 mg and take 1.5 tablets.  Patient still gets panic attacks lasting a few minutes 3-4 times a week.  We discussed abortive treatments and how they would not likely be effective if panic attacks along last a few minutes.  Patient agreeable to increasing her BuSpar dose which she is currently taking at 20 mg once in the morning.  DM2-patient has not seen her eye doctor recently.  She continues to take dulaglutide and metformin.  Is unable to give a urine sample today because she cannot use the bathroom in a public restroom.  Is okay with taking the cup home and bringing a urine sample back.  Patient ophthalmologist is Dr. Inez Pilgrim  Patient has not heard from the gastroenterologist regarding the capsule study.  We discussed how I do not see the results and I would recommend she reaches out to the gastroenterologist.  Patient complains that her chronic back pain does sometimes not treatable with tramadol alone.  Her daughter gave her some of her naproxen and lidocaine patches and this helped her with her back pain.  She has used meloxicam in the past but stopped when told long-term NSAIDs can cause cardiac and renal disease.  Patient complaining of itchiness in her ears.  Has used a eardrop in the past but does not know the name of it and does not currently have it.  The ASCVD Risk score (Arnett DK, et al., 2019) failed to calculate for the following reasons:   The valid total cholesterol range is 130 to 320 mg/dL  Health Maintenance Due  Topic Date Due   Diabetic kidney evaluation - Urine  ACR  Never done   OPHTHALMOLOGY EXAM  06/05/2022      Objective:     BP 139/75   Pulse 73   Ht 5' (1.524 m)   Wt 175 lb 12.8 oz (79.7 kg)   SpO2 94%   BMI 34.33 kg/m    Physical Exam General: Alert, oriented HEENT: Dry scaly skin along the outer ear canal CV: Regular rhythm Pulmonary lungs clear bilaterally   No results found for any visits on 12/22/22.      Assessment & Plan:   Type 2 diabetes mellitus with hyperglycemia, without long-term current use of insulin (HCC) Assessment & Plan: Continues to take metformin and Trulicity.  Will get A1c today.  Patient will bring back urine sample for UACR.  Recommended patient get her yearly diabetic retinopathy screen.  Ophthalmologist is Dr. Inez Pilgrim  Orders: -     Hemoglobin A1c -     Microalbumin / creatinine urine ratio  Depression, major, single episode, severe (HCC) -     QUEtiapine Fumarate; Take 1.5 tablets (75 mg total) by mouth 3 (three) times daily.  Dispense: 135 tablet; Refill: 11  Anxiety, generalized Assessment & Plan: Increasing BuSpar from 20 mg every morning to 20 twice daily.  Orders: -     busPIRone HCl; Take 2 tablets (20 mg total) by mouth 2 (two) times daily. Take 1/2 to 1 tablet by  mouth twice daily.  Dispense: 120 tablet; Refill: 11  Hypothyroidism, unspecified type -     TSH  Iron deficiency anemia, unspecified iron deficiency anemia type Assessment & Plan: Will recheck CBC today.  Recommended patient reach out to gastroenterology office regarding results of her capsule study a few months ago.  Orders: -     CBC  Irritable bowel syndrome with diarrhea -     Loperamide HCl; Take 2 capsules (4 mg total) by mouth as needed for diarrhea or loose stools.  Dispense: 90 capsule; Refill: 2  Depression, major, recurrent, moderate (HCC) Assessment & Plan: Switching Seroquel from the 150 mg tablets to 50 mg tablets and continuing 75 mg 3 times daily   Eczema of both external ears Assessment  & Plan: Prescribed fluocinolone eardrops to use twice a day for 2 weeks  Orders: -     Fluocinolone Acetonide; Place 5 drops in ear(s) in the morning and at bedtime for 14 days.  Dispense: 20 mL; Refill: 1  Chronic right-sided low back pain with right-sided sciatica Assessment & Plan: Taking tramadol as needed.  Added lidocaine patch and prescription for naproxen to use only if tramadol ineffective.  Advised not to use naproxen more than 1 week at a time.  Orders: -     Lidocaine; Place 1 patch onto the skin daily.  Dispense: 15 patch; Refill: 2 -     Naproxen; Take 1 tablet (375 mg total) by mouth 2 (two) times daily as needed for moderate pain (pain score 4-6).  Dispense: 30 tablet; Refill: 2     Return in about 3 months (around 03/24/2023) for DM.    Sandre Kitty, MD

## 2022-12-22 NOTE — Assessment & Plan Note (Signed)
Continues to take metformin and Trulicity.  Will get A1c today.  Patient will bring back urine sample for UACR.  Recommended patient get her yearly diabetic retinopathy screen.  Ophthalmologist is Dr. Inez Pilgrim

## 2022-12-22 NOTE — Assessment & Plan Note (Signed)
Prescribed fluocinolone eardrops to use twice a day for 2 weeks

## 2022-12-22 NOTE — Assessment & Plan Note (Signed)
Increasing BuSpar from 20 mg every morning to 20 twice daily.

## 2022-12-22 NOTE — Assessment & Plan Note (Signed)
Switching Seroquel from the 150 mg tablets to 50 mg tablets and continuing 75 mg 3 times daily

## 2022-12-23 ENCOUNTER — Other Ambulatory Visit: Payer: Self-pay | Admitting: Family Medicine

## 2022-12-23 DIAGNOSIS — D72829 Elevated white blood cell count, unspecified: Secondary | ICD-10-CM

## 2022-12-23 LAB — HEMOGLOBIN A1C
Est. average glucose Bld gHb Est-mCnc: 151 mg/dL
Hgb A1c MFr Bld: 6.9 % — ABNORMAL HIGH (ref 4.8–5.6)

## 2022-12-23 LAB — CBC
Hematocrit: 39.5 % (ref 34.0–46.6)
Hemoglobin: 12.7 g/dL (ref 11.1–15.9)
MCH: 29.7 pg (ref 26.6–33.0)
MCHC: 32.2 g/dL (ref 31.5–35.7)
MCV: 93 fL (ref 79–97)
Platelets: 298 10*3/uL (ref 150–450)
RBC: 4.27 x10E6/uL (ref 3.77–5.28)
RDW: 12.1 % (ref 11.7–15.4)
WBC: 15.9 10*3/uL — ABNORMAL HIGH (ref 3.4–10.8)

## 2022-12-23 LAB — TSH: TSH: 0.527 u[IU]/mL (ref 0.450–4.500)

## 2022-12-26 LAB — CBC WITH DIFFERENTIAL/PLATELET
Basophils Absolute: 0.1 10*3/uL (ref 0.0–0.2)
Basos: 1 %
EOS (ABSOLUTE): 0.4 10*3/uL (ref 0.0–0.4)
Eos: 2 %
Hematocrit: 38.6 % (ref 34.0–46.6)
Hemoglobin: 12.7 g/dL (ref 11.1–15.9)
Immature Grans (Abs): 0 10*3/uL (ref 0.0–0.1)
Immature Granulocytes: 0 %
Lymphocytes Absolute: 3.8 10*3/uL — ABNORMAL HIGH (ref 0.7–3.1)
Lymphs: 24 %
MCH: 30.7 pg (ref 26.6–33.0)
MCHC: 32.9 g/dL (ref 31.5–35.7)
MCV: 93 fL (ref 79–97)
Monocytes Absolute: 0.6 10*3/uL (ref 0.1–0.9)
Monocytes: 4 %
Neutrophils Absolute: 10.8 10*3/uL — ABNORMAL HIGH (ref 1.4–7.0)
Neutrophils: 69 %
Platelets: 310 10*3/uL (ref 150–450)
RBC: 4.14 x10E6/uL (ref 3.77–5.28)
RDW: 12.4 % (ref 11.7–15.4)
WBC: 15.7 10*3/uL — ABNORMAL HIGH (ref 3.4–10.8)

## 2022-12-26 LAB — SPECIMEN STATUS REPORT

## 2022-12-27 ENCOUNTER — Other Ambulatory Visit: Payer: Self-pay | Admitting: Family Medicine

## 2022-12-27 DIAGNOSIS — G8929 Other chronic pain: Secondary | ICD-10-CM

## 2022-12-27 MED ORDER — LIDOCAINE 5 % EX PTCH: 1.0000 | MEDICATED_PATCH | CUTANEOUS | 0 refills | Status: DC

## 2023-01-05 ENCOUNTER — Other Ambulatory Visit: Payer: Self-pay | Admitting: Family Medicine

## 2023-01-05 DIAGNOSIS — R110 Nausea: Secondary | ICD-10-CM

## 2023-01-09 ENCOUNTER — Other Ambulatory Visit: Payer: Self-pay | Admitting: Family Medicine

## 2023-01-19 ENCOUNTER — Other Ambulatory Visit: Payer: Self-pay | Admitting: Family Medicine

## 2023-01-19 DIAGNOSIS — F322 Major depressive disorder, single episode, severe without psychotic features: Secondary | ICD-10-CM

## 2023-01-19 DIAGNOSIS — K219 Gastro-esophageal reflux disease without esophagitis: Secondary | ICD-10-CM

## 2023-01-24 ENCOUNTER — Other Ambulatory Visit: Payer: Self-pay | Admitting: Family Medicine

## 2023-01-24 DIAGNOSIS — F411 Generalized anxiety disorder: Secondary | ICD-10-CM

## 2023-02-13 ENCOUNTER — Other Ambulatory Visit: Payer: Self-pay | Admitting: Family Medicine

## 2023-02-13 DIAGNOSIS — R110 Nausea: Secondary | ICD-10-CM

## 2023-02-20 ENCOUNTER — Other Ambulatory Visit: Payer: Self-pay | Admitting: Nurse Practitioner

## 2023-02-20 DIAGNOSIS — K219 Gastro-esophageal reflux disease without esophagitis: Secondary | ICD-10-CM

## 2023-02-20 MED ORDER — SUCRALFATE 1 G PO TABS: ORAL_TABLET | ORAL | 1 refills | Status: AC

## 2023-03-03 ENCOUNTER — Other Ambulatory Visit: Payer: Self-pay | Admitting: Nurse Practitioner

## 2023-03-03 DIAGNOSIS — E039 Hypothyroidism, unspecified: Secondary | ICD-10-CM

## 2023-03-10 ENCOUNTER — Other Ambulatory Visit: Payer: Self-pay | Admitting: Nurse Practitioner

## 2023-03-10 DIAGNOSIS — I10 Essential (primary) hypertension: Secondary | ICD-10-CM

## 2023-03-17 ENCOUNTER — Other Ambulatory Visit: Payer: Self-pay | Admitting: Family Medicine

## 2023-03-20 ENCOUNTER — Telehealth: Payer: Self-pay | Admitting: *Deleted

## 2023-03-20 DIAGNOSIS — Z13820 Encounter for screening for osteoporosis: Secondary | ICD-10-CM

## 2023-03-20 DIAGNOSIS — Z1231 Encounter for screening mammogram for malignant neoplasm of breast: Secondary | ICD-10-CM

## 2023-03-20 NOTE — Telephone Encounter (Signed)
 LVM informing pt these orders had been placed and she could call to schedule.

## 2023-03-20 NOTE — Telephone Encounter (Signed)
 I will put in those orders.

## 2023-03-20 NOTE — Telephone Encounter (Signed)
 Copied from CRM [id number]. Topic: Referral - Question >> Mar 17, 2023  1:04 PM Pattie Borders B wrote: Reason for CRM: Patient calling states PCP was supposed to send referrals for Mammogram and Bone Density testing, but not received. [phone]

## 2023-03-24 ENCOUNTER — Ambulatory Visit: Payer: TRICARE For Life (TFL) | Admitting: Family Medicine

## 2023-03-27 ENCOUNTER — Telehealth: Payer: Self-pay

## 2023-03-27 NOTE — Telephone Encounter (Signed)
Copied from CRM [id number]. Topic: Clinical - Prescription Issue >> Mar 27, 2023  1:00 PM Suzette B wrote: Reason for CRM: Patient's daughter [name] stated she had recently picked up a prescription for tramadol for her mother, she put the medication down on the table and not sure where the medication is, she stated she has turned the apt upside searching for the medication, and can't find it. Please call Ms. [name] the patient's daughter back at [phone], she is requesting a new refill

## 2023-03-28 ENCOUNTER — Other Ambulatory Visit: Payer: Self-pay | Admitting: Family Medicine

## 2023-03-28 MED ORDER — TRAMADOL HCL 50 MG PO TABS: 50.0000 mg | ORAL_TABLET | Freq: Four times a day (QID) | ORAL | 0 refills | Status: DC | PRN

## 2023-03-28 NOTE — Telephone Encounter (Signed)
Please let the patient know that I have sent in a new prescription to replace the old one.

## 2023-03-28 NOTE — Telephone Encounter (Signed)
Called pt daughter she is advised of the new Rx that was sent in

## 2023-04-07 ENCOUNTER — Ambulatory Visit (INDEPENDENT_AMBULATORY_CARE_PROVIDER_SITE_OTHER): Payer: TRICARE For Life (TFL) | Admitting: Family Medicine

## 2023-04-07 ENCOUNTER — Other Ambulatory Visit: Payer: Self-pay | Admitting: Family Medicine

## 2023-04-07 ENCOUNTER — Encounter: Payer: Self-pay | Admitting: Family Medicine

## 2023-04-07 VITALS — BP 119/76 | HR 73 | Ht 60.0 in | Wt 173.0 lb

## 2023-04-07 DIAGNOSIS — E1165 Type 2 diabetes mellitus with hyperglycemia: Secondary | ICD-10-CM

## 2023-04-07 DIAGNOSIS — I152 Hypertension secondary to endocrine disorders: Secondary | ICD-10-CM

## 2023-04-07 DIAGNOSIS — E1159 Type 2 diabetes mellitus with other circulatory complications: Secondary | ICD-10-CM | POA: Diagnosis not present

## 2023-04-07 DIAGNOSIS — E1169 Type 2 diabetes mellitus with other specified complication: Secondary | ICD-10-CM | POA: Diagnosis not present

## 2023-04-07 DIAGNOSIS — I10 Essential (primary) hypertension: Secondary | ICD-10-CM

## 2023-04-07 DIAGNOSIS — Z7984 Long term (current) use of oral hypoglycemic drugs: Secondary | ICD-10-CM

## 2023-04-07 DIAGNOSIS — F331 Major depressive disorder, recurrent, moderate: Secondary | ICD-10-CM

## 2023-04-07 DIAGNOSIS — D72829 Elevated white blood cell count, unspecified: Secondary | ICD-10-CM

## 2023-04-07 DIAGNOSIS — L304 Erythema intertrigo: Secondary | ICD-10-CM

## 2023-04-07 DIAGNOSIS — D1723 Benign lipomatous neoplasm of skin and subcutaneous tissue of right leg: Secondary | ICD-10-CM

## 2023-04-07 DIAGNOSIS — E785 Hyperlipidemia, unspecified: Secondary | ICD-10-CM

## 2023-04-07 DIAGNOSIS — R238 Other skin changes: Secondary | ICD-10-CM

## 2023-04-07 LAB — POCT GLYCOSYLATED HEMOGLOBIN (HGB A1C): HbA1c POC (<> result, manual entry): 7 % (ref 4.0–5.6)

## 2023-04-07 MED ORDER — NYSTATIN 100000 UNIT/GM EX OINT: 1.0000 | TOPICAL_OINTMENT | Freq: Two times a day (BID) | CUTANEOUS | 2 refills | Status: DC

## 2023-04-07 NOTE — Assessment & Plan Note (Signed)
 I had a discussed with the patient her treatment with both the beta-blocker and diltiazem.  Discussed potential side effects of these medications in combination.  She has been tolerating them for 10 years or more now.  EKG was obtained today and did not show any evidence of first second or third-degree heart block.  QTc less than 470.  For now we can keep the patient on these medications since she has tolerated them for such a long time but if she needs to come off any of her blood pressure medications would likely stop the diltiazem first.

## 2023-04-07 NOTE — Assessment & Plan Note (Signed)
 Complains of itchy scalp.  No rash seen.  Advised her to try over-the-counter ketoconazole.

## 2023-04-07 NOTE — Patient Instructions (Addendum)
 It was nice to see you today,  We addressed the following topics today: -Your A1c was 7.0.  For now we can leave her medications as they are.  We will recheck it again in 3 months. - For your anxiety and panic episodes I would recommend calling your insurance company and asking them if they can provide a list of therapists who are covered under your insurance. - For the itching on the back of your head I would recommend over-the-counter ketoconazole shampoo brand name Nizoral.  Use this daily for the first week and then if your itching has improved you can start using it twice a week. - The lump on your leg is likely a lipoma.  If it increases in size or becomes painful let us know and we can get an ultrasound of it - I am sending in a prescription for nystatin cream to put on the diaper rash.  Do this twice a day for 2 weeks.  Once it improves you can go back to using the Desitin instead. - I am getting a CBC today.  If it is elevated again I will refer you to the hematologist.  Have a great day,  Frederic Jericho, MD

## 2023-04-07 NOTE — Assessment & Plan Note (Signed)
 A1c 7.0 today.  Patient brought in urine specimen cup for UACR.  Will hold off on changing metformin and Trulicity today.  Follow-up in 3 to 4 months.

## 2023-04-07 NOTE — Assessment & Plan Note (Signed)
 Mild.  Currently using Desitin cream.  Prescribing nystatin cream.

## 2023-04-07 NOTE — Assessment & Plan Note (Signed)
 Noticed about a month ago.  Painless.  Most likely represents lipoma but if patient notices any change in size or character or increased pain she was advised to let us know and we can order an ultrasound.  Located on the right lateral thigh

## 2023-04-07 NOTE — Assessment & Plan Note (Signed)
 Patient has had a elevated white count between 12 and 20,000 since 2023.  Some of these are from the hospital in which she was sick but some of these are for routine follow-up visits.  Will repeat today and if still elevated will refer to hematology.

## 2023-04-07 NOTE — Assessment & Plan Note (Signed)
 Continue with current dose of Seroquel.  Patient still having some panic attacks.  Says she is able to close her eyes, leaned back and wait them out.  Encouraged patient to talk to her therapist regarding other potential coping techniques.

## 2023-04-07 NOTE — Progress Notes (Signed)
 Established Patient Office Visit  Subjective   Patient ID: [name], female    DOB: 1945-08-03  Age: 78 y.o. MRN: [id number]  Chief Complaint  Patient presents with   Medical Management of Chronic Issues    HPI  DM2-patient is compliant with her medication.  She says she has lost "20 pounds".  We talked about her A1c.  Agreed to hold off on any medication changes at this time.  HLD-patient taking her statin medication.  No questions or concerns regarding this.  Hypertension-we discussed the patient's blood pressure medications, specifically the prescription of diltiazem and metoprolol together.  She has been on this for at least about a decade now with no issue.  She agreed to repeat an EKG today.  Patient has complaints of "itching" in the back of her head.  This has been going on for about 6 weeks.  Also complaining of "diaper rash".  She puts Desitin on it.  This has been bothering her for about a month.  Patient was all concerned about the right leg and a lump she noticed about a month ago on it.  It is painless.  It has not changed in size.   The ASCVD Risk score (Arnett DK, et al., 2019) failed to calculate for the following reasons:   The valid total cholesterol range is 130 to 320 mg/dL  Health Maintenance Due  Topic Date Due   Diabetic kidney evaluation - Urine ACR  Never done   OPHTHALMOLOGY EXAM  06/05/2022   Medicare Annual Wellness (AWV)  03/11/2023      Objective:     BP 119/76   Pulse 73   Ht 5' (1.524 m)   Wt 173 lb (78.5 kg)   BMI 33.79 kg/m    Physical Exam General: Alert and oriented.  Accompanied by daughter. Pulmonary: No respiratory distress Skin: No rash on the scalp.  Mild inguinal erythema. MSK: Small nontender subcutaneous lump on the lateral right thigh.   Results for orders placed or performed in visit on 04/07/23  POCT HgB A1C  Result Value Ref Range   Hemoglobin A1C     HbA1c POC (<> result, manual entry) 7.0 4.0 -  5.6 %   HbA1c, POC (prediabetic range)     HbA1c, POC (controlled diabetic range)          Assessment & Plan:   Type 2 diabetes mellitus with hyperglycemia, without long-term current use of insulin (HCC) Assessment & Plan: A1c 7.0 today.  Patient brought in urine specimen cup for UACR.  Will hold off on changing metformin and Trulicity today.  Follow-up in 3 to 4 months.  Orders: -     POCT glycosylated hemoglobin (Hb A1C) -     Microalbumin / creatinine urine ratio  Depression, major, recurrent, moderate (HCC) Assessment & Plan: Continue with current dose of Seroquel.  Patient still having some panic attacks.  Says she is able to close her eyes, leaned back and wait them out.  Encouraged patient to talk to her therapist regarding other potential coping techniques.   Hypertension associated with type 2 diabetes mellitus (HCC) Assessment & Plan: I had a discussed with the patient her treatment with both the beta-blocker and diltiazem.  Discussed potential side effects of these medications in combination.  She has been tolerating them for 10 years or more now.  EKG was obtained today and did not show any evidence of first second or third-degree heart block.  QTc less than 470.  For now we can keep the patient on these medications since she has tolerated them for such a long time but if she needs to come off any of her blood pressure medications would likely stop the diltiazem first.   Hyperlipidemia associated with type 2 diabetes mellitus (HCC) Assessment & Plan: Continue with atorvastatin 40 mg.  Will get lipid panel at subsequent visits.  Tolerating well.   Leukocytosis, unspecified type Assessment & Plan: Patient has had a elevated white count between 12 and 20,000 since 2023.  Some of these are from the hospital in which she was sick but some of these are for routine follow-up visits.  Will repeat today and if still elevated will refer to hematology.  Orders: -     CBC with  Differential/Platelet  Dry scalp Assessment & Plan: Complains of itchy scalp.  No rash seen.  Advised her to try over-the-counter ketoconazole.   Lipoma of right lower extremity Assessment & Plan: Noticed about a month ago.  Painless.  Most likely represents lipoma but if patient notices any change in size or character or increased pain she was advised to let us know and we can order an ultrasound.  Located on the right lateral thigh   Intertrigo Assessment & Plan: Mild.  Currently using Desitin cream.  Prescribing nystatin cream.   Other orders -     Nystatin; Apply 1 Application topically 2 (two) times daily.  Dispense: 30 g; Refill: 2     Return in about 3 months (around 07/05/2023) for DM.    Sandre Kitty, MD

## 2023-04-07 NOTE — Assessment & Plan Note (Signed)
 Continue with atorvastatin 40 mg.  Will get lipid panel at subsequent visits.  Tolerating well.

## 2023-04-08 LAB — CBC WITH DIFFERENTIAL/PLATELET
Basophils Absolute: 0.1 10*3/uL (ref 0.0–0.2)
Basos: 1 %
EOS (ABSOLUTE): 0.2 10*3/uL (ref 0.0–0.4)
Eos: 2 %
Hematocrit: 40.6 % (ref 34.0–46.6)
Hemoglobin: 13.4 g/dL (ref 11.1–15.9)
Immature Grans (Abs): 0 10*3/uL (ref 0.0–0.1)
Immature Granulocytes: 0 %
Lymphocytes Absolute: 4.8 10*3/uL — ABNORMAL HIGH (ref 0.7–3.1)
Lymphs: 38 %
MCH: 30.2 pg (ref 26.6–33.0)
MCHC: 33 g/dL (ref 31.5–35.7)
MCV: 91 fL (ref 79–97)
Monocytes Absolute: 0.6 10*3/uL (ref 0.1–0.9)
Monocytes: 5 %
Neutrophils Absolute: 6.8 10*3/uL (ref 1.4–7.0)
Neutrophils: 54 %
Platelets: 315 10*3/uL (ref 150–450)
RBC: 4.44 x10E6/uL (ref 3.77–5.28)
RDW: 11.9 % (ref 11.7–15.4)
WBC: 12.6 10*3/uL — ABNORMAL HIGH (ref 3.4–10.8)

## 2023-04-09 LAB — MICROALBUMIN / CREATININE URINE RATIO
Creatinine, Urine: 55.9 mg/dL
Microalb/Creat Ratio: 49 mg/g{creat} — ABNORMAL HIGH (ref 0–29)
Microalbumin, Urine: 27.2 ug/mL

## 2023-04-19 ENCOUNTER — Ambulatory Visit: Payer: TRICARE For Life (TFL)

## 2023-04-25 ENCOUNTER — Other Ambulatory Visit: Payer: Self-pay | Admitting: Family Medicine

## 2023-04-25 DIAGNOSIS — E1165 Type 2 diabetes mellitus with hyperglycemia: Secondary | ICD-10-CM

## 2023-04-30 ENCOUNTER — Other Ambulatory Visit: Payer: Self-pay | Admitting: Family Medicine

## 2023-04-30 DIAGNOSIS — G4709 Other insomnia: Secondary | ICD-10-CM

## 2023-05-01 ENCOUNTER — Other Ambulatory Visit: Payer: Self-pay | Admitting: Family Medicine

## 2023-05-01 NOTE — Telephone Encounter (Signed)
 Placed in error

## 2023-05-11 ENCOUNTER — Other Ambulatory Visit: Payer: Self-pay | Admitting: *Deleted

## 2023-05-11 DIAGNOSIS — D509 Iron deficiency anemia, unspecified: Secondary | ICD-10-CM

## 2023-05-11 MED ORDER — FERROUS SULFATE 325 (65 FE) MG PO TABS: 325.0000 mg | ORAL_TABLET | Freq: Every day | ORAL | 1 refills | Status: DC

## 2023-05-16 ENCOUNTER — Ambulatory Visit
Admission: RE | Admit: 2023-05-16 | Discharge: 2023-05-16 | Disposition: A | Discharge status: Home / Self Care | Payer: TRICARE For Life (TFL) | Source: Ambulatory Visit | Attending: Family Medicine | Admitting: Family Medicine

## 2023-05-16 DIAGNOSIS — M8589 Other specified disorders of bone density and structure, multiple sites: Secondary | ICD-10-CM | POA: Insufficient documentation

## 2023-05-16 DIAGNOSIS — Z13820 Encounter for screening for osteoporosis: Secondary | ICD-10-CM | POA: Insufficient documentation

## 2023-05-16 DIAGNOSIS — Z1231 Encounter for screening mammogram for malignant neoplasm of breast: Secondary | ICD-10-CM | POA: Insufficient documentation

## 2023-05-23 ENCOUNTER — Other Ambulatory Visit: Payer: Self-pay | Admitting: Family Medicine

## 2023-05-23 DIAGNOSIS — K219 Gastro-esophageal reflux disease without esophagitis: Secondary | ICD-10-CM

## 2023-05-25 ENCOUNTER — Other Ambulatory Visit: Payer: Self-pay | Admitting: Family Medicine

## 2023-06-08 LAB — HM DIABETES EYE EXAM

## 2023-06-21 ENCOUNTER — Ambulatory Visit: Payer: Self-pay

## 2023-06-21 ENCOUNTER — Other Ambulatory Visit: Payer: Self-pay | Admitting: Family Medicine

## 2023-06-21 DIAGNOSIS — E1165 Type 2 diabetes mellitus with hyperglycemia: Secondary | ICD-10-CM

## 2023-06-21 NOTE — Telephone Encounter (Signed)
 Contacted daughter and offered a sooner appt and she declined at the time stated that she was having surgery.

## 2023-06-21 NOTE — Telephone Encounter (Signed)
 Chief Complaint: dizziness Symptoms: dizziness, weakness in the legs Frequency: "a long time" Pertinent Negatives: Patient denies CP, SOB, diaphoresis, palpitations, N/V, bleeding, H/A, one-sided weakness or numbness Disposition: [] ED /[x] Urgent Care (no appt availability in office) / [] Appointment(In office/virtual)/ []  [facility]/ [] Home Care/ [x] Refused Recommended Disposition /[] [facility]/ []  Follow-up with PCP Additional Notes: Pt spoke with pt's daughter [name]. [name] states pt has been experiencing dizzy spells and weakness in the legs. RN could hear the pt in the background assisting with answering RN's questions with the help of [name]. Pt states dizzy spells and leg weakness have been "for a long time." [name] states PCP has said it is due to a BP drop when standing. Pt states she only gets dizzy when she stands up. [name] states if the pt becomes dizzy, she has to sit back down or hold onto the wall until the feeling passes. Denies CP, SOB, palpitations, diaphoresis, N/V, bleeding, one-sided weakness or numbness, headaches, visual changes/blurry vision (although pt recently had an eye exam and prescription was changed). [name] states pt is eating and drinking well. [name] states pt takes 3 BP meds and wonders if that contributes to dizzy spells. Pt takes her BP sometimes and states "numbers are usually pretty good." Pt endorses leg weakness for a long time as well. Pt is still able to stand and walk. Leg weakness comes and goes. Pt has an appt 5/28 w/ Dr. Arabella Beach. RN advised [name] pt should be seen within 3 days and advised she go to an UC. [name] stated the pt prefers to see her own doctor and it is unlikely she will be agreeable to going to an UC. [name] states they would prefer to wait until their 5/28 appt. RN educated [name] on why seeking care now would be appropriate and she verbalized understanding. RN advised pt RN would relay these symptoms to the office for follow-up. RN advised [name]  if the pt develops SOB, CP, palpitations, diaphoresis, vomiting, bleeding, one-sided weakness, unsteady gait, or falls she should call 911. [name] verbalized understanding.   Copied From CRM [id number]. Reason for Triage: Pt's daughter called reporting that the patient has been experiencing dizziness and weak legs. Seeking assistance  Best contact: [phone] [name] (daughter)    Reason for Disposition  [1] MODERATE dizziness (e.g., interferes with normal activities) AND [2] has been evaluated by doctor (or NP/PA) for this  Answer Assessment - Initial Assessment Questions 1. DESCRIPTION: "Describe your dizziness."     "Dizzy spells" "for a long time", daughter states Dr. Arabella Beach told her this happens when she stands up and her BP drops. Daughter states pt is on 3 different BP meds, daughter wonders if that is contributing to symptoms 2. LIGHTHEADED: "Do you feel lightheaded?" (e.g., somewhat faint, woozy, weak upon standing)     Yes with getting up 3. VERTIGO: "Do you feel like either you or the room is spinning or tilting?" (i.e. vertigo)     No  4. SEVERITY: "How bad is it?"  "Do you feel like you are going to faint?" "Can you stand and walk?"   - MILD: Feels slightly dizzy, but walking normally.   - MODERATE: Feels unsteady when walking, but not falling; interferes with normal activities (e.g., school, work).   - SEVERE: Unable to walk without falling, or requires assistance to walk without falling; feels like passing out now.      "When she gets up and if she gets dizzy she has to go back  and sit down, or go back to her bed. She watches TV in bed most of the time. Or if she's in the hallway she hugs the wall until it passes" 5. ONSET:  "When did the dizziness begin?"     "A long time" 6. AGGRAVATING FACTORS: "Does anything make it worse?" (e.g., standing, change in head position)     Worse when she gets up, has been diagnosed with orthostatic hypotension per daughter. Daughter tells her  to get up slowly 7. HEART RATE: "Can you tell me your heart rate?" "How many beats in 15 seconds?"  (Note: not all patients can do this)       N/a  8. CAUSE: "What do you think is causing the dizziness?"     Blood pressure 9. RECURRENT SYMPTOM: "Have you had dizziness before?" If Yes, ask: "When was the last time?" "What happened that time?"     Yes  10. OTHER SYMPTOMS: "Do you have any other symptoms?" (e.g., fever, chest pain, vomiting, diarrhea, bleeding) Weakness in the legs "for a while". Daughter states she was hospitalized once. "She had a weird spell, her blood pressure and heart rate dropped at the same time." They kept her overnight.  Denies CP, denies palpitations, denies SOB, denies nausea. BP at home is "usually pretty good"  Weakness in legs - "it passes pretty quick", not all the time, she is still able to stand and walk. Endorses bouts of diarrhea. Denies vomiting. Uses loperamide  prn. Denies bleeding. Eating and drinking normally "sucking down water all the time." Denies H/A. Recent had an eye exam and her prescription has changed. Denies one-sided weakness  Daughter is having surgery on Friday, transports the pt  Protocols used: Dizziness - Lightheadedness-A-AH

## 2023-06-22 ENCOUNTER — Ambulatory Visit (INDEPENDENT_AMBULATORY_CARE_PROVIDER_SITE_OTHER)

## 2023-06-22 DIAGNOSIS — Z0000 Encounter for general adult medical examination without abnormal findings: Secondary | ICD-10-CM | POA: Diagnosis not present

## 2023-06-22 NOTE — Patient Instructions (Signed)
 Ms. Stavropoulos , Thank you for taking time out of your busy schedule to complete your Annual Wellness Visit with me. I enjoyed our conversation and look forward to speaking with you again next year. I, as well as your care team,  appreciate your ongoing commitment to your health goals. Please review the following plan we discussed and let me know if I can assist you in the future. Your Game plan/ To Do List    Referrals: If you haven't heard from the office you've been referred to, please reach out to them at the phone provided.  N/a Follow up Visits: Next Medicare AWV with our clinical staff: will schedule when closer to that time   Have you seen your provider in the last 6 months (3 months if uncontrolled diabetes)? Yes Next Office Visit with your provider: 07/05/2023 at 10:10  Clinician Recommendations:  Aim for 30 minutes of exercise or brisk walking, 6-8 glasses of water, and 5 servings of fruits and vegetables each day.       This is a list of the screening recommended for you and due dates:  Health Maintenance  Topic Date Due   Eye exam for diabetics  06/05/2022   COVID-19 Vaccine (9 - 2024-25 season) 05/29/2023   Yearly kidney function blood test for diabetes  09/08/2023   Flu Shot  09/08/2023   Complete foot exam   09/08/2023   Hemoglobin A1C  10/05/2023   Yearly kidney health urinalysis for diabetes  04/06/2024   Medicare Annual Wellness Visit  06/21/2024   DTaP/Tdap/Td vaccine (2 - Td or Tdap) 01/26/2031   Pneumonia Vaccine  Completed   DEXA scan (bone density measurement)  Completed   Hepatitis C Screening  Completed   Zoster (Shingles) Vaccine  Completed   HPV Vaccine  Aged Out   Meningitis B Vaccine  Aged Out   Colon Cancer Screening  Discontinued    Advanced directives: (Copy Requested) Please bring a copy of your health care power of attorney and living will to the office to be added to your chart at your convenience. You can mail to [facility] [address] [place], [place] [address] or email to [email or web address] Advance Care Planning is important because it:  [x]  Makes sure you receive the medical care that is consistent with your values, goals, and preferences  [x]  It provides guidance to your family and loved ones and reduces their decisional burden about whether or not they are making the right decisions based on your wishes.  Follow the link provided in your after visit summary or read over the paperwork we have mailed to you to help you started getting your Advance Directives in place. If you need assistance in completing these, please reach out to us  so that we can help you!  See attachments for Preventive Care and Fall Prevention Tips.

## 2023-06-22 NOTE — Progress Notes (Signed)
 Subjective:   [name] is a 78 y.o. who presents for a Medicare Wellness preventive visit.  As a reminder, Annual Wellness Visits don't include a physical exam, and some assessments may be limited, especially if this visit is performed virtually. We may recommend an in-person visit if needed.  Visit Complete: Virtual I connected with  [name] on 06/22/23 by a audio enabled telemedicine application and verified that I am speaking with the correct person using two identifiers.  Patient Location: Home  Provider Location: Home Office  I discussed the limitations of evaluation and management by telemedicine. The patient expressed understanding and agreed to proceed.  Vital Signs: Because this visit was a virtual/telehealth visit, some criteria may be missing or patient reported. Any vitals not documented were not able to be obtained and vitals that have been documented are patient reported.  VideoError- Librarian, academic were attempted between this provider and patient, however failed, due to patient having technical difficulties OR patient did not have access to video capability.  We continued and completed visit with audio only.   Persons Participating in Visit: Patient assisted by daughter.  AWV Questionnaire: No: Patient Medicare AWV questionnaire was not completed prior to this visit.  Cardiac Risk Factors include: advanced age (>53men, >16 women);diabetes mellitus;dyslipidemia;hypertension     Objective:     Today's Vitals   06/22/23 1047  PainSc: 5    There is no height or weight on file to calculate BMI.     06/22/2023   11:00 AM 07/14/2022    7:11 AM 05/09/2022   11:00 PM 05/09/2022    3:31 PM 10/28/2020    6:31 AM 04/12/2019    8:45 AM 03/04/2017    7:35 PM  Advanced Directives  Does Patient Have a Medical Advance Directive? Yes Yes No No Yes Yes No  Type of Estate agent of [place];Living will Healthcare  Power of [place];Living will   Healthcare Power of [place];Living will Healthcare Power of Attorney   Does patient want to make changes to medical advance directive?     No - Guardian declined    Copy of Healthcare Power of Attorney in Chart? No - copy requested    Yes - validated most recent copy scanned in chart (See row information)    Would patient like information on creating a medical advance directive?   No - Patient declined No - Patient declined       Current Medications (verified) Outpatient Encounter Medications as of 06/22/2023  Medication Sig   alendronate  (FOSAMAX ) 70 MG tablet Take 1 tablet (70 mg total) by mouth once a week. Take with a full glass of water on an empty stomach.   aspirin  81 MG tablet Take 1 tablet by mouth daily.   atorvastatin  (LIPITOR) 40 MG tablet Take 1 tablet (40 mg total) by mouth daily.   busPIRone  (BUSPAR ) 10 MG tablet TAKE 2 TABLETS BY MOUTH 2 TIMES DAILY. TAKE 1/2 TO 1 TABLET BY MOUTH TWICE DAILY.   citalopram  (CELEXA ) 40 MG tablet TAKE 1 TABLET BY MOUTH EVERY DAY   diltiazem  (CARDIZEM  CD) 240 MG 24 hr capsule TAKE 1 CAPSULE(240 MG) BY MOUTH DAILY   Dulaglutide  (TRULICITY ) 0.75 MG/0.5ML SOAJ INJECT 0.75 MG SUBCUTANEOUSLY ONE TIME PER WEEK   ferrous sulfate  325 (65 FE) MG tablet Take 1 tablet (325 mg total) by mouth daily with breakfast.   levothyroxine  (SYNTHROID ) 50 MCG tablet TAKE 1 TABLET BY MOUTH EVERY DAY IN THE  MORNING   lidocaine  (LIDODERM ) 5 % PLACE 1 PATCH ONTO THE SKIN DAILY. REMOVE & DISCARD PATCH WITHIN 12 HOURS OR AS DIRECTED BY MD   lisinopril  (ZESTRIL ) 20 MG tablet TAKE 1 TABLET BY MOUTH EVERY DAY   loperamide  (IMODIUM ) 2 MG capsule Take 2 capsules (4 mg total) by mouth as needed for diarrhea or loose stools.   metFORMIN  (GLUCOPHAGE ) 850 MG tablet Take 1 tablet (850 mg total) by mouth 2 (two) times daily with a meal.   metoprolol  tartrate (LOPRESSOR ) 25 MG tablet TAKE 1 TABLET BY MOUTH TWICE A DAY   naproxen  (NAPROSYN ) 375 MG tablet  Take 1 tablet (375 mg total) by mouth 2 (two) times daily as needed for moderate pain (pain score 4-6).   nystatin  ointment (MYCOSTATIN ) Apply 1 Application topically 2 (two) times daily.   pantoprazole  (PROTONIX ) 40 MG tablet TAKE 1 TABLET BY MOUTH TWICE A DAY   promethazine  (PHENERGAN ) 25 MG tablet TAKE 1 TABLET BY MOUTH TWICE A DAY AS NEEDED FOR NAUSEA OR VOMITING   QUEtiapine  (SEROQUEL ) 50 MG tablet TAKE 1 TABLET BY MOUTH 3 TIMES DAILY. MAY TAKE 1 TABLET IN AFTERNOON IF NEEDED FOR SEVERE ANXIETY. (Patient taking differently: Takes 75 mg three times daily)   sucralfate  (CARAFATE ) 1 g tablet TAKE 1 TABLET BY MOUTH FOUR TIMES A DAY AS NEEDED   traMADol  (ULTRAM ) 50 MG tablet Take 1 tablet (50 mg total) by mouth every 6 (six) hours as needed.   zolpidem  (AMBIEN ) 10 MG tablet TAKE 1 TABLET BY MOUTH AT BEDTIME AS NEEDED FOR SLEEP   COMIRNATY syringe  (Patient not taking: Reported on 06/22/2023)   No facility-administered encounter medications on file as of 06/22/2023.    Allergies (verified) Patient has no known allergies.   History: Past Medical History:  Diagnosis Date   Anxiety    Depression    Diabetes mellitus, type II (HCC)    GERD (gastroesophageal reflux disease)    HLD (hyperlipidemia)    Hypertension    Thyroid  disease    Past Surgical History:  Procedure Laterality Date   ABDOMINAL HYSTERECTOMY     BREAST EXCISIONAL BIOPSY Bilateral 80s   benign   BREAST SURGERY     CATARACT EXTRACTION W/PHACO Right 10/14/2020   Procedure: CATARACT EXTRACTION PHACO AND INTRAOCULAR LENS PLACEMENT (IOC)RIGHT  DIABETIC 5.54 00:54.6;  Surgeon: Annell Kidney, MD;  Location: [facility];  Service: Ophthalmology;  Laterality: Right;   CATARACT EXTRACTION W/PHACO Left 10/28/2020   Procedure: CATARACT EXTRACTION PHACO AND INTRAOCULAR LENS PLACEMENT (IOC) LEFT DIABETIC;  Surgeon: Annell Kidney, MD;  Location: [facility];  Service: Ophthalmology;  Laterality: Left;   8.13 01:09.5   COLONOSCOPY N/A 07/14/2022   Procedure: COLONOSCOPY;  Surgeon: Luke Salaam, MD;  Location: [facility] ENDOSCOPY;  Service: Gastroenterology;  Laterality: N/A;   COLONOSCOPY WITH PROPOFOL  N/A 04/12/2019   Procedure: COLONOSCOPY WITH PROPOFOL ;  Surgeon: Luke Salaam, MD;  Location: [facility] ENDOSCOPY;  Service: Gastroenterology;  Laterality: N/A;   ESOPHAGOGASTRODUODENOSCOPY (EGD) WITH PROPOFOL  N/A 07/14/2022   Procedure: ESOPHAGOGASTRODUODENOSCOPY (EGD) WITH PROPOFOL ;  Surgeon: Luke Salaam, MD;  Location[facility] END[facility]COPY;  Service: Gastroenterology;  Laterality: N/A;   EYE SURGERY     GIVENS CAPSULE STUDY N/A 08/31/2022   Procedure: GIVENS CAPSULE STUDY;  Surgeon: Luke Salaam, MD;  Location: [facility] ENDOSCOPY;  Service: Gastroenterology;  Laterality: N/A;   JOINT REPLACEMENT Right 2019   Family History  Problem Relation Age of Onset   Anxiety disorder Mother    Colon cancer Mother  Heart disease Father    Anxiety disorder Father    Obesity Sister    Anxiety disorder Sister    Depression Sister    Osteoporosis Sister    Breast cancer Paternal Aunt    Social History   Socioeconomic History   Marital status: Widowed    Spouse name: Not on file   Number of children: Not on file   Years of education: Not on file   Highest education level: Not on file  Occupational History   Not on file  Tobacco Use   Smoking status: Never   Smokeless tobacco: Never  Vaping Use   Vaping status: Never Used  Substance and Sexual Activity   Alcohol use: No    Alcohol/week: 0.0 standard drinks of alcohol   Drug use: Never   Sexual activity: Not Currently  Other Topics Concern   Not on file  Social History Narrative   Not on file   Social Drivers of Health   Financial Resource Strain: Low Risk  (06/22/2023)   Overall Financial Resource Strain (CARDIA)    Difficulty of Paying Living Expenses: Not hard at all  Food Insecurity: No Food Insecurity (06/22/2023)   Hunger Vital Sign    Worried About  Running Out of Food in the Last Year: Never true    Ran Out of Food in the Last Year: Never true  Transportation Needs: No Transportation Needs (06/22/2023)   PRAPARE - Administrator, Civil Service (Medical): No    Lack of Transportation (Non-Medical): No  Physical Activity: Inactive (06/22/2023)   Exercise Vital Sign    Days of Exercise per Week: 0 days    Minutes of Exercise per Session: 0 min  Stress: Stress Concern Present (06/22/2023)   [employer] of Occupational Health - Occupational Stress Questionnaire    Feeling of Stress : To some extent  Social Connections: Moderately Isolated (06/22/2023)   Social Connection and Isolation Panel [NHANES]    Frequency of Communication with Friends and Family: Three times a week    Frequency of Social Gatherings with Friends and Family: More than three times a week    Attends Religious Services: 1 to 4 times per year    Active Member of [employer] or Organizations: No    Attends Banker Meetings: Never    Marital Status: Widowed    Tobacco Counseling Counseling given: Not Answered    Clinical Intake:  Pre-visit preparation completed: Yes  Pain : 0-10 Pain Score: 5  Pain Type: Chronic pain Pain Location: Back Pain Orientation: Lower Pain Descriptors / Indicators: Aching Pain Onset: More than a month ago Pain Frequency: Constant     Nutritional Risks: None Diabetes: Yes CBG done?: No Did pt. bring in CBG monitor from home?: No  Lab Results  Component Value Date   HGBA1C 7.0 04/07/2023   HGBA1C 6.9 (H) 12/22/2022   HGBA1C 7.1 09/08/2022     How often do you need to have someone help you when you read instructions, pamphlets, or other written materials from your doctor or pharmacy?: 1 - Never  Interpreter Needed?: No  Information entered by :: NAllen LPN   Activities of Daily Living     06/22/2023   10:49 AM  In your present state of health, do you have any difficulty performing the  following activities:  Hearing? 0  Vision? 1  Comment a little bit  Difficulty concentrating or making decisions? 1  Comment slight with memory  Walking or climbing  stairs? 1  Dressing or bathing? 0  Doing errands, shopping? 1  Comment daughter goes with her  Preparing Food and eating ? N  Using the Toilet? N  In the past six months, have you accidently leaked urine? Y  Do you have problems with loss of bowel control? Y  Comment colitis once twice year  Managing your Medications? Y  Comment daughter sets up  Managing your Finances? N  Housekeeping or managing your Housekeeping? Y    Patient Care Team: Laneta Pintos, MD as PCP - General (Family Medicine)  Indicate any recent Medical Services you may have received from other than [facility] providers in the past year (date may be approximate).     Assessment:    This is a routine wellness examination for [place].  Hearing/Vision screen Hearing Screening - Comments:: Denies hearing issues Vision Screening - Comments:: Regular eye exams, [facility]   Goals Addressed             This Visit's Progress    Patient Stated       06/22/2023, denies goals       Depression Screen     06/22/2023   11:01 AM 04/07/2023   10:13 AM 12/22/2022   10:24 AM 09/08/2022   10:34 AM 06/07/2022    3:15 PM 03/10/2022    1:27 PM 03/09/2022    9:09 AM  PHQ 2/9 Scores  PHQ - 2 Score 0 4 2 2 4  0   PHQ- 9 Score  7 7 4 5 3    Exception Documentation       Other- indicate reason in comment box  Not completed       Daughter does assessment    Fall Risk     06/22/2023   11:01 AM 03/10/2022    1:28 PM 03/09/2021   11:25 AM 01/25/2021   10:43 AM 11/13/2020   10:16 AM  Fall Risk   Falls in the past year? 0 0 0 0 0  Number falls in past yr: 0 0 0 0 0  Injury with Fall? 0 0 0 0 0  Risk for fall due to : Medication side effect;Impaired mobility;Impaired balance/gait      Follow up Falls prevention discussed;Falls evaluation completed  Falls  evaluation completed Falls evaluation completed Falls evaluation completed    MEDICARE RISK AT HOME:  Medicare Risk at Home Any stairs in or around the home?: No If so, are there any without handrails?: No Home free of loose throw rugs in walkways, pet beds, electrical cords, etc?: Yes Adequate lighting in your home to reduce risk of falls?: Yes Life alert?: No Use of a cane, walker or w/c?: Yes Grab bars in the bathroom?: Yes Shower chair or bench in shower?: Yes Elevated toilet seat or a handicapped toilet?: No  TIMED UP AND GO:  Was the test performed?  No  Cognitive Function: Declined/Normal: No cognitive concerns noted by patient or family. Patient alert, oriented, able to answer questions appropriately and recall recent events. No signs of memory loss or confusion.    01/06/2020    1:58 PM 12/28/2017    3:33 PM  MMSE - Mini Mental State Exam  Orientation to time 5 5  Orientation to Place 5 5  Registration 3 3  Attention/ Calculation 5 5  Recall 3 3  Language- name 2 objects 2 2  Language- repeat 1 1  Language- follow 3 step command 3 3  Language- read & follow direction 1 1  Write a sentence 1 1  Copy design 1 0  Total score 30 29        03/10/2022    1:07 PM 01/25/2021   10:46 AM  6CIT Screen  What Year? 0 points 0 points  What month? 0 points 0 points  What time? 0 points 0 points  Count back from 20 0 points 0 points  Months in reverse 0 points 0 points  Repeat phrase 0 points 2 points  Total Score 0 points 2 points    Immunizations Immunization History  Administered Date(s) Administered   Fluad Quad(high Dose 65+) 12/07/2018, 11/25/2019   Influenza, High Dose Seasonal PF 11/18/2015, 09/26/2018, 11/28/2022   Influenza, Quadrivalent, Recombinant, Inj, Pf 10/21/2021   Influenza-Unspecified 11/07/2017, 11/26/2020   PFIZER(Purple Top)SARS-COV-2 Vaccination 04/11/2019, 05/02/2019, 11/02/2019, 06/08/2020, 11/26/2020   PNEUMOCOCCAL CONJUGATE-20 08/09/2020    Pfizer Covid-19 Vaccine Bivalent Booster 5y-11y 07/14/2021   Pfizer(Comirnaty)Fall Seasonal Vaccine 12 years and older 05/20/2022, 11/28/2022   Pneumococcal Polysaccharide-23 10/20/2017   Rsv, Bivalent, Protein Subunit Rsvpref,pf Pattricia Bores) 10/21/2021   Tdap 01/25/2021   Zoster Recombinant(Shingrix) 04/01/2020, 06/08/2020   Zoster, Live 03/30/2020    Screening Tests Health Maintenance  Topic Date Due   OPHTHALMOLOGY EXAM  06/05/2022   COVID-19 Vaccine (9 - 2024-25 season) 05/29/2023   Diabetic kidney evaluation - eGFR measurement  09/08/2023   INFLUENZA VACCINE  09/08/2023   FOOT EXAM  09/08/2023   HEMOGLOBIN A1C  10/05/2023   Diabetic kidney evaluation - Urine ACR  04/06/2024   Medicare Annual Wellness (AWV)  06/21/2024   DTaP/Tdap/Td (2 - Td or Tdap) 01/26/2031   Pneumonia Vaccine 98+ Years old  Completed   DEXA SCAN  Completed   Hepatitis C Screening  Completed   Zoster Vaccines- Shingrix  Completed   HPV VACCINES  Aged Out   Meningococcal B Vaccine  Aged Out   Colonoscopy  Discontinued    Health Maintenance  Health Maintenance Due  Topic Date Due   OPHTHALMOLOGY EXAM  06/05/2022   COVID-19 Vaccine (9 - 2024-25 season) 05/29/2023   Health Maintenance Items Addressed: Requesting eye report from [facility]  Additional Screening:  Vision Screening: Recommended annual ophthalmology exams for early detection of glaucoma and other disorders of the eye.  Dental Screening: Recommended annual dental exams for proper oral hygiene  Community Resource Referral / Chronic Care Management: CRR required this visit?  No   CCM required this visit?  No   Plan:    I have personally reviewed and noted the following in the patient's chart:   Medical and social history Use of alcohol, tobacco or illicit drugs  Current medications and supplements including opioid prescriptions. Patient is not currently taking opioid prescriptions. Functional ability and status Nutritional  status Physical activity Advanced directives List of other physicians Hospitalizations, surgeries, and ER visits in previous 12 months Vitals Screenings to include cognitive, depression, and falls Referrals and appointments  In addition, I have reviewed and discussed with patient certain preventive protocols, quality metrics, and best practice recommendations. A written personalized care plan for preventive services as well as general preventive health recommendations were provided to patient.   Areatha Beecham, LPN   5/40/9811   After Visit Summary: (Pick Up) Due to this being a telephonic visit, with patients personalized plan was offered to patient and patient has requested to Pick up at office.  Notes: Nothing significant to report at this time.

## 2023-06-25 ENCOUNTER — Other Ambulatory Visit: Payer: Self-pay | Admitting: Family Medicine

## 2023-06-25 DIAGNOSIS — E039 Hypothyroidism, unspecified: Secondary | ICD-10-CM

## 2023-06-27 ENCOUNTER — Encounter: Payer: Self-pay | Admitting: Family Medicine

## 2023-07-05 ENCOUNTER — Ambulatory Visit (INDEPENDENT_AMBULATORY_CARE_PROVIDER_SITE_OTHER): Payer: TRICARE For Life (TFL) | Admitting: Family Medicine

## 2023-07-05 ENCOUNTER — Encounter: Payer: Self-pay | Admitting: Family Medicine

## 2023-07-05 VITALS — BP 103/67 | HR 62 | Ht 60.0 in | Wt 174.1 lb

## 2023-07-05 DIAGNOSIS — E1165 Type 2 diabetes mellitus with hyperglycemia: Secondary | ICD-10-CM | POA: Diagnosis not present

## 2023-07-05 DIAGNOSIS — I152 Hypertension secondary to endocrine disorders: Secondary | ICD-10-CM

## 2023-07-05 DIAGNOSIS — E1169 Type 2 diabetes mellitus with other specified complication: Secondary | ICD-10-CM | POA: Diagnosis not present

## 2023-07-05 DIAGNOSIS — Z7984 Long term (current) use of oral hypoglycemic drugs: Secondary | ICD-10-CM

## 2023-07-05 DIAGNOSIS — D72820 Lymphocytosis (symptomatic): Secondary | ICD-10-CM

## 2023-07-05 DIAGNOSIS — E1159 Type 2 diabetes mellitus with other circulatory complications: Secondary | ICD-10-CM | POA: Diagnosis not present

## 2023-07-05 DIAGNOSIS — E785 Hyperlipidemia, unspecified: Secondary | ICD-10-CM

## 2023-07-05 DIAGNOSIS — I951 Orthostatic hypotension: Secondary | ICD-10-CM

## 2023-07-05 DIAGNOSIS — Q825 Congenital non-neoplastic nevus: Secondary | ICD-10-CM

## 2023-07-05 LAB — POCT GLYCOSYLATED HEMOGLOBIN (HGB A1C): HbA1c POC (<> result, manual entry): 7 % (ref 4.0–5.6)

## 2023-07-05 MED ORDER — DILTIAZEM HCL ER COATED BEADS 120 MG PO CP24: 120.0000 mg | ORAL_CAPSULE | Freq: Every day | ORAL | 0 refills | Status: DC

## 2023-07-05 MED ORDER — DILTIAZEM HCL 30 MG PO TABS: ORAL_TABLET | ORAL | 0 refills | Status: DC

## 2023-07-05 NOTE — Assessment & Plan Note (Signed)
 Sending in referral to heme-onc.  Looking back it appears her lymphocyte levels fluctuate between normal and elevated, but over the past 5 years they have been mildly elevated with the exception of 1 lab result.  Patient agreeable to continue further workup with hematology.

## 2023-07-05 NOTE — Assessment & Plan Note (Signed)
 Initial blood pressure was slightly elevated but repeat was lower end of normal.  Will gradually taper off diltiazem  over the next few months..  Continue metoprolol , continue lisinopril .

## 2023-07-05 NOTE — Patient Instructions (Addendum)
 It was nice to see you today,  We addressed the following topics today: -I have sent in a prescription for a dose of 120 mg of diltiazem  for you to take daily for the next month - After 1 month you will be able to pick up the next lowest dose of diltiazem  which would be 30 mg twice a day. - After doing 30 mg twice a day for a month you can decrease it to 30 mg once a day for 1 more month before I see you again. - I am sending in a referral to the hematologist for your elevated white blood cell count.  Someone should call you in the next few weeks to schedule it.   Have a great day,  Etha Henle, MD

## 2023-07-05 NOTE — Assessment & Plan Note (Signed)
 Continue current management.  A1c stable at 7.0.  After we have tapered off diltiazem  we can adjust her Trulicity , but I did not want to increase Trulicity  leading to further weight loss at the same time we are trying to taper off one of her blood pressure medicines

## 2023-07-05 NOTE — Progress Notes (Signed)
 Established Patient Office Visit  Subjective   Patient ID: [name], female    DOB: [birth year]  Age: 78 y.o. MRN: [id number]  Chief Complaint  Patient presents with   Medication Management    HPI  Subjective - Dizzy spells when standing up, occurring for about a year, present almost every time standing up, resolves within seconds after walking - No syncope or fainting - No leg swelling - Tried compression socks but found them too tight - Mole on shoulder, no known changes or concerns - Reports home BP readings around 141/85, typically lower at home than in office  Medications: metoprolol  (initially prescribed for palpitations), diltiazem  240mg  daily, lisinopril , Trulicity  weekly injection (diabetes)  PMH: Diabetes, left bundle branch block (since 2017), elevated WBC count  ROS: Denies syncope, fainting, leg swelling. Reports orthostatic dizziness.   The ASCVD Risk score (Arnett DK, et al., 2019) failed to calculate for the following reasons:   The valid total cholesterol range is 130 to 320 mg/dL  Health Maintenance Due  Topic Date Due   COVID-19 Vaccine (9 - 2024-25 season) 05/29/2023      Objective:     BP 103/67   Pulse 62   Ht 5' (1.524 m)   Wt 174 lb 1.3 oz (79 kg)   SpO2 96%   BMI 34.00 kg/m    Physical Exam General: Alert, oriented.  Accompanied by family. Pulmonary: No respiratory distress Psych: Pleasant affect.   Results for orders placed or performed in visit on 07/05/23  POCT glycosylated hemoglobin (Hb A1C)  Result Value Ref Range   Hemoglobin A1C     HbA1c POC (<> result, manual entry) 7.0 4.0 - 5.6 %   HbA1c, POC (prediabetic range)     HbA1c, POC (controlled diabetic range)          Assessment & Plan:   Type 2 diabetes mellitus with hyperglycemia, without long-term current use of insulin  [facility]) Assessment & Plan: Continue current management.  A1c stable at 7.0.  After we have tapered off diltiazem  we can adjust her  Trulicity , but I did not want to increase Trulicity  leading to further weight loss at the same time we are trying to taper off one of her blood pressure medicines  Orders: -     POCT glycosylated hemoglobin (Hb A1C)  Hypertension associated with type 2 diabetes mellitus (HCC) Assessment & Plan: Initial blood pressure was slightly elevated but repeat was lower end of normal.  Will gradually taper off diltiazem  over the next few months..  Continue metoprolol , continue lisinopril .  Orders: -     POCT glycosylated hemoglobin (Hb A1C)  Hyperlipidemia associated with type 2 diabetes mellitus (HCC) -     POCT glycosylated hemoglobin (Hb A1C)  Orthostasis Assessment & Plan: No syncopal episodes.  No vertigo.  A chronic issue that has been worse lately.  Symptoms last for a few seconds after she initially stands up, most occasions.  Likely related to diabetic effect on her autonomic nervous system.  Patient does not tolerate compression socks.  Will decrease one of her blood pressure medications although I think it is less likely that medication induced hypotension is the cause.   Vascular nevus Assessment & Plan: Small purpleish colored nevus on the right clavicle that has been there for several years.  Has not changed.  Discussed that is likely vascular nevus and not melanoma.  Offered dermatology referral if she desired further evaluation but reassured her that as long as it is not  changing we can continue to monitor without needing biopsy at this time.  Pictures in media tab.   Lymphocytosis Assessment & Plan: Sending in referral to heme-onc.  Looking back it appears her lymphocyte levels fluctuate between normal and elevated, but over the past 5 years they have been mildly elevated with the exception of 1 lab result.  Patient agreeable to continue further workup with hematology.  Orders: -     Ambulatory referral to Hematology / Oncology  Other orders -     dilTIAZem  HCl ER Coated Beads;  Take 1 capsule (120 mg total) by mouth daily.  Dispense: 30 capsule; Refill: 0 -     dilTIAZem  HCl; Take 1 tablet (30 mg total) by mouth 2 (two) times daily for 30 days, THEN 1 tablet (30 mg total) daily.  Dispense: 90 tablet; Refill: 0     Return in about 3 months (around 10/05/2023) for DM.    Laneta Pintos, MD

## 2023-07-05 NOTE — Assessment & Plan Note (Signed)
 Small purpleish colored nevus on the right clavicle that has been there for several years.  Has not changed.  Discussed that is likely vascular nevus and not melanoma.  Offered dermatology referral if she desired further evaluation but reassured her that as long as it is not changing we can continue to monitor without needing biopsy at this time.  Pictures in media tab.

## 2023-07-05 NOTE — Assessment & Plan Note (Signed)
 No syncopal episodes.  No vertigo.  A chronic issue that has been worse lately.  Symptoms last for a few seconds after she initially stands up, most occasions.  Likely related to diabetic effect on her autonomic nervous system.  Patient does not tolerate compression socks.  Will decrease one of her blood pressure medications although I think it is less likely that medication induced hypotension is the cause.

## 2023-07-20 ENCOUNTER — Telehealth: Payer: Self-pay

## 2023-07-20 NOTE — Telephone Encounter (Signed)
 Left a message to confirm appts for 6/16

## 2023-07-24 ENCOUNTER — Inpatient Hospital Stay: Attending: Hematology and Oncology | Admitting: Hematology and Oncology

## 2023-07-24 ENCOUNTER — Inpatient Hospital Stay

## 2023-07-24 VITALS — BP 129/61 | HR 87 | Temp 98.7°F | Resp 17 | Ht 60.0 in | Wt 170.1 lb

## 2023-07-24 DIAGNOSIS — Z7985 Long-term (current) use of injectable non-insulin antidiabetic drugs: Secondary | ICD-10-CM | POA: Diagnosis not present

## 2023-07-24 DIAGNOSIS — F32A Depression, unspecified: Secondary | ICD-10-CM | POA: Diagnosis not present

## 2023-07-24 DIAGNOSIS — Z7984 Long term (current) use of oral hypoglycemic drugs: Secondary | ICD-10-CM | POA: Diagnosis not present

## 2023-07-24 DIAGNOSIS — D72820 Lymphocytosis (symptomatic): Secondary | ICD-10-CM

## 2023-07-24 DIAGNOSIS — E119 Type 2 diabetes mellitus without complications: Secondary | ICD-10-CM | POA: Insufficient documentation

## 2023-07-24 DIAGNOSIS — D509 Iron deficiency anemia, unspecified: Secondary | ICD-10-CM | POA: Insufficient documentation

## 2023-07-24 DIAGNOSIS — I10 Essential (primary) hypertension: Secondary | ICD-10-CM | POA: Diagnosis not present

## 2023-07-24 DIAGNOSIS — Z79899 Other long term (current) drug therapy: Secondary | ICD-10-CM | POA: Insufficient documentation

## 2023-07-24 DIAGNOSIS — F419 Anxiety disorder, unspecified: Secondary | ICD-10-CM | POA: Diagnosis not present

## 2023-07-24 DIAGNOSIS — E785 Hyperlipidemia, unspecified: Secondary | ICD-10-CM | POA: Insufficient documentation

## 2023-07-24 LAB — CBC WITH DIFFERENTIAL/PLATELET
Abs Immature Granulocytes: 0.04 10*3/uL (ref 0.00–0.07)
Basophils Absolute: 0.1 10*3/uL (ref 0.0–0.1)
Basophils Relative: 1 %
Eosinophils Absolute: 0.2 10*3/uL (ref 0.0–0.5)
Eosinophils Relative: 1 %
HCT: 35.6 % — ABNORMAL LOW (ref 36.0–46.0)
Hemoglobin: 12.2 g/dL (ref 12.0–15.0)
Immature Granulocytes: 0 %
Lymphocytes Relative: 37 %
Lymphs Abs: 4.9 10*3/uL — ABNORMAL HIGH (ref 0.7–4.0)
MCH: 30 pg (ref 26.0–34.0)
MCHC: 34.3 g/dL (ref 30.0–36.0)
MCV: 87.7 fL (ref 80.0–100.0)
Monocytes Absolute: 0.6 10*3/uL (ref 0.1–1.0)
Monocytes Relative: 5 %
Neutro Abs: 7.6 10*3/uL (ref 1.7–7.7)
Neutrophils Relative %: 56 %
Platelets: 250 10*3/uL (ref 150–400)
RBC: 4.06 MIL/uL (ref 3.87–5.11)
RDW: 12.5 % (ref 11.5–15.5)
WBC: 13.5 10*3/uL — ABNORMAL HIGH (ref 4.0–10.5)
nRBC: 0 % (ref 0.0–0.2)

## 2023-07-24 LAB — C-REACTIVE PROTEIN: CRP: 0.5 mg/dL (ref <1.0)

## 2023-07-24 LAB — FERRITIN: Ferritin: 37 ng/mL (ref 11–307)

## 2023-07-24 NOTE — Progress Notes (Signed)
 [facility] CONSULT NOTE  Patient Care Team: Laneta Pintos, MD as PCP - General (Family Medicine)  CHIEF COMPLAINTS/PURPOSE OF CONSULTATION:  Lymphocytosis.  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 Differential includes monoclonal B cell lymphocytosis vs reactive lymphocytosis. No anemia or thrombocytopenia - Order flow cytometry, CBC, ANA and CRP today. - Schedule follow-up appointment in about a month to review results, with the option for a telephone visit.  Iron  deficiency anemia Will check CBC and ferritin today Continue oral iron  in the interim.   HISTORY OF PRESENTING ILLNESS:  [name] 78 y.o. female is here because of lymphocytosis.  Discussed the use of AI scribe software for clinical note transcription with the patient, who gave verbal consent to proceed.  History of Present Illness [name] is a 78 year old female who presents with lymphocytosis. She was referred by Dr. Arabella Beach for evaluation of her high white blood cell count.  She has a history of slightly elevated white blood cell count, specifically lymphocytosis, which has been present for several years. No new symptoms such as fever, night sweats, or unintentional weight loss. No changes in breathing, bowel habits, or urination.  She has a history of iron  deficiency and is currently taking iron  supplements. Her iron  levels were noted to be low in a recent test from July. She also has a history of a stomach ulcer.  She has been actively trying to lose weight and has successfully lost 16 pounds over the past year with the help of Trulicity  and dietary changes. She reports limited physical activity but engages in leg lifts and other exercises at home.  Her past medical history includes a knee replacement and a hysterectomy many years ago. 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 No recent  hospitalizations within the past year.  She takes Seroquel  at a dose of 75 mg (one and a half tablets) three times a day, and levothyroxine  for thyroid  management.  All other systems were reviewed with the patient and are negative.  MEDICAL HISTORY:  Past Medical History:  Diagnosis Date   Anxiety    Depression    Diabetes mellitus, type II (HCC)    GERD (gastroesophageal reflux disease)    HLD (hyperlipidemia)    Hypertension    Thyroid  disease     SURGICAL HISTORY: Past Surgical History:  Procedure Laterality Date   ABDOMINAL HYSTERECTOMY     BREAST EXCISIONAL BIOPSY Bilateral 80s   benign   BREAST SURGERY     CATARACT EXTRACTION W/PHACO Right 10/14/2020   Procedure: CATARACT EXTRACTION PHACO AND INTRAOCULAR LENS PLACEMENT (IOC)RIGHT  DIABETIC 5.54 00:54.6;  Surgeon: Annell Kidney, MD;  Location: [facility];  Service: Ophthalmology;  Laterality: Right;   CATARACT EXTRACTION W/PHACO Left 10/28/2020   Procedure: CATARACT EXTRACTION PHACO AND INTRAOCULAR LENS PLACEMENT (IOC) LEFT DIABETIC;  Surgeon: Annell Kidney, MD;  Location: [facility];  Service: Ophthalmology;  Laterality: Left;  8.13 01:09.5   COLONOSCOPY N/A 07/14/2022   Procedure: COLONOSCOPY;  Surgeon: Luke Salaam, MD;  Location: [facility] ENDOSCOPY;  Service: Gastroenterology;  Laterality: N/A;   COLONOSCOPY WITH PROPOFOL  N/A 04/12/2019   Procedure: COLONOSCOPY WITH PROPOFOL ;  Surgeon: Luke Salaam, MD;  Location: [facility] ENDOSCOPY;  Service: Gastroenterology;  Laterality: N/A;   ESOPHAGOGASTRODUODENOSCOPY (EGD) WITH PROPOFOL  N/A 07/14/2022   Procedure: ESOPHAGOGASTRODUODENOSCOPY (EGD) WITH PROPOFOL ;  Surgeon: Luke Salaam, MD;  Location: [facility] ENDOSCOPY;  Service: Gastroenterology;  Laterality: N/A;  EYE SURGERY     GIVENS CAPSULE STUDY N/A 08/31/2022   Procedure: GIVENS CAPSULE STUDY;  Surgeon: Luke Salaam, MD;  Location: [facility] ENDOSCOPY;  Service: Gastroenterology;  Laterality: N/A;   JOINT  REPLACEMENT Right 2019    SOCIAL HISTORY: Social History   Socioeconomic History   Marital status: Widowed    Spouse name: Not on file   Number of children: Not on file   Years of education: Not on file   Highest education level: Not on file  Occupational History   Not on file  Tobacco Use   Smoking status: Never   Smokeless tobacco: Never  Vaping Use   Vaping status: Never Used  Substance and Sexual Activity   Alcohol use: No    Alcohol/week: 0.0 standard drinks of alcohol   Drug use: Never   Sexual activity: Not Currently  Other Topics Concern   Not on file  Social History Narrative   Not on file   Social Drivers of Health   Financial Resource Strain: Low Risk  (06/22/2023)   Overall Financial Resource Strain (CARDIA)    Difficulty of Paying Living Expenses: Not hard at all  Food Insecurity: No Food Insecurity (06/22/2023)   Hunger Vital Sign    Worried About Running Out of Food in the Last Year: Never true    Ran Out of Food in the Last Year: Never true  Transportation Needs: No Transportation Needs (06/22/2023)   PRAPARE - Administrator, Civil Service (Medical): No    Lack of Transportation (Non-Medical): No  Physical Activity: Inactive (06/22/2023)   Exercise Vital Sign    Days of Exercise per Week: 0 days    Minutes of Exercise per Session: 0 min  Stress: Stress Concern Present (06/22/2023)   [employer] of Occupational Health - Occupational Stress Questionnaire    Feeling of Stress : To some extent  Social Connections: Moderately Isolated (06/22/2023)   Social Connection and Isolation Panel    Frequency of Communication with Friends and Family: Three times a week    Frequency of Social Gatherings with Friends and Family: More than three times a week    Attends Religious Services: 1 to 4 times per year    Active Member of [employer] or Organizations: No    Attends Banker Meetings: Never    Marital Status: Widowed  Intimate Partner  Violence: Not At Risk (06/22/2023)   Humiliation, Afraid, Rape, and Kick questionnaire    Fear of Current or Ex-Partner: No    Emotionally Abused: No    Physically Abused: No    Sexually Abused: No    FAMILY HISTORY: Family History  Problem Relation Age of Onset   Anxiety disorder Mother    Colon cancer Mother    Heart disease Father    Anxiety disorder Father    Obesity Sister    Anxiety disorder Sister    Depression Sister    Osteoporosis Sister    Breast cancer Paternal Aunt     ALLERGIES:  has no known allergies.  MEDICATIONS:  Current Outpatient Medications  Medication Sig Dispense Refill   alendronate  (FOSAMAX ) 70 MG tablet Take 1 tablet (70 mg total) by mouth once a week. Take with a full glass of water on an empty stomach. 12 tablet 3   aspirin  81 MG tablet Take 1 tablet by mouth daily.     atorvastatin  (LIPITOR) 40 MG tablet TAKE 1 TABLET BY MOUTH EVERY DAY 90 tablet 1   busPIRone  (  BUSPAR ) 10 MG tablet TAKE 2 TABLETS BY MOUTH 2 TIMES DAILY. TAKE 1/2 TO 1 TABLET BY MOUTH TWICE DAILY. 360 tablet 4   citalopram  (CELEXA ) 40 MG tablet TAKE 1 TABLET BY MOUTH EVERY DAY 90 tablet 3   COMIRNATY syringe  (Patient not taking: Reported on 07/05/2023)     diltiazem  (CARDIZEM  CD) 120 MG 24 hr capsule Take 1 capsule (120 mg total) by mouth daily. 30 capsule 0   [START ON 08/04/2023] diltiazem  (CARDIZEM ) 30 MG tablet Take 1 tablet (30 mg total) by mouth 2 (two) times daily for 30 days, THEN 1 tablet (30 mg total) daily. 90 tablet 0   Dulaglutide  (TRULICITY ) 0.75 MG/0.5ML SOAJ INJECT 0.75MG  DOSE UNDER THE SKIN ONE TIME PER WEEK 6 mL 1   ferrous sulfate  325 (65 FE) MG tablet Take 1 tablet (325 mg total) by mouth daily with breakfast. 90 tablet 1   levothyroxine  (SYNTHROID ) 50 MCG tablet TAKE 1 TABLET BY MOUTH EVERY DAY IN THE MORNING 90 tablet 1   lidocaine  (LIDODERM ) 5 % PLACE 1 PATCH ONTO THE SKIN DAILY. REMOVE & DISCARD PATCH WITHIN 12 HOURS OR AS DIRECTED BY MD 30 patch 0   lisinopril   (ZESTRIL ) 20 MG tablet TAKE 1 TABLET BY MOUTH EVERY DAY 90 tablet 3   loperamide  (IMODIUM ) 2 MG capsule Take 2 capsules (4 mg total) by mouth as needed for diarrhea or loose stools. 90 capsule 2   metFORMIN  (GLUCOPHAGE ) 850 MG tablet Take 1 tablet (850 mg total) by mouth 2 (two) times daily with a meal. 180 tablet 3   metoprolol  tartrate (LOPRESSOR ) 25 MG tablet TAKE 1 TABLET BY MOUTH TWICE A DAY 180 tablet 3   naproxen  (NAPROSYN ) 375 MG tablet Take 1 tablet (375 mg total) by mouth 2 (two) times daily as needed for moderate pain (pain score 4-6). 30 tablet 2   nystatin  ointment (MYCOSTATIN ) Apply 1 Application topically 2 (two) times daily. 30 g 2   pantoprazole  (PROTONIX ) 40 MG tablet TAKE 1 TABLET BY MOUTH TWICE A DAY 180 tablet 0   promethazine  (PHENERGAN ) 25 MG tablet TAKE 1 TABLET BY MOUTH TWICE A DAY AS NEEDED FOR NAUSEA OR VOMITING 30 tablet 0   QUEtiapine  (SEROQUEL ) 50 MG tablet TAKE 1 TABLET BY MOUTH 3 TIMES DAILY. MAY TAKE 1 TABLET IN AFTERNOON IF NEEDED FOR SEVERE ANXIETY. (Patient taking differently: No sig reported) 270 tablet 1   sucralfate  (CARAFATE ) 1 g tablet TAKE 1 TABLET BY MOUTH FOUR TIMES A DAY AS NEEDED 360 tablet 1   traMADol  (ULTRAM ) 50 MG tablet Take 1 tablet (50 mg total) by mouth every 6 (six) hours as needed. 15 tablet 0   zolpidem  (AMBIEN ) 10 MG tablet TAKE 1 TABLET BY MOUTH AT BEDTIME AS NEEDED FOR SLEEP 90 tablet 1   No current facility-administered medications for this visit.     PHYSICAL EXAMINATION: ECOG PERFORMANCE STATUS: 0 - Asymptomatic  Vitals:   07/24/23 1011  BP: 129/61  Pulse: 87  Resp: 17  Temp: 98.7 F (37.1 C)  SpO2: 97%   Filed Weights   07/24/23 1011  Weight: 170 lb 1.6 oz (77.2 kg)    GENERAL:alert, no distress and comfortable SKIN: skin color, texture, turgor are normal, no rashes or significant lesions EYES: normal, conjunctiva are pink and non-injected, sclera clear OROPHARYNX:no exudate, no erythema and lips, buccal mucosa, and  tongue normal  NECK: supple, thyroid  normal size, non-tender, without nodularity LYMPH:  no palpable lymphadenopathy in the cervical, axillary  LUNGS:  clear to auscultation and percussion with normal breathing effort HEART: regular rate & rhythm and no murmurs  ABDOMEN:abdomen soft, non-tender and normal bowel sounds Musculoskeletal:no cyanosis of digits and no clubbing  PSYCH: alert & oriented x 3 with fluent speech NEURO: no focal motor/sensory deficits  LABORATORY DATA:  I have reviewed the data as listed Lab Results  Component Value Date   WBC 12.6 (H) 04/07/2023   HGB 13.4 04/07/2023   HCT 40.6 04/07/2023   MCV 91 04/07/2023   PLT 315 04/07/2023     Chemistry      Component Value Date/Time   NA 136 09/08/2022 1040   NA 135 (L) 07/17/2013 0915   K 4.7 09/08/2022 1040   K 4.1 07/17/2013 0915   CL 97 09/08/2022 1040   CL 104 07/17/2013 0915   CO2 21 09/08/2022 1040   CO2 27 07/17/2013 0915   BUN 8 09/08/2022 1040   BUN 7 07/17/2013 0915   CREATININE 0.75 09/08/2022 1040   CREATININE 0.91 07/17/2013 0915      Component Value Date/Time   CALCIUM  9.5 09/16/2022 1016   CALCIUM  8.9 07/17/2013 0915   ALKPHOS 60 09/08/2022 1040   ALKPHOS 73 07/17/2013 0915   AST 37 09/08/2022 1040   AST 28 07/17/2013 0915   ALT 34 (H) 09/08/2022 1040   ALT 30 07/17/2013 0915   BILITOT 0.5 09/08/2022 1040   BILITOT 0.5 07/17/2013 0915       RADIOGRAPHIC STUDIES: I have personally reviewed the radiological images as listed and agreed with the findings in the report. No results found.  All questions were answered. The patient knows to call the clinic with any problems, questions or concerns. I spent 30 minutes in the care of this patient including H and P, review of records, counseling and coordination of care.     Murleen Arms, MD 07/24/2023 10:18 AM

## 2023-07-26 LAB — SURGICAL PATHOLOGY

## 2023-07-27 LAB — FLOW CYTOMETRY

## 2023-07-28 LAB — ANTINUCLEAR ANTIBODIES, IFA: ANA Ab, IFA: NEGATIVE

## 2023-07-29 ENCOUNTER — Other Ambulatory Visit: Payer: Self-pay | Admitting: Family Medicine

## 2023-08-09 ENCOUNTER — Inpatient Hospital Stay: Attending: Hematology and Oncology | Admitting: Hematology and Oncology

## 2023-08-09 ENCOUNTER — Other Ambulatory Visit: Payer: Self-pay | Admitting: Family Medicine

## 2023-08-09 DIAGNOSIS — D72820 Lymphocytosis (symptomatic): Secondary | ICD-10-CM

## 2023-08-09 NOTE — Progress Notes (Signed)
 [facility] CONSULT NOTE  Patient Care Team: Chandra Toribio POUR, MD as PCP - General (Family Medicine)  CHIEF COMPLAINTS/PURPOSE OF CONSULTATION:  Lymphocytosis.  Monoclonal B-cell lymphocytosis This is a very pleasant 78 yr old female patient with monoclonal B cell lymphocytosis who is here for follow-up.  During the last visit we have seen her for lymphocytosis and suspected monoclonal B-cell lymphocytosis as a potential differential.    Flow cytometric analysis identified a clonal B-cell population constituting 11% of lymphocytes (549 cells per microliter). The B cells are positive for CD5, CD19, CD20, CD38, CD200 and express lambda light  chains.  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   Given lymphocyte count greater than 50 per microliter, we have discussed about history, PE and labs every 6 months for two yrs, followed by annually if labs are unchanged. She expressed understanding and FU appts have been made.  HISTORY OF PRESENTING ILLNESS:  [name] 78 y.o. female is here because of lymphocytosis.  Discussed the use of AI scribe software for clinical note transcription with the patient, who gave verbal consent to proceed.  History of Present Illness [name] is a 78 year old female who presents with lymphocytosis. She is here for a telephone follow up to review these results. She and her daughter were both present at the time of my telephone visit. She once again denies any B symptoms. She has lost weight with trulicity . No recurrent infections. Daughter says mom is healthy.  MEDICAL HISTORY:  Past Medical History:  Diagnosis Date   Anxiety    Depression    Diabetes mellitus, type II (HCC)    GERD (gastroesophageal reflux disease)    HLD (hyperlipidemia)    Hypertension    Thyroid  disease     SURGICAL HISTORY: Past Surgical History:  Procedure Laterality Date   ABDOMINAL  HYSTERECTOMY     BREAST EXCISIONAL BIOPSY Bilateral 80s   benign   BREAST SURGERY     CATARACT EXTRACTION W/PHACO Right 10/14/2020   Procedure: CATARACT EXTRACTION PHACO AND INTRAOCULAR LENS PLACEMENT (IOC)RIGHT  DIABETIC 5.54 00:54.6;  Surgeon: Mittie Gaskin, MD;  Location: [facility];  Service: Ophthalmology;  Laterality: Right;   CATARACT EXTRACTION W/PHACO Left 10/28/2020   Procedure: CATARACT EXTRACTION PHACO AND INTRAOCULAR LENS PLACEMENT (IOC) LEFT DIABETIC;  Surgeon: Mittie Gaskin, MD;  Location: [facility] SURGERY [facility]TR;  Service: Ophthalmology;  Laterality: Left;  8.13 01:09.5   COLONOSCOPY N/A 07/14/2022   Procedure: COLONOSCOPY;  Surgeon: Therisa Bi, MD;  Location: [facility] ENDOSCOPY;  Service: Gastroenterology;  Laterality: N/A;   COLONOSCOPY WITH PROPOFOL  N/A 04/12/2019   Procedure: COLONOSCOPY WITH PROPOFOL ;  Surgeon: Therisa Bi, MD;  Location: [facility] ENDOSCOPY;  Service: Gastroenterology;  Laterality: N/A;   ESOPHAGOGASTRODUODENOSCOPY (EGD) WITH PROPOFOL  N/A 07/14/2022   Procedure: ESOPHAGOGASTRODUODENOSCOPY (EGD) WITH PROPOFOL ;  Surgeon: Therisa Bi, MD;  Location: [facility] ENDOSCOPY;  Service: Gastroenterology;  Laterality: N/A;   EYE SURGERY     GIVENS CAPSULE STUDY N/A 08/31/2022   Procedure: GIVENS CAPSULE STUDY;  Surgeon: Therisa Bi, MD;  Location: [facility] ENDOSCOPY;  Service: Gastroenterology;  Laterality: N/A;   JOINT REPLACEMENT Right 2019    SOCIAL HISTORY: Social History   Socioeconomic History   Marital status: Widowed    Spouse name: Not on file   Number of children: Not on file   Years of education: Not on file   Highest education level: Not on file  Occupational History   Not on file  Tobacco Use   Smoking status: Never   Smokeless tobacco: Never  Vaping Use   Vaping status: Never Used  Substance and Sexual Activity   Alcohol use: No    Alcohol/week: 0.0 standard drinks of alcohol   Drug use: Never   Sexual activity: Not Currently  Other  Topics Concern   Not on file  Social History Narrative   Not on file   Social Drivers of Health   Financial Resource Strain: Low Risk  (06/22/2023)   Overall Financial Resource Strain (CARDIA)    Difficulty of Paying Living Expenses: Not hard at all  Food Insecurity: No Food Insecurity (06/22/2023)   Hunger Vital Sign    Worried About Running Out of Food in the Last Year: Never true    Ran Out of Food in the Last Year: Never true  Transportation Needs: No Transportation Needs (06/22/2023)   PRAPARE - Administrator, Civil Service (Medical): No    Lack of Transportation (Non-Medical): No  Physical Activity: Inactive (06/22/2023)   Exercise Vital Sign    Days of Exercise per Week: 0 days    Minutes of Exercise per Session: 0 min  Stress: Stress Concern Present (06/22/2023)   [employer] of Occupational Health - Occupational Stress Questionnaire    Feeling of Stress : To some extent  Social Connections: Moderately Isolated (06/22/2023)   Social Connection and Isolation Panel    Frequency of Communication with Friends and Family: Three times a week    Frequency of Social Gatherings with Friends and Family: More than three times a week    Attends Religious Services: 1 to 4 times per year    Active Member of [employer] or Organizations: No    Attends Banker Meetings: Never    Marital Status: Widowed  Intimate Partner Violence: Not At Risk (06/22/2023)   Humiliation, Afraid, Rape, and Kick questionnaire    Fear of Current or Ex-Partner: No    Emotionally Abused: No    Physically Abused: No    Sexually Abused: No    FAMILY HISTORY: Family History  Problem Relation Age of Onset   Anxiety disorder Mother    Colon cancer Mother    Heart disease Father    Anxiety disorder Father    Obesity Sister    Anxiety disorder Sister    Depression Sister    Osteoporosis Sister    Breast cancer Paternal Aunt     ALLERGIES:  has no known allergies.  MEDICATIONS:   Current Outpatient Medications  Medication Sig Dispense Refill   alendronate  (FOSAMAX ) 70 MG tablet Take 1 tablet (70 mg total) by mouth once a week. Take with a full glass of water on an empty stomach. 12 tablet 3   aspirin  81 MG tablet Take 1 tablet by mouth daily.     atorvastatin  (LIPITOR) 40 MG tablet TAKE 1 TABLET BY MOUTH EVERY DAY 90 tablet 1   busPIRone  (BUSPAR ) 10 MG tablet TAKE 2 TABLETS BY MOUTH 2 TIMES DAILY. TAKE 1/2 TO 1 TABLET BY MOUTH TWICE DAILY. 360 tablet 4   citalopram  (CELEXA ) 40 MG tablet TAKE 1 TABLET BY MOUTH EVERY DAY 90 tablet 3   diltiazem  (CARDIZEM  CD) 120 MG 24 hr capsule TAKE 1 CAPSULE BY MOUTH EVERY DAY 30 capsule 0   diltiazem  (CARDIZEM ) 30 MG tablet Take 1 tablet (30 mg total) by mouth 2 (two) times daily for 30 days, THEN 1 tablet (30 mg total) daily. 90 tablet 0  Dulaglutide  (TRULICITY ) 0.75 MG/0.5ML SOAJ INJECT 0.75MG  DOSE UNDER THE SKIN ONE TIME PER WEEK 6 mL 1   ferrous sulfate  325 (65 FE) MG tablet Take 1 tablet (325 mg total) by mouth daily with breakfast. 90 tablet 1   levothyroxine  (SYNTHROID ) 50 MCG tablet TAKE 1 TABLET BY MOUTH EVERY DAY IN THE MORNING 90 tablet 1   lidocaine  (LIDODERM ) 5 % PLACE 1 PATCH ONTO THE SKIN DAILY. REMOVE & DISCARD PATCH WITHIN 12 HOURS OR AS DIRECTED BY MD 30 patch 0   lisinopril  (ZESTRIL ) 20 MG tablet TAKE 1 TABLET BY MOUTH EVERY DAY 90 tablet 3   loperamide  (IMODIUM ) 2 MG capsule Take 2 capsules (4 mg total) by mouth as needed for diarrhea or loose stools. 90 capsule 2   metFORMIN  (GLUCOPHAGE ) 850 MG tablet Take 1 tablet (850 mg total) by mouth 2 (two) times daily with a meal. 180 tablet 3   metoprolol  tartrate (LOPRESSOR ) 25 MG tablet TAKE 1 TABLET BY MOUTH TWICE A DAY 180 tablet 3   naproxen  (NAPROSYN ) 375 MG tablet Take 1 tablet (375 mg total) by mouth 2 (two) times daily as needed for moderate pain (pain score 4-6). 30 tablet 2   nystatin  ointment (MYCOSTATIN ) Apply 1 Application topically 2 (two) times daily. 30 g 2    pantoprazole  (PROTONIX ) 40 MG tablet TAKE 1 TABLET BY MOUTH TWICE A DAY 180 tablet 0   promethazine  (PHENERGAN ) 25 MG tablet TAKE 1 TABLET BY MOUTH TWICE A DAY AS NEEDED FOR NAUSEA OR VOMITING 30 tablet 0   QUEtiapine  (SEROQUEL ) 50 MG tablet TAKE 1 TABLET BY MOUTH 3 TIMES DAILY. MAY TAKE 1 TABLET IN AFTERNOON IF NEEDED FOR SEVERE ANXIETY. (Patient taking differently: No sig reported) 270 tablet 1   sucralfate  (CARAFATE ) 1 g tablet TAKE 1 TABLET BY MOUTH FOUR TIMES A DAY AS NEEDED 360 tablet 1   traMADol  (ULTRAM ) 50 MG tablet TAKE 1 TABLET BY MOUTH EVERY 6 HOURS AS NEEDED. 15 tablet 0   zolpidem  (AMBIEN ) 10 MG tablet TAKE 1 TABLET BY MOUTH AT BEDTIME AS NEEDED FOR SLEEP 90 tablet 1   No current facility-administered medications for this visit.     PHYSICAL EXAMINATION: ECOG PERFORMANCE STATUS: 0 - Asymptomatic  There were no vitals taken for this visit.  V/S and PE deferred, telephone visit.  LABORATORY DATA:  I have reviewed the data as listed Lab Results  Component Value Date   WBC 13.5 (H) 07/24/2023   HGB 12.2 07/24/2023   HCT 35.6 (L) 07/24/2023   MCV 87.7 07/24/2023   PLT 250 07/24/2023     Chemistry      Component Value Date/Time   NA 136 09/08/2022 1040   NA 135 (L) 07/17/2013 0915   K 4.7 09/08/2022 1040   K 4.1 07/17/2013 0915   CL 97 09/08/2022 1040   CL 104 07/17/2013 0915   CO2 21 09/08/2022 1040   CO2 27 07/17/2013 0915   BUN 8 09/08/2022 1040   BUN 7 07/17/2013 0915   CREATININE 0.75 09/08/2022 1040   CREATININE 0.91 07/17/2013 0915      Component Value Date/Time   CALCIUM  9.5 09/16/2022 1016   CALCIUM  8.9 07/17/2013 0915   ALKPHOS 60 09/08/2022 1040   ALKPHOS 73 07/17/2013 0915   AST 37 09/08/2022 1040   AST 28 07/17/2013 0915   ALT 34 (H) 09/08/2022 1040   ALT 30 07/17/2013 0915   BILITOT 0.5 09/08/2022 1040   BILITOT 0.5 07/17/2013 0915  RADIOGRAPHIC STUDIES: I have personally reviewed the radiological images as listed and agreed  with the findings in the report. No results found.  All questions were answered. The patient knows to call the clinic with any problems, questions or concerns. I spent 10 minutes in the care of this patient including H and P, review of records, counseling and coordination of care.  I connected with  [name] on 08/09/23 by a telephone application and verified that I am speaking with the correct person using two identifiers.   I discussed the limitations of evaluation and management by telemedicine. The patient expressed understanding and agreed to proceed.  Location of pt: Home  Location of provider: Clinic     Amber Stalls, MD 08/09/2023 3:02 PM

## 2023-08-09 NOTE — Assessment & Plan Note (Signed)
 This is a very pleasant 78 yr old female patient with monoclonal B cell lymphocytosis who is here for follow-up.  During the last visit we have seen her for lymphocytosis and suspected monoclonal B-cell lymphocytosis as a potential differential.    Flow cytometric analysis identified a clonal B-cell population constituting 11% of lymphocytes (549 cells per microliter). The B cells are positive for CD5, CD19, CD20, CD38, CD200 and express lambda light  chains.  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   Given lymphocyte count greater than 50 per microliter, we have discussed about history, PE and labs every 6 months for two yrs, followed by annually if labs are unchanged. She expressed understanding and FU appts have been made.

## 2023-08-18 ENCOUNTER — Other Ambulatory Visit: Payer: Self-pay | Admitting: Family Medicine

## 2023-08-18 DIAGNOSIS — K219 Gastro-esophageal reflux disease without esophagitis: Secondary | ICD-10-CM

## 2023-08-26 ENCOUNTER — Other Ambulatory Visit: Payer: Self-pay | Admitting: Family Medicine

## 2023-08-30 ENCOUNTER — Telehealth: Payer: Self-pay

## 2023-08-30 NOTE — Telephone Encounter (Signed)
 Copied from CRM [id number]. Topic: Clinical - Medication Question >> Aug 30, 2023  9:29 AM Avram MATSU wrote: Reason for CRM: Daughter Stephane Fluke is confused because the provider stated the pt was suppose to be leaning off medication diltiazem  (CARDIZEM  CD) 120 MG 24 hr capsule [[id number]] but there was a refill sent in for diltiazem  (CARDIZEM ) 30 MG tablet [[id number]] Yesterday    Please give her a call back [phone]

## 2023-08-30 NOTE — Telephone Encounter (Signed)
 Yes they can ignore the new refill.

## 2023-08-30 NOTE — Telephone Encounter (Signed)
 Called pt daughter she is advised

## 2023-08-31 ENCOUNTER — Other Ambulatory Visit: Payer: Self-pay | Admitting: Family Medicine

## 2023-08-31 ENCOUNTER — Telehealth: Payer: Self-pay

## 2023-08-31 MED ORDER — TRAMADOL HCL 50 MG PO TABS: 50.0000 mg | ORAL_TABLET | Freq: Four times a day (QID) | ORAL | 0 refills | Status: DC | PRN

## 2023-08-31 NOTE — Telephone Encounter (Signed)
 Copied from CRM [id number]. Topic: Clinical - Medication Question >> Aug 31, 2023  1:52 PM Delon DASEN wrote: Reason for CRM: daughter Stephane calling, patient had a fall a couple days ago and went to walk in clinic, they did not give any pain meds for pain, nothing broken, ribs and wrist are bruised and sore- would like to know if she can get a refill on traMADol  (ULTRAM ) 50 MG tablet- please call [phone]

## 2023-08-31 NOTE — Telephone Encounter (Signed)
 I have sent in the tramadol  refill

## 2023-09-11 ENCOUNTER — Other Ambulatory Visit: Payer: Self-pay | Admitting: Family Medicine

## 2023-09-27 ENCOUNTER — Other Ambulatory Visit: Payer: Self-pay | Admitting: *Deleted

## 2023-09-27 DIAGNOSIS — I152 Hypertension secondary to endocrine disorders: Secondary | ICD-10-CM

## 2023-09-27 DIAGNOSIS — E1165 Type 2 diabetes mellitus with hyperglycemia: Secondary | ICD-10-CM

## 2023-09-27 DIAGNOSIS — E1169 Type 2 diabetes mellitus with other specified complication: Secondary | ICD-10-CM

## 2023-09-28 ENCOUNTER — Other Ambulatory Visit

## 2023-09-28 DIAGNOSIS — E1165 Type 2 diabetes mellitus with hyperglycemia: Secondary | ICD-10-CM

## 2023-09-28 DIAGNOSIS — I152 Hypertension secondary to endocrine disorders: Secondary | ICD-10-CM

## 2023-09-28 DIAGNOSIS — E1169 Type 2 diabetes mellitus with other specified complication: Secondary | ICD-10-CM

## 2023-09-29 ENCOUNTER — Ambulatory Visit: Payer: Self-pay | Admitting: Family Medicine

## 2023-09-29 LAB — CBC WITH DIFFERENTIAL/PLATELET
Basophils Absolute: 0.1 x10E3/uL (ref 0.0–0.2)
Basos: 1 %
EOS (ABSOLUTE): 0.2 x10E3/uL (ref 0.0–0.4)
Eos: 2 %
Hematocrit: 40.7 % (ref 34.0–46.6)
Hemoglobin: 12.9 g/dL (ref 11.1–15.9)
Immature Grans (Abs): 0 x10E3/uL (ref 0.0–0.1)
Immature Granulocytes: 0 %
Lymphocytes Absolute: 4 x10E3/uL — ABNORMAL HIGH (ref 0.7–3.1)
Lymphs: 41 %
MCH: 29.5 pg (ref 26.6–33.0)
MCHC: 31.7 g/dL (ref 31.5–35.7)
MCV: 93 fL (ref 79–97)
Monocytes Absolute: 0.6 x10E3/uL (ref 0.1–0.9)
Monocytes: 6 %
Neutrophils Absolute: 5 x10E3/uL (ref 1.4–7.0)
Neutrophils: 50 %
Platelets: 243 x10E3/uL (ref 150–450)
RBC: 4.38 x10E6/uL (ref 3.77–5.28)
RDW: 12.6 % (ref 11.7–15.4)
WBC: 9.9 x10E3/uL (ref 3.4–10.8)

## 2023-09-29 LAB — LIPID PANEL
Chol/HDL Ratio: 2.8 ratio (ref 0.0–4.4)
Cholesterol, Total: 92 mg/dL — ABNORMAL LOW (ref 100–199)
HDL: 33 mg/dL — ABNORMAL LOW (ref >39)
LDL Chol Calc (NIH): 40 mg/dL (ref 0–99)
Triglycerides: 99 mg/dL (ref 0–149)
VLDL Cholesterol Cal: 19 mg/dL (ref 5–40)

## 2023-09-29 LAB — COMPREHENSIVE METABOLIC PANEL WITH GFR
ALT: 17 IU/L (ref 0–32)
AST: 17 IU/L (ref 0–40)
Albumin: 4.5 g/dL (ref 3.8–4.8)
Alkaline Phosphatase: 56 IU/L (ref 44–121)
BUN/Creatinine Ratio: 9 — ABNORMAL LOW (ref 12–28)
BUN: 6 mg/dL — ABNORMAL LOW (ref 8–27)
Bilirubin Total: 0.4 mg/dL (ref 0.0–1.2)
CO2: 20 mmol/L (ref 20–29)
Calcium: 9.8 mg/dL (ref 8.7–10.3)
Chloride: 98 mmol/L (ref 96–106)
Creatinine, Ser: 0.67 mg/dL (ref 0.57–1.00)
Globulin, Total: 1.7 g/dL (ref 1.5–4.5)
Glucose: 131 mg/dL — ABNORMAL HIGH (ref 70–99)
Potassium: 4.5 mmol/L (ref 3.5–5.2)
Sodium: 137 mmol/L (ref 134–144)
Total Protein: 6.2 g/dL (ref 6.0–8.5)
eGFR: 89 mL/min/1.73 (ref >59)

## 2023-09-29 LAB — HEMOGLOBIN A1C
Est. average glucose Bld gHb Est-mCnc: 143 mg/dL
Hgb A1c MFr Bld: 6.6 % — ABNORMAL HIGH (ref 4.8–5.6)

## 2023-09-29 LAB — MICROALBUMIN / CREATININE URINE RATIO
Creatinine, Urine: 24.3 mg/dL
Microalb/Creat Ratio: <12 mg/g{creat} (ref 0–29)
Microalbumin, Urine: <3 ug/mL

## 2023-10-05 ENCOUNTER — Ambulatory Visit (INDEPENDENT_AMBULATORY_CARE_PROVIDER_SITE_OTHER): Admitting: Family Medicine

## 2023-10-05 ENCOUNTER — Encounter: Payer: Self-pay | Admitting: Family Medicine

## 2023-10-05 VITALS — BP 137/66 | HR 74 | Ht 60.0 in | Wt 168.4 lb

## 2023-10-05 DIAGNOSIS — E039 Hypothyroidism, unspecified: Secondary | ICD-10-CM | POA: Diagnosis not present

## 2023-10-05 DIAGNOSIS — E1165 Type 2 diabetes mellitus with hyperglycemia: Secondary | ICD-10-CM

## 2023-10-05 DIAGNOSIS — Z7984 Long term (current) use of oral hypoglycemic drugs: Secondary | ICD-10-CM

## 2023-10-05 DIAGNOSIS — I152 Hypertension secondary to endocrine disorders: Secondary | ICD-10-CM | POA: Diagnosis not present

## 2023-10-05 DIAGNOSIS — E1159 Type 2 diabetes mellitus with other circulatory complications: Secondary | ICD-10-CM

## 2023-10-05 DIAGNOSIS — C9110 Chronic lymphocytic leukemia of B-cell type not having achieved remission: Secondary | ICD-10-CM | POA: Diagnosis not present

## 2023-10-05 DIAGNOSIS — G4709 Other insomnia: Secondary | ICD-10-CM

## 2023-10-05 NOTE — Assessment & Plan Note (Signed)
-   Blood sugars appear well-controlled on current Trulicity  dose. - Continue current Trulicity  dose. No changes at this time. - Diabetic foot exam performed today, findings normal.

## 2023-10-05 NOTE — Assessment & Plan Note (Signed)
-   Currently on Synthroid  50 mcg daily. Last TSH level not available. - Order TSH level today.

## 2023-10-05 NOTE — Assessment & Plan Note (Signed)
-   Blood pressure is controlled. BP today 137/66. Reports home BPs around 140/80. Currently taking lisinopril  and metoprolol . Tapering off diltiazem . Dizziness is a potential side effect of antihypertensives. - Continue lisinopril . - Continue metoprolol  12.5 mg twice daily.  - Complete diltiazem  taper, last dose tomorrow. - Re-evaluate need for all antihypertensives at next visit in 3 months.

## 2023-10-05 NOTE — Patient Instructions (Addendum)
 It was nice to see you today,  We addressed the following topics today: -Please let me know if you are metoprolol  tablets are 50 mg tablets or 25 mg tablets. - At your next visit we can discuss if you would like to stop taking the metoprolol  if your blood pressure is still good. - Your A1c was good.  No changes to your diabetes medications today. - We need to check your thyroid  level.  We will get that today and send you message about the results.  Have a great day,  Rolan Slain, MD

## 2023-10-05 NOTE — Assessment & Plan Note (Signed)
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 Takes Ambien  10 mg nightly for sleep. - Discussed that the recommended safe dose is 5 mg. Advised to consider a trial of 5 mg in the future, but no changes made today.

## 2023-10-05 NOTE — Progress Notes (Signed)
 Established Patient Office Visit  Subjective   Patient ID: [name], female    DOB: Nov 23, 1945  Age: 78 y.o. MRN: [id number]  Chief Complaint  Patient presents with   Medical Management of Chronic Issues    HPI  Subjective - Follow-up visit. Reports no new issues or concerns. - Follow-up regarding CLL, B-cell predominant type, as diagnosed by hematology. Currently being monitored. - Recent fall approximately one month ago. Reports sleepwalking and tripped over an object, falling onto a countertop. Sustained injury to ribs and left forearm. Seen at an urgent care clinic. X-rays were negative for fracture. Ribs remain painful but improved. No pain on deep inspiration or palpation now. - Reports ongoing dizzy episodes, unchanged in frequency. - History of sleepwalking since childhood.  Medications: Currently tapering off diltiazem  with last dose on Friday. Continues buspirone , citalopram , Trulicity , lisinopril , iron , and Synthroid . Takes half tablet of metoprolol  25 mg twice daily,. Takes Ambien  for sleep nightly.  PMH, PSH, FH, Social Hx: PMHx: Chronic Lymphocytic Leukemia (CLL), B-cell predominant type. Hypertension. Diabetes. Hypothyroidism. History of chest fluttering. PSH: None mentioned. FH: None mentioned. Social Hx: Lifelong history of somnambulism (sleepwalking).  ROS: Cardiovascular: Reports occasional fluttering sensation in the chest. Denies chest pain. Neurological: Reports ongoing dizziness. Musculoskeletal: Reports residual rib pain after a fall, but improving. Denies pain with movement or palpation. Psychiatric: Reports inability to sleep without Ambien . Lifelong history of sleepwalking.     The ASCVD Risk score (Arnett DK, et al., 2019) failed to calculate for the following reasons:   The valid total cholesterol range is 130 to 320 mg/dL  Health Maintenance Due  Topic Date Due   COVID-19 Vaccine (9 - Pfizer risk 2024-25 season) 05/29/2023    INFLUENZA VACCINE  09/08/2023   FOOT EXAM  09/08/2023      Objective:     BP 137/66   Pulse 74   Ht 5' (1.524 m)   Wt 168 lb 6.4 oz (76.4 kg)   SpO2 95%   BMI 32.89 kg/m    Physical Exam General: No acute distress. CV: Regular rate and rhythm. PULM: Lungs clear to auscultation bilaterally. No pain with deep inspiration. MSK: Left ribs non-tender to palpation. Full range of motion of upper extremities without pain. EXTREMITIES: Diabetic foot exam performed. Sensation intact to monofilament testing bilaterally on feet.   No results found for any visits on 10/05/23.      Assessment & Plan:   Hypothyroidism, unspecified type Assessment & Plan: - Currently on Synthroid  50 mcg daily. Last TSH level not available. - Order TSH level today.  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  - Continue to monitor as per hematology recommendations.   Other insomnia Assessment & Plan: - Reports lifelong history of sleepwalking. Takes Ambien  10 mg nightly for sleep. - Discussed that the recommended safe dose is 5 mg. Advised to consider a trial of 5 mg in the future, but no changes made today.   Type 2 diabetes mellitus with hyperglycemia, without long-term current use of insulin  (HCC) Assessment & Plan: - Blood sugars appear well-controlled on current Trulicity  dose. - Continue current Trulicity  dose. No changes at this time. - Diabetic foot exam performed today, findings normal.   Hypertension associated with type 2 diabetes mellitus (HCC) Assessment & Plan: - Blood pressure is controlled. BP today 137/66. Reports home BPs around  140/80. Currently taking lisinopril  and metoprolol . Tapering off diltiazem . Dizziness is a potential side effect of antihypertensives. - Continue lisinopril . - Continue metoprolol  12.5 mg twice daily.   - Complete diltiazem  taper, last dose tomorrow. - Re-evaluate need for all antihypertensives at next visit in 3 months.      Return in about 3 months (around 01/05/2024) for DM, HTN.    Toribio MARLA Slain, MD

## 2023-10-05 NOTE — Assessment & Plan Note (Signed)
-   Diagnosed after hematology referral for chronic elevated lymphocytes.  - Continue to monitor as per hematology recommendations.

## 2023-10-06 ENCOUNTER — Ambulatory Visit: Payer: Self-pay | Admitting: Family Medicine

## 2023-10-06 LAB — TSH: TSH: 1.52 u[IU]/mL (ref 0.450–4.500)

## 2023-10-18 ENCOUNTER — Telehealth: Payer: Self-pay

## 2023-10-18 ENCOUNTER — Other Ambulatory Visit: Payer: Self-pay | Admitting: Family Medicine

## 2023-10-18 DIAGNOSIS — G4709 Other insomnia: Secondary | ICD-10-CM

## 2023-10-18 NOTE — Telephone Encounter (Signed)
 Copied from CRM [id number]. Topic: Clinical - Medication Question >> Oct 18, 2023  4:16 PM Delon DASEN wrote: Reason for CRM: Patient needs prescription for Covid vaccine

## 2023-10-19 NOTE — Telephone Encounter (Signed)
 They have Pfizer with Rx.

## 2023-10-20 ENCOUNTER — Other Ambulatory Visit: Payer: Self-pay | Admitting: Family Medicine

## 2023-10-20 MED ORDER — PFIZER COVID-19 VAC BIVALENT 30 MCG/0.3ML IM SUSP: 0.3000 mL | Freq: Once | INTRAMUSCULAR | 0 refills | Status: AC

## 2023-10-21 ENCOUNTER — Other Ambulatory Visit: Payer: Self-pay | Admitting: Family Medicine

## 2023-10-21 DIAGNOSIS — M810 Age-related osteoporosis without current pathological fracture: Secondary | ICD-10-CM

## 2023-10-27 ENCOUNTER — Other Ambulatory Visit: Payer: Self-pay | Admitting: Family Medicine

## 2023-11-03 ENCOUNTER — Other Ambulatory Visit: Payer: Self-pay | Admitting: Family Medicine

## 2023-11-03 DIAGNOSIS — E1165 Type 2 diabetes mellitus with hyperglycemia: Secondary | ICD-10-CM

## 2023-11-08 ENCOUNTER — Encounter: Payer: Self-pay | Admitting: Emergency Medicine

## 2023-11-08 ENCOUNTER — Ambulatory Visit
Admission: EM | Admit: 2023-11-08 | Discharge: 2023-11-08 | Disposition: A | Discharge status: Home / Self Care | Attending: Emergency Medicine | Admitting: Emergency Medicine

## 2023-11-08 DIAGNOSIS — R300 Dysuria: Secondary | ICD-10-CM | POA: Diagnosis not present

## 2023-11-08 DIAGNOSIS — N3000 Acute cystitis without hematuria: Secondary | ICD-10-CM

## 2023-11-08 LAB — POCT URINE DIPSTICK
Bilirubin, UA: NEGATIVE
Blood, UA: NEGATIVE
Glucose, UA: 100 mg/dL — AB
Leukocytes, UA: NEGATIVE
Nitrite, UA: POSITIVE — AB
Spec Grav, UA: <=1.005 — AB (ref 1.010–1.025)
Urobilinogen, UA: 1 U/dL
pH, UA: 5.5 (ref 5.0–8.0)

## 2023-11-08 MED ORDER — NITROFURANTOIN MONOHYD MACRO 100 MG PO CAPS: 100.0000 mg | ORAL_CAPSULE | Freq: Two times a day (BID) | ORAL | 0 refills | Status: DC

## 2023-11-08 NOTE — ED Triage Notes (Signed)
 Patient reports burning sensation and painful urination x 1 day. Patient reports she has a history of frequent UTI's. Patient took AZO with mild relief.

## 2023-11-08 NOTE — Discharge Instructions (Signed)
Your urinalysis shows [name] blood cells and nitrates which are indicative of infection, your urine will be sent to the lab to determine exactly which bacteria is present, if any changes need to be made to your medications you will be notified  Begin use of Macrobid twice daily for 5 days   You may use over-the-counter Azo to help minimize your symptoms until antibiotic removes bacteria, this medication will turn your urine orange  Increase your fluid intake through use of water  As always practice good hygiene, wiping front to back and avoidance of scented vaginal products to prevent further irritation  If symptoms continue to persist after use of medication or recur please follow-up with urgent care or your primary doctor as needed  

## 2023-11-13 ENCOUNTER — Other Ambulatory Visit: Payer: TRICARE For Life (TFL)

## 2023-11-13 NOTE — ED Provider Notes (Signed)
 [name]    CSN: [id number] Arrival date & time: 11/08/23  1343      History   Chief Complaint Chief Complaint  Patient presents with   Dysuria    HPI [name] is a 78 y.o. female.   Patient presents for evaluation of dysuria beginning 1 day ago.  Has attempted use of Azo with minimal improvement.  Denies urinary frequency, hematuria, fever, abdominal or flank pain, vaginal symptoms.  Endorses history of reoccurring UTI.  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 cycle 06/14/2022   Metabolic syndrome 06/14/2022   At risk for fall due to comorbid condition 05/09/2022   Gait instability 05/09/2022   Bradycardia 05/09/2022   Chronic lymphocytic leukemia (CLL), B-cell (HCC) 05/09/2022   Cerebral infarction (HCC) 05/09/2022   Elevated liver enzymes 04/27/2021   Irritable bowel syndrome with diarrhea 01/27/2021   Peripheral edema 11/22/2020   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 08/25/2020   Hyperlipidemia associated with type 2 diabetes mellitus (HCC) 05/25/2020   Age-related cataract 12/29/2019   Mitral valve prolapse 03/25/2019   Gastroesophageal reflux disease without esophagitis 03/25/2019   Acquired absence of genital organ 02/27/2019   Allergic rhinitis 02/27/2019   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   Palpitations 02/27/2019   Restless leg syndrome  10/05/2018   Age related osteoporosis 08/21/2018   Eczema of external ear 08/21/2018   Encounter for general adult medical examination with abnormal findings 12/30/2017   Fatigue due to sleep pattern disturbance 12/30/2017   Ovarian failure 09/27/2017   Chronic right-sided low back pain with right-sided sciatica 09/27/2017   Type 2 diabetes mellitus with hyperglycemia, without long-term current use of insulin  (HCC) 08/05/2017   Hypothyroidism 08/05/2017   Hypertension associated with type 2 diabetes mellitus (HCC) 08/05/2017   Body mass index (BMI) of 35.0-35.9 in adult 04/07/2017   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   Primary osteoarthritis of right knee 07/28/2016   Depression, major, recurrent, moderate (HCC) 07/22/2014   Anxiety, generalized 07/22/2014   Other insomnia 07/22/2014   Panic disorder without agoraphobia 07/22/2014    Past Surgical History:  Procedure Laterality Date   ABDOMINAL HYSTERECTOMY     BREAST EXCISIONAL BIOPSY Bilateral 80s   benign   BREAST SURGERY     CATARACT EXTRACTION W/PHACO Right 10/14/2020   Procedure: CATARACT EXTRACTION PHACO AND INTRAOCULAR LENS PLACEMENT (IOC)RIGHT  DIABETIC 5.54 00:54.6;  Surgeon: Mittie Gaskin, MD;  Location: [facility];  Service: Ophthalmology;  Laterality: Right;   CATARACT EXTRACTION W/PHACO Left 10/28/2020   Procedure: CATARACT EXTRACTION PHACO AND INTRAOCULAR LENS PLACEMENT (IOC) LEFT DIABETIC;  Surgeon: Mittie Gaskin, MD;  Location: [facility];  Service: Ophthalmology;  Laterality: Left;  8.13 01:09.5   COLONOSCOPY N/A 07/14/2022   Procedure: COLONOSCOPY;  Surgeon: Therisa Bi, MD;  Location: [facility] ENDOSCOPY;  Service: Gastroenterology;  Laterality: N/A;   COLONOSCOPY WITH  PROPOFOL  N/A 04/12/2019   Procedure: COLONOSCOPY WITH PROPOFOL ;  Surgeon: Therisa Bi, MD;  Location: [facility] END[facility];  Service: Gastroenterology;  Laterality: N/A;   ESOPHAGOGASTRODUODENOSCOPY  (EGD) WITH PROPOFOL  N/A 07/14/2022   Procedure: ESOPHAGOGASTRODUODENOSCOPY (EGD) WITH PROPOFOL ;  Surgeon: Therisa Bi, MD;  Location: [facility] ENDOSCOPY;  Service: Gastroenterology;  Laterality: N/A;   EYE SURGERY     GIVENS CAPSULE STUDY N/A 08/31/2022   Procedure: GIVENS CAPSULE STUDY;  Surgeon: Therisa Bi, MD;  Location: [facility] ENDOSCOPY;  Service: Gastroenterology;  Laterality: N/A;   JOINT REPLACEMENT Right 2019    OB History   No obstetric history on file.      Home Medications    Prior to Admission medications   Medication Sig Start Date End Date Taking? Authorizing Provider  nitrofurantoin, macrocrystal-monohydrate, (MACROBID) 100 MG capsule Take 1 capsule (100 mg total) by mouth 2 (two) times daily. 11/08/23  Yes [name], [name]  alendronate  (FOSAMAX ) 70 MG tablet TAKE 1 TABLET (70 MG TOTAL) BY MOUTH EVERY 7 DAYS WITH FULL GLASS WATER ON EMPTY STOMACH 10/23/23   Chandra Toribio POUR, MD  aspirin  81 MG tablet Take 1 tablet by mouth daily.    [provider]  atorvastatin  (LIPITOR) 40 MG tablet TAKE 1 TABLET BY MOUTH EVERY DAY 06/26/23   Chandra Toribio POUR, MD  busPIRone  (BUSPAR ) 10 MG tablet TAKE 2 TABLETS BY MOUTH 2 TIMES DAILY. TAKE 1/2 TO 1 TABLET BY MOUTH TWICE DAILY. 01/24/23   Chandra Toribio POUR, MD  citalopram  (CELEXA ) 40 MG tablet TAKE 1 TABLET BY MOUTH EVERY DAY 12/22/22   Chandra Toribio POUR, MD  diltiazem  (CARDIZEM  CD) 120 MG 24 hr capsule TAKE 1 CAPSULE BY MOUTH EVERY DAY 07/31/23   Chandra Toribio POUR, MD  diltiazem  (CARDIZEM ) 30 MG tablet TAKE 1 TABLET (30 MG TOTAL) 2 (TWO) TIMES DAILY FOR 30 DAYS, THEN 1 TABLET (30 MG TOTAL) DAILY. 08/28/23   Chandra Toribio POUR, MD  Dulaglutide  (TRULICITY ) 0.75 MG/0.5ML SOAJ INJECT 0.75MG  DOSE UNDER THE SKIN ONE TIME PER WEEK 06/22/23   Chandra Toribio POUR, MD  ferrous sulfate  325 (65 FE) MG tablet Take 1 tablet (325 mg total) by mouth daily with breakfast. 05/11/23   Chandra Toribio POUR, MD  levothyroxine  (SYNTHROID ) 50 MCG tablet TAKE 1 TABLET BY MOUTH EVERY  DAY IN THE MORNING 06/26/23   Chandra Toribio POUR, MD  lidocaine  (LIDODERM ) 5 % PLACE 1 PATCH ONTO THE SKIN DAILY. REMOVE & DISCARD PATCH WITHIN 12 HOURS OR AS DIRECTED BY MD 05/29/23   Chandra Toribio POUR, MD  lisinopril  (ZESTRIL ) 20 MG tablet TAKE 1 TABLET BY MOUTH EVERY DAY 04/07/23   Chandra Toribio POUR, MD  loperamide  (IMODIUM ) 2 MG capsule Take 2 capsules (4 mg total) by mouth as needed for diarrhea or loose stools. 12/22/22   Chandra Toribio POUR, MD  metFORMIN  (GLUCOPHAGE ) 850 MG tablet TAKE 1 TABLET (850 MG TOTAL) BY MOUTH 2 (TWO) TIMES DAILY WITH A MEAL. 11/03/23   Chandra Toribio POUR, MD  metoprolol  tartrate (LOPRESSOR ) 25 MG tablet TAKE 1 TABLET BY MOUTH TWICE A DAY 03/10/23   Chandra Toribio POUR, MD  naproxen  (NAPROSYN ) 375 MG tablet Take 1 tablet (375 mg total) by mouth 2 (two) times daily as needed for moderate pain (pain score 4-6). 12/22/22   Chandra Toribio POUR, MD  nystatin  ointment (MYCOSTATIN ) Apply 1 Application topically 2 (two) times daily. 04/07/23   Chandra Toribio POUR, MD  pantoprazole  (PROTONIX ) 40 MG tablet TAKE 1 TABLET BY MOUTH TWICE A DAY  08/21/23   Chandra Toribio POUR, MD  promethazine  (PHENERGAN ) 25 MG tablet TAKE 1 TABLET BY MOUTH TWICE A DAY AS NEEDED FOR NAUSEA OR VOMITING 02/13/23   Chandra Toribio POUR, MD  QUEtiapine  (SEROQUEL ) 50 MG tablet TAKE 1 TABLET BY MOUTH 3 TIMES DAILY. MAY TAKE 1 TABLET IN AFTERNOON IF NEEDED FOR SEVERE ANXIETY. Patient taking differently: No sig reported 01/19/23   Chandra Toribio POUR, MD  sucralfate  (CARAFATE ) 1 g tablet TAKE 1 TABLET BY MOUTH FOUR TIMES A DAY AS NEEDED 02/20/23   Chandra Toribio POUR, MD  traMADol  (ULTRAM ) 50 MG tablet TAKE 1 TABLET BY MOUTH EVERY 6 HOURS AS NEEDED 10/30/23   Chandra Toribio POUR, MD  zolpidem  (AMBIEN ) 10 MG tablet TAKE 1 TABLET BY MOUTH EVERY DAY AT BEDTIME AS NEEDED FOR SLEEP 10/19/23   Chandra Toribio POUR, MD    Family History Family History  Problem Relation Age of Onset   Anxiety disorder Mother    Colon cancer Mother    Heart disease Father    Anxiety  disorder Father    Obesity Sister    Anxiety disorder Sister    Depression Sister    Osteoporosis Sister    Breast cancer Paternal Aunt     Social History Social History   Tobacco Use   Smoking status: Never   Smokeless tobacco: Never  Vaping Use   Vaping status: Never Used  Substance Use Topics   Alcohol use: No    Alcohol/week: 0.0 standard drinks of alcohol   Drug use: Never     Allergies   Patient has no known allergies.   Review of Systems Review of Systems  Genitourinary:  Positive for dysuria.     Physical Exam Triage Vital Signs ED Triage Vitals  Encounter Vitals Group     BP 11/08/23 1356 120/81     Girls Systolic BP Percentile --      Girls Diastolic BP Percentile --      Boys Systolic BP Percentile --      Boys Diastolic BP Percentile --      Pulse Rate 11/08/23 1356 82     Resp 11/08/23 1356 20     Temp 11/08/23 1356 97.9 F (36.6 C)     Temp Source 11/08/23 1356 Oral     SpO2 11/08/23 1356 98 %     Weight --      Height --      Head Circumference --      Peak Flow --      Pain Score 11/08/23 1354 0     Pain Loc --      Pain Education --      Exclude from Growth Chart --    No data found.  Updated Vital Signs BP 120/81 (BP Location: Right Arm)   Pulse 82   Temp 97.9 F (36.6 C) (Oral)   Resp 20   SpO2 98%   Visual Acuity Right Eye Distance:   Left Eye Distance:   Bilateral Distance:    Right Eye Near:   Left Eye Near:    Bilateral Near:     Physical Exam Constitutional:      Appearance: Normal appearance.  Eyes:     Extraocular Movements: Extraocular movements intact.  Pulmonary:     Effort: Pulmonary effort is normal.  Abdominal:     Tenderness: There is no right CVA tenderness or left CVA tenderness.  Neurological:     Mental Status: She is alert and oriented to  person, place, and time. Mental status is at baseline.      UC Treatments / Results  Labs (all labs ordered are listed, but only abnormal results are  displayed) Labs Reviewed  POCT URINE DIPSTICK - Abnormal; Notable for the following components:      Result Value   Color, UA orange (*)    Glucose, UA =100 (*)    Ketones, POC UA trace (5) (*)    Spec Grav, UA <=1.005 (*)    Nitrite, UA Positive (*)    All other components within normal limits  URINE CULTURE    EKG   Radiology No results found.  Procedures Procedures (including critical care time)  Medications Ordered in UC Medications - No data to display  Initial Impression / Assessment and Plan / UC Course  I have reviewed the triage vital signs and the nursing notes.  Pertinent labs & imaging results that were available during my care of the patient were reviewed by me and considered in my medical decision making (see chart for details).  Acute cystitis without hematuria, dysuria  Urinalysis showing nitrates, sent for culture, per chart review E. coli typically present antibiotic chosen based on this, prescribed Macrobid, patient was unable to urinate in clinic and therefore we used a dirty sample for urinalysis that she brought from home, was instructed to return to clinic with clean urine sample for culturing, recommended over-the-counter medications and nonpharmacological supportive care with follow-up as needed Final Clinical Impressions(s) / UC Diagnoses   Final diagnoses:  Dysuria  Acute cystitis without hematuria     Discharge Instructions      Your urinalysis shows Ryin Schillo blood cells and nitrates which are indicative of infection, your urine will be sent to the lab to determine exactly which bacteria is present, if any changes need to be made to your medications you will be notified  Begin use of Macrobid twice daily for 5 days  You may use over-the-counter Azo to help minimize your symptoms until antibiotic removes bacteria, this medication will turn your urine orange  Increase your fluid intake through use of water  As always practice good hygiene,  wiping front to back and avoidance of scented vaginal products to prevent further irritation  If symptoms continue to persist after use of medication or recur please follow-up with urgent care or your primary doctor as needed    ED Prescriptions     Medication Sig Dispense Auth. Provider   nitrofurantoin, macrocrystal-monohydrate, (MACROBID) 100 MG capsule Take 1 capsule (100 mg total) by mouth 2 (two) times daily. 10 capsule Carrie Mccolgan Warren, [name]      PDMP not reviewed this encounter.   [name], [place] 11/13/23 [phone]

## 2023-11-16 ENCOUNTER — Other Ambulatory Visit: Payer: Self-pay | Admitting: Family Medicine

## 2023-11-16 DIAGNOSIS — K219 Gastro-esophageal reflux disease without esophagitis: Secondary | ICD-10-CM

## 2023-11-16 LAB — OPHTHALMOLOGY REPORT-SCANNED

## 2023-12-09 ENCOUNTER — Other Ambulatory Visit: Payer: Self-pay | Admitting: Family Medicine

## 2023-12-09 DIAGNOSIS — D509 Iron deficiency anemia, unspecified: Secondary | ICD-10-CM

## 2023-12-16 ENCOUNTER — Other Ambulatory Visit: Payer: Self-pay | Admitting: Family Medicine

## 2023-12-16 DIAGNOSIS — E1165 Type 2 diabetes mellitus with hyperglycemia: Secondary | ICD-10-CM

## 2023-12-20 ENCOUNTER — Other Ambulatory Visit: Payer: Self-pay

## 2023-12-20 ENCOUNTER — Inpatient Hospital Stay
Admission: EM | Admit: 2023-12-20 | Discharge: 2023-12-22 | DRG: 305 | Disposition: A | Discharge status: Home / Self Care | Attending: Internal Medicine | Admitting: Internal Medicine

## 2023-12-20 ENCOUNTER — Emergency Department

## 2023-12-20 DIAGNOSIS — I10 Essential (primary) hypertension: Secondary | ICD-10-CM | POA: Diagnosis present

## 2023-12-20 DIAGNOSIS — R55 Syncope and collapse: Principal | ICD-10-CM

## 2023-12-20 DIAGNOSIS — Z9841 Cataract extraction status, right eye: Secondary | ICD-10-CM

## 2023-12-20 DIAGNOSIS — Z9842 Cataract extraction status, left eye: Secondary | ICD-10-CM

## 2023-12-20 DIAGNOSIS — Z79899 Other long term (current) drug therapy: Secondary | ICD-10-CM

## 2023-12-20 DIAGNOSIS — N390 Urinary tract infection, site not specified: Secondary | ICD-10-CM | POA: Diagnosis not present

## 2023-12-20 DIAGNOSIS — Z961 Presence of intraocular lens: Secondary | ICD-10-CM | POA: Diagnosis present

## 2023-12-20 DIAGNOSIS — I160 Hypertensive urgency: Principal | ICD-10-CM | POA: Diagnosis present

## 2023-12-20 DIAGNOSIS — Z7984 Long term (current) use of oral hypoglycemic drugs: Secondary | ICD-10-CM

## 2023-12-20 DIAGNOSIS — E785 Hyperlipidemia, unspecified: Secondary | ICD-10-CM | POA: Diagnosis present

## 2023-12-20 DIAGNOSIS — I161 Hypertensive emergency: Secondary | ICD-10-CM | POA: Diagnosis present

## 2023-12-20 DIAGNOSIS — F419 Anxiety disorder, unspecified: Secondary | ICD-10-CM | POA: Diagnosis present

## 2023-12-20 DIAGNOSIS — Z79890 Hormone replacement therapy (postmenopausal): Secondary | ICD-10-CM

## 2023-12-20 DIAGNOSIS — K219 Gastro-esophageal reflux disease without esophagitis: Secondary | ICD-10-CM | POA: Diagnosis present

## 2023-12-20 DIAGNOSIS — G8929 Other chronic pain: Secondary | ICD-10-CM

## 2023-12-20 DIAGNOSIS — E039 Hypothyroidism, unspecified: Secondary | ICD-10-CM | POA: Diagnosis not present

## 2023-12-20 DIAGNOSIS — F32A Depression, unspecified: Secondary | ICD-10-CM | POA: Diagnosis present

## 2023-12-20 DIAGNOSIS — Z7982 Long term (current) use of aspirin: Secondary | ICD-10-CM

## 2023-12-20 DIAGNOSIS — Z90710 Acquired absence of both cervix and uterus: Secondary | ICD-10-CM

## 2023-12-20 DIAGNOSIS — Z9181 History of falling: Secondary | ICD-10-CM

## 2023-12-20 DIAGNOSIS — E119 Type 2 diabetes mellitus without complications: Secondary | ICD-10-CM | POA: Diagnosis present

## 2023-12-20 DIAGNOSIS — Z7985 Long-term (current) use of injectable non-insulin antidiabetic drugs: Secondary | ICD-10-CM

## 2023-12-20 DIAGNOSIS — Z818 Family history of other mental and behavioral disorders: Secondary | ICD-10-CM

## 2023-12-20 DIAGNOSIS — I951 Orthostatic hypotension: Secondary | ICD-10-CM | POA: Diagnosis present

## 2023-12-20 DIAGNOSIS — Z7983 Long term (current) use of bisphosphonates: Secondary | ICD-10-CM

## 2023-12-20 DIAGNOSIS — Z8249 Family history of ischemic heart disease and other diseases of the circulatory system: Secondary | ICD-10-CM

## 2023-12-20 LAB — BASIC METABOLIC PANEL WITH GFR
Anion gap: 14 (ref 5–15)
BUN: 6 mg/dL — ABNORMAL LOW (ref 8–23)
CO2: 22 mmol/L (ref 22–32)
Calcium: 8.9 mg/dL (ref 8.9–10.3)
Chloride: 102 mmol/L (ref 98–111)
Creatinine, Ser: 0.64 mg/dL (ref 0.44–1.00)
GFR, Estimated: >60 mL/min (ref >60)
Glucose, Bld: 166 mg/dL — ABNORMAL HIGH (ref 70–99)
Potassium: 3.8 mmol/L (ref 3.5–5.1)
Sodium: 139 mmol/L (ref 135–145)

## 2023-12-20 LAB — CBC
HCT: 37.3 % (ref 36.0–46.0)
Hemoglobin: 12.2 g/dL (ref 12.0–15.0)
MCH: 29.2 pg (ref 26.0–34.0)
MCHC: 32.7 g/dL (ref 30.0–36.0)
MCV: 89.2 fL (ref 80.0–100.0)
Platelets: 228 K/uL (ref 150–400)
RBC: 4.18 MIL/uL (ref 3.87–5.11)
RDW: 12.6 % (ref 11.5–15.5)
WBC: 9.8 K/uL (ref 4.0–10.5)
nRBC: 0 % (ref 0.0–0.2)

## 2023-12-20 LAB — URINALYSIS, ROUTINE W REFLEX MICROSCOPIC
Bacteria, UA: NONE SEEN
Bilirubin Urine: NEGATIVE
Glucose, UA: NEGATIVE mg/dL
Hgb urine dipstick: NEGATIVE
Ketones, ur: NEGATIVE mg/dL
Nitrite: NEGATIVE
Protein, ur: NEGATIVE mg/dL
Specific Gravity, Urine: 1.004 — ABNORMAL LOW (ref 1.005–1.030)
Squamous Epithelial / HPF: 0 /HPF (ref 0–5)
pH: 5 (ref 5.0–8.0)

## 2023-12-20 LAB — TROPONIN T, HIGH SENSITIVITY
Troponin T High Sensitivity: 17 ng/L (ref 0–19)
Troponin T High Sensitivity: 21 ng/L — ABNORMAL HIGH (ref 0–19)

## 2023-12-20 LAB — CBG MONITORING, ED: Glucose-Capillary: 189 mg/dL — ABNORMAL HIGH (ref 70–99)

## 2023-12-20 MED ORDER — LOPERAMIDE HCL 2 MG PO CAPS: 4.0000 mg | ORAL_CAPSULE | ORAL | Status: DC | PRN

## 2023-12-20 MED ORDER — ONDANSETRON HCL 4 MG/2ML IJ SOLN: 4.0000 mg | Freq: Four times a day (QID) | INTRAMUSCULAR | Status: DC | PRN

## 2023-12-20 MED ORDER — ZOLPIDEM TARTRATE 5 MG PO TABS
5.0000 mg | ORAL_TABLET | Freq: Every day | ORAL | Status: DC
  Administered 2023-12-20 – 2023-12-21 (×2): 5 mg via ORAL
  Filled 2023-12-20 (×2): qty 1

## 2023-12-20 MED ORDER — METOPROLOL TARTRATE 25 MG PO TABS
12.5000 mg | ORAL_TABLET | Freq: Two times a day (BID) | ORAL | Status: DC
  Administered 2023-12-20: 12.5 mg via ORAL
  Filled 2023-12-20: qty 1

## 2023-12-20 MED ORDER — LISINOPRIL 20 MG PO TABS
20.0000 mg | ORAL_TABLET | Freq: Every day | ORAL | Status: DC
  Administered 2023-12-21: 20 mg via ORAL
  Filled 2023-12-20: qty 2

## 2023-12-20 MED ORDER — QUETIAPINE FUMARATE 25 MG PO TABS
75.0000 mg | ORAL_TABLET | Freq: Three times a day (TID) | ORAL | Status: DC
  Administered 2023-12-20 – 2023-12-22 (×5): 75 mg via ORAL
  Filled 2023-12-20 (×5): qty 3

## 2023-12-20 MED ORDER — SUCRALFATE 1 G PO TABS
1.0000 g | ORAL_TABLET | Freq: Three times a day (TID) | ORAL | Status: DC
  Administered 2023-12-20 – 2023-12-22 (×7): 1 g via ORAL
  Filled 2023-12-20 (×7): qty 1

## 2023-12-20 MED ORDER — QUETIAPINE FUMARATE 25 MG PO TABS: 50.0000 mg | ORAL_TABLET | ORAL | Status: DC

## 2023-12-20 MED ORDER — INSULIN ASPART 100 UNIT/ML IJ SOLN
0.0000 [IU] | Freq: Three times a day (TID) | INTRAMUSCULAR | Status: DC
  Administered 2023-12-21: 3 [IU] via SUBCUTANEOUS
  Administered 2023-12-21: 2 [IU] via SUBCUTANEOUS
  Administered 2023-12-21 – 2023-12-22 (×2): 3 [IU] via SUBCUTANEOUS
  Administered 2023-12-22: 2 [IU] via SUBCUTANEOUS
  Filled 2023-12-20 (×2): qty 3
  Filled 2023-12-20: qty 2
  Filled 2023-12-20: qty 3
  Filled 2023-12-20: qty 2

## 2023-12-20 MED ORDER — METOPROLOL TARTRATE 5 MG/5ML IV SOLN
5.0000 mg | Freq: Once | INTRAVENOUS | Status: AC
  Administered 2023-12-20: 5 mg via INTRAVENOUS
  Filled 2023-12-20: qty 5

## 2023-12-20 MED ORDER — MAGNESIUM HYDROXIDE 400 MG/5ML PO SUSP: 30.0000 mL | Freq: Every day | ORAL | Status: DC | PRN

## 2023-12-20 MED ORDER — LIDOCAINE 5 % EX PTCH
1.0000 | MEDICATED_PATCH | CUTANEOUS | Status: DC
  Filled 2023-12-20 (×3): qty 1

## 2023-12-20 MED ORDER — SODIUM CHLORIDE 0.9 % IV BOLUS
500.0000 mL | Freq: Once | INTRAVENOUS | Status: AC
  Administered 2023-12-20: 500 mL via INTRAVENOUS

## 2023-12-20 MED ORDER — INSULIN ASPART 100 UNIT/ML IJ SOLN: 0.0000 [IU] | Freq: Every day | INTRAMUSCULAR | Status: DC

## 2023-12-20 MED ORDER — BUSPIRONE HCL 10 MG PO TABS
10.0000 mg | ORAL_TABLET | Freq: Two times a day (BID) | ORAL | Status: DC
  Administered 2023-12-20 – 2023-12-22 (×4): 10 mg via ORAL
  Filled 2023-12-20: qty 2
  Filled 2023-12-20 (×2): qty 1
  Filled 2023-12-20: qty 2

## 2023-12-20 MED ORDER — ATORVASTATIN CALCIUM 20 MG PO TABS
40.0000 mg | ORAL_TABLET | Freq: Every day | ORAL | Status: DC
  Administered 2023-12-20 – 2023-12-21 (×2): 40 mg via ORAL
  Filled 2023-12-20 (×2): qty 2

## 2023-12-20 MED ORDER — ZOLPIDEM TARTRATE 5 MG PO TABS: 10.0000 mg | ORAL_TABLET | Freq: Every day | ORAL | Status: DC

## 2023-12-20 MED ORDER — TRAZODONE HCL 50 MG PO TABS: 25.0000 mg | ORAL_TABLET | Freq: Every evening | ORAL | Status: DC | PRN

## 2023-12-20 MED ORDER — PANTOPRAZOLE SODIUM 40 MG PO TBEC
40.0000 mg | DELAYED_RELEASE_TABLET | Freq: Two times a day (BID) | ORAL | Status: DC
  Administered 2023-12-20 – 2023-12-22 (×4): 40 mg via ORAL
  Filled 2023-12-20 (×4): qty 1

## 2023-12-20 MED ORDER — ASPIRIN 81 MG PO TBEC
81.0000 mg | DELAYED_RELEASE_TABLET | Freq: Every day | ORAL | Status: DC
  Administered 2023-12-20 – 2023-12-21 (×2): 81 mg via ORAL
  Filled 2023-12-20 (×2): qty 1

## 2023-12-20 MED ORDER — FERROUS SULFATE 325 (65 FE) MG PO TABS
325.0000 mg | ORAL_TABLET | Freq: Every day | ORAL | Status: DC
  Administered 2023-12-21 – 2023-12-22 (×2): 325 mg via ORAL
  Filled 2023-12-20 (×2): qty 1

## 2023-12-20 MED ORDER — ACETAMINOPHEN 650 MG RE SUPP: 650.0000 mg | Freq: Four times a day (QID) | RECTAL | Status: DC | PRN

## 2023-12-20 MED ORDER — ENOXAPARIN SODIUM 40 MG/0.4ML IJ SOSY
40.0000 mg | PREFILLED_SYRINGE | Freq: Every day | INTRAMUSCULAR | Status: DC
  Administered 2023-12-20 – 2023-12-21 (×2): 40 mg via SUBCUTANEOUS
  Filled 2023-12-20 (×2): qty 0.4

## 2023-12-20 MED ORDER — ACETAMINOPHEN 325 MG PO TABS
650.0000 mg | ORAL_TABLET | Freq: Four times a day (QID) | ORAL | Status: DC | PRN
  Administered 2023-12-21 – 2023-12-22 (×2): 650 mg via ORAL
  Filled 2023-12-20 (×2): qty 2

## 2023-12-20 MED ORDER — LEVOTHYROXINE SODIUM 50 MCG PO TABS
50.0000 ug | ORAL_TABLET | Freq: Every day | ORAL | Status: DC
  Administered 2023-12-21 – 2023-12-22 (×2): 50 ug via ORAL
  Filled 2023-12-20 (×2): qty 1

## 2023-12-20 MED ORDER — ONDANSETRON HCL 4 MG PO TABS: 4.0000 mg | ORAL_TABLET | Freq: Four times a day (QID) | ORAL | Status: DC | PRN

## 2023-12-20 MED ORDER — TRAMADOL HCL 50 MG PO TABS: 50.0000 mg | ORAL_TABLET | Freq: Four times a day (QID) | ORAL | Status: DC | PRN

## 2023-12-20 MED ORDER — SODIUM CHLORIDE 0.9 % IV SOLN: INTRAVENOUS | Status: DC

## 2023-12-20 NOTE — ED Triage Notes (Signed)
 Arrived by [place] from [place]. C/o weakness. EMS reports lots of assistance with ambulating and baseline is now help.    EMS vitals: 181CBG 201/96 b/p 110HR 22G left hand

## 2023-12-20 NOTE — ED Notes (Signed)
CBG 189.

## 2023-12-20 NOTE — Progress Notes (Signed)
 PHARMACIST - PHYSICIAN ORDER COMMUNICATION  CONCERNING: P[employer] Medication Policy for Ambien  (Zolpidem )  DESCRIPTION:  This patient's order for:  Ambien  (Zolpidem ) 10mg  QHS  has been noted.  For female patients and patients age 79 years or older, dosage of zolpidem  automatically limited to 5 mg.  ACTION TAKEN: The pharmacy department has adjusted the dose of Zolpidem  to 5mg  per policy.   Estill CHRISTELLA Lutes, PharmD, BCPS Clinical Pharmacist 12/20/2023 9:05 PM

## 2023-12-20 NOTE — H&P (Signed)
 [place]   PATIENT NAME: [name]    MR#:  [id number]  DATE OF BIRTH:  12/22/1945  DATE OF ADMISSION:  12/20/2023  PRIMARY CARE PHYSICIAN: Chandra Toribio POUR, MD   Patient is coming from: Home  REQUESTING/REFERRING PHYSICIAN: Floy Roberts, MD  CHIEF COMPLAINT:   Chief Complaint  Patient presents with   Weakness    HISTORY OF PRESENT ILLNESS:  [name] is a 78 y.o. Caucasian female with medical history significant for anxiety, depression, type 2 diabetes mellitus, hypertension, dyslipidemia and hypothyroidism as well as GERD, who presented to the emergency room with acute onset of near syncope with associated dizziness and lightheadedness when she went out to get breakfast and was trying to get out of her car.  Her daughter brought a seat to get her out and later called the ambulance.  The patient felt numb all over however denied any unilateral paresthesias or focal muscle weakness.  She denied any dysphagia or dysarthria.  No cough or wheezing or dyspnea.  No chest pain or palpitations.  No dysuria, oliguria or hematuria or flank pain.  ED Course: When she came to the ER, BP was 158/96 and later 192/82 with otherwise normal vital signs.  Labs revealed blood glucose of 166 with otherwise normal BMP.  CBC was within normal.  UA showed specific gravity 1004 with 21-50 WBCs concerning for UTI. EKG as reviewed by me : EKG showed normal sinus rhythm with a rate of 97 with left axis deviation and left bundle branch block which is old. Imaging: 2 view chest x-ray showed large hiatal hernia and left basilar atelectasis.  The patient was given 500 mL IV normal saline bolus and 5 mg of IV Lopressor .  She will be admitted to progressive unit observation bed for further evaluation and management. PAST MEDICAL HISTORY:   Past Medical History:  Diagnosis Date   Anxiety    Depression    Diabetes mellitus, type II (HCC)    GERD (gastroesophageal reflux disease)     HLD (hyperlipidemia)    Hypertension    Thyroid  disease     PAST SURGICAL HISTORY:   Past Surgical History:  Procedure Laterality Date   ABDOMINAL HYSTERECTOMY     BREAST EXCISIONAL BIOPSY Bilateral 80s   benign   BREAST SURGERY     CATARACT EXTRACTION W/PHACO Right 10/14/2020   Procedure: CATARACT EXTRACTION PHACO AND INTRAOCULAR LENS PLACEMENT (IOC)RIGHT  DIABETIC 5.54 00:54.6;  Surgeon: Mittie Gaskin, MD;  Location: [facility];  Service: Ophthalmology;  Laterality: Right;   CATARACT EXTRACTION W/PHACO Left 10/28/2020   Procedure: CATARACT EXTRACTION PHACO AND INTRAOCULAR LENS PLACEMENT (IOC) LEFT DIABETIC;  Surgeon: Mittie Gaskin, MD;  Location: [facility];  Service: Ophthalmology;  Laterality: Left;  8.13 01:09.5   COLONOSCOPY N/A 07/14/2022   Procedure: COLONOSCOPY;  Surgeon: Therisa Bi, MD;  Location: [facility] ENDOSCOPY;  Service: Gastroenterology;  Laterality: N/A;   COLONOSCOPY WITH PROPOFOL  N/A 04/12/2019   Procedure: COLONOSCOPY WITH PROPOFOL ;  Surgeon: Therisa Bi, MD;  Location: [facility] ENDOSCOPY;  Service: Gastroenterology;  Laterality: N/A;   ESOPHAGOGASTRODUODENOSCOPY (EGD) WITH PROPOFOL  N/A 07/14/2022   Procedure: ESOPHAGOGASTRODUODENOSCOPY (EGD) WITH PROPOFOL ;  Surgeon: Therisa Bi, MD;  Location: [facility] ENDOSCOPY;  Service: Gastroenterology;  Laterality: N/A;   EYE SURGERY     GIVENS CAPSULE STUDY N/A 08/31/2022   Procedure: GIVENS CAPSULE STUDY;  Surgeon: Therisa Bi, MD;  Location: [facility] ENDOSCOPY;  Service: Gastroenterology;  Laterality: N/A;   JOINT REPLACEMENT Right 2019  SOCIAL HISTORY:   Social History   Tobacco Use   Smoking status: Never   Smokeless tobacco: Never  Substance Use Topics   Alcohol use: No    Alcohol/week: 0.0 standard drinks of alcohol    FAMILY HISTORY:   Family History  Problem Relation Age of Onset   Anxiety disorder Mother    Colon cancer Mother    Heart disease Father    Anxiety disorder Father     Obesity Sister    Anxiety disorder Sister    Depression Sister    Osteoporosis Sister    Breast cancer Paternal Aunt     DRUG ALLERGIES:  No Known Allergies  REVIEW OF SYSTEMS:   ROS As per history of present illness. All pertinent systems were reviewed above. Constitutional, HEENT, cardiovascular, respiratory, GI, GU, musculoskeletal, neuro, psychiatric, endocrine, integumentary and hematologic systems were reviewed and are otherwise negative/unremarkable except for positive findings mentioned above in the HPI.   MEDICATIONS AT HOME:   Prior to Admission medications   Medication Sig Start Date End Date Taking? Authorizing Provider  alendronate  (FOSAMAX ) 70 MG tablet TAKE 1 TABLET (70 MG TOTAL) BY MOUTH EVERY 7 DAYS WITH FULL GLASS WATER ON EMPTY STOMACH Patient taking differently: 70 mg once a week. On Sundays 10/23/23  Yes Chandra Toribio POUR, MD  aspirin  81 MG tablet Take 1 tablet by mouth daily.   Yes [provider]  atorvastatin  (LIPITOR) 40 MG tablet TAKE 1 TABLET BY MOUTH EVERY DAY 06/26/23  Yes Chandra Toribio POUR, MD  busPIRone  (BUSPAR ) 10 MG tablet TAKE 2 TABLETS BY MOUTH 2 TIMES DAILY. TAKE 1/2 TO 1 TABLET BY MOUTH TWICE DAILY. 01/24/23  Yes Chandra Toribio POUR, MD  citalopram  (CELEXA ) 40 MG tablet TAKE 1 TABLET BY MOUTH EVERY DAY 12/22/22  Yes Chandra Toribio POUR, MD  Dulaglutide  (TRULICITY ) 0.75 MG/0.5ML SOAJ INJECT 0.75MG  DOSE UNDER THE SKIN ONE TIME PER WEEK 12/18/23  Yes Chandra Toribio POUR, MD  ferrous sulfate  325 (65 FE) MG tablet TAKE 1 TABLET BY MOUTH EVERY DAY WITH BREAKFAST 12/11/23  Yes Clapp, Kara F, PA-C  levothyroxine  (SYNTHROID ) 50 MCG tablet TAKE 1 TABLET BY MOUTH EVERY DAY IN THE MORNING 06/26/23  Yes Chandra Toribio POUR, MD  lidocaine  (LIDODERM ) 5 % PLACE 1 PATCH ONTO THE SKIN DAILY. REMOVE & DISCARD PATCH WITHIN 12 HOURS OR AS DIRECTED BY MD 05/29/23  Yes Chandra Toribio POUR, MD  lisinopril  (ZESTRIL ) 20 MG tablet TAKE 1 TABLET BY MOUTH EVERY DAY 04/07/23  Yes Chandra Toribio POUR, MD   loperamide  (IMODIUM ) 2 MG capsule Take 2 capsules (4 mg total) by mouth as needed for diarrhea or loose stools. 12/22/22  Yes Chandra Toribio POUR, MD  metFORMIN  (GLUCOPHAGE ) 850 MG tablet TAKE 1 TABLET (850 MG TOTAL) BY MOUTH 2 (TWO) TIMES DAILY WITH A MEAL. 11/03/23  Yes Chandra Toribio POUR, MD  metoprolol  tartrate (LOPRESSOR ) 25 MG tablet TAKE 1 TABLET BY MOUTH TWICE A DAY Patient taking differently: Take 12.5 mg by mouth 2 (two) times daily. 03/10/23  Yes Chandra Toribio POUR, MD  naproxen  (NAPROSYN ) 375 MG tablet Take 1 tablet (375 mg total) by mouth 2 (two) times daily as needed for moderate pain (pain score 4-6). 12/22/22  Yes Chandra Toribio POUR, MD  pantoprazole  (PROTONIX ) 40 MG tablet TAKE 1 TABLET BY MOUTH TWICE A DAY 11/16/23  Yes Clapp, Kara F, PA-C  promethazine  (PHENERGAN ) 25 MG tablet TAKE 1 TABLET BY MOUTH TWICE A DAY AS NEEDED FOR NAUSEA OR VOMITING 02/13/23  Yes Chandra Toribio POUR, MD  QUEtiapine  (SEROQUEL ) 50 MG tablet TAKE 1 TABLET BY MOUTH 3 TIMES DAILY. MAY TAKE 1 TABLET IN AFTERNOON IF NEEDED FOR SEVERE ANXIETY. Patient taking differently: Takes 75 mg three times daily 01/19/23  Yes Chandra Toribio POUR, MD  sucralfate  (CARAFATE ) 1 g tablet TAKE 1 TABLET BY MOUTH FOUR TIMES A DAY AS NEEDED 02/20/23  Yes Chandra Toribio POUR, MD  traMADol  (ULTRAM ) 50 MG tablet TAKE 1 TABLET BY MOUTH EVERY 6 HOURS AS NEEDED 10/30/23  Yes Chandra Toribio POUR, MD  zolpidem  (AMBIEN ) 10 MG tablet TAKE 1 TABLET BY MOUTH EVERY DAY AT BEDTIME AS NEEDED FOR SLEEP Patient taking differently: Take 10 mg by mouth at bedtime. 10/19/23  Yes Chandra Toribio POUR, MD  diltiazem  (CARDIZEM  CD) 120 MG 24 hr capsule TAKE 1 CAPSULE BY MOUTH EVERY DAY Patient not taking: Reported on 12/20/2023 07/31/23   Chandra Toribio POUR, MD  diltiazem  (CARDIZEM ) 30 MG tablet TAKE 1 TABLET (30 MG TOTAL) 2 (TWO) TIMES DAILY FOR 30 DAYS, THEN 1 TABLET (30 MG TOTAL) DAILY. Patient not taking: Reported on 12/20/2023 08/28/23   Chandra Toribio POUR, MD  nitrofurantoin,  macrocrystal-monohydrate, (MACROBID) 100 MG capsule Take 1 capsule (100 mg total) by mouth 2 (two) times daily. Patient not taking: Reported on 12/20/2023 11/08/23   Teresa Shelba SAUNDERS, NP  nystatin  ointment (MYCOSTATIN ) Apply 1 Application topically 2 (two) times daily. Patient not taking: Reported on 12/20/2023 04/07/23   Chandra Toribio POUR, MD      VITAL SIGNS:  Blood pressure (!) 185/85, pulse 89, temperature 97.9 F (36.6 C), temperature source Oral, resp. rate 18, height 5' (1.524 m), weight 73.5 kg, SpO2 94%.  PHYSICAL EXAMINATION:  Physical Exam  GENERAL:  78 y.o.-year-old Caucasian female patient lying in the bed with no acute distress.  EYES: Pupils equal, round, reactive to light and accommodation. No scleral icterus. Extraocular muscles intact.  HEENT: Head atraumatic, normocephalic. Oropharynx and nasopharynx clear.  NECK:  Supple, no jugular venous distention. No thyroid  enlargement, no tenderness.  LUNGS: Normal breath sounds bilaterally, no wheezing, rales,rhonchi or crepitation. No use of accessory muscles of respiration.  CARDIOVASCULAR: Regular rate and rhythm, S1, S2 normal. No murmurs, rubs, or gallops.  ABDOMEN: Soft, nondistended, nontender. Bowel sounds present. No organomegaly or mass.  EXTREMITIES: No pedal edema, cyanosis, or clubbing.  NEUROLOGIC: Cranial nerves II through XII are intact. Muscle strength 5/5 in all extremities. Sensation intact. Gait not checked.  PSYCHIATRIC: The patient is alert and oriented x 3.  Normal affect and good eye contact. SKIN: No obvious rash, lesion, or ulcer.   LABORATORY PANEL:   CBC Recent Labs  Lab 12/21/23 0225  WBC 10.1  HGB 11.2*  HCT 34.3*  PLT 221   ------------------------------------------------------------------------------------------------------------------  Chemistries  Recent Labs  Lab 12/21/23 0225  NA 138  K 3.6  CL 103  CO2 26  GLUCOSE 149*  BUN <5*  CREATININE 0.63  CALCIUM  8.3*    ------------------------------------------------------------------------------------------------------------------  Cardiac Enzymes No results for input(s): TROPONINI in the last 168 hours. ------------------------------------------------------------------------------------------------------------------  RADIOLOGY:  DG Chest 2 View Result Date: 12/20/2023 EXAM: 2 VIEW(S) XRAY OF THE CHEST 12/20/2023 03:26:10 PM COMPARISON: 04/09/2018 CLINICAL HISTORY: weakness FINDINGS: LUNGS AND PLEURA: Left basilar atelectasis. No pleural effusion. No pneumothorax. HEART AND MEDIASTINUM: Large hiatal hernia is noted. No acute abnormality of the cardiac and mediastinal silhouettes. BONES AND SOFT TISSUES: No acute osseous abnormality. IMPRESSION: 1. Large hiatal hernia. 2. Left basilar atelectasis. Electronically signed by: Lynwood Seip  MD 12/20/2023 04:24 PM EST RP Workstation: [id number]      IMPRESSION AND PLAN:  Assessment and Plan: * Hypertensive urgency - The patient will be admitted to progressive unit observation bed. - Will continue antihypertensive therapy. - She will be placed on as needed IV labetalol and hydralazine. - This could be the main reason for her dizziness and lightheadedness and presyncope.  Near syncope - This is likely secondary to above. - Management as above. - Will monitor her for arrhythmias. - Will check orthostatics.  Hypothyroidism - Will continue Synthroid  and check TSH.  Controlled type 2 diabetes mellitus without complication, without long-term current use of insulin  (HCC) - The patient will be placed on supplemental coverage with NovoLog . - Will hold off metformin .  Dyslipidemia - Will continue statin therapy.  Anxiety and depression - Will continue BuSpar  and Seroquel  as well as Celexa .  Acute lower UTI - Will place on IV Rocephin and follow urine culture and sensitivity.   DVT prophylaxis: Lovenox . Advanced Care Planning:  Code Status: full  code. Family Communication:  The plan of care was discussed in details with the patient (and family). I answered all questions. The patient agreed to proceed with the above mentioned plan. Further management will depend upon hospital course. Disposition Plan: Back to previous home environment Consults called: none. All the records are reviewed and case discussed with ED provider.  Status is: Observation  I certify that at the time of admission, it is my clinical judgment that the patient will require hospital care extending LESS than 2 midnights.                            Dispo: The patient is from: Home              Anticipated d/c is to: Home              Patient currently is not medically stable to d/c.              Difficult to place patient: No  Madison DELENA Peaches M.D on 12/21/2023 at 6:08 AM  [facility]   From 7 PM-7 AM, contact night-coverage [email or web address]  CC: Primary care physician; Chandra Toribio POUR, MD

## 2023-12-20 NOTE — ED Provider Notes (Signed)
 [facility] Provider Note    Event Date/Time   First MD Initiated Contact with Patient 12/20/23 1557     (approximate)   History   Weakness   HPI  [name] is a 78 y.o. female  who presents to the emergency department today because of concern for weakness and near syncopal episode. The patient was getting out of her car at a restaurant when she started to feel weak. It took her and family three tries before she was able to get out of the car but then felt like she might pass out as she was going in [employer]. The patient says that similar episodes have happened in the past and was admitted for similar symptoms last year. Per chart review patient was admitted for presyncope secondary to orthostatic hypotension.  Physical Exam   Triage Vital Signs: ED Triage Vitals  Encounter Vitals Group     BP 12/20/23 1457 (!) 158/96     Girls Systolic BP Percentile --      Girls Diastolic BP Percentile --      Boys Systolic BP Percentile --      Boys Diastolic BP Percentile --      Pulse Rate 12/20/23 1457 98     Resp 12/20/23 1457 17     Temp 12/20/23 1457 98.3 F (36.8 C)     Temp Source 12/20/23 1457 Oral     SpO2 12/20/23 1457 94 %     Weight --      Height --      Head Circumference --      Peak Flow --      Pain Score 12/20/23 1459 0     Pain Loc --      Pain Education --      Exclude from Growth Chart --     Most recent vital signs: Vitals:   12/20/23 1457  BP: (!) 158/96  Pulse: 98  Resp: 17  Temp: 98.3 F (36.8 C)  SpO2: 94%   General: Awake, alert, oriented. CV:  Good peripheral perfusion. Regular rate and rhythm. Resp:  Normal effort. Lungs clear. Abd:  No distention.    ED Results / Procedures / Treatments   Labs (all labs ordered are listed, but only abnormal results are displayed) Labs Reviewed  BASIC METABOLIC PANEL WITH GFR - Abnormal; Notable for the following components:      Result Value   Glucose, Bld 166  (*)    BUN 6 (*)    All other components within normal limits  TROPONIN T, HIGH SENSITIVITY - Abnormal; Notable for the following components:   Troponin T High Sensitivity 21 (*)    All other components within normal limits  CBC  URINALYSIS, ROUTINE W REFLEX MICROSCOPIC  BASIC METABOLIC PANEL WITH GFR  CBC  TROPONIN T, HIGH SENSITIVITY     EKG  I, Guadalupe Eagles, attending physician, personally viewed and interpreted this EKG  EKG Time: 1500 Rate: 97 Rhythm: normal sinus rhythm Axis: left axis deviation Intervals: qtc 508 QRS: LBBB ST changes: no st elevation Impression: abnormal ekg    RADIOLOGY I independently interpreted and visualized the CXR. My interpretation: No pneumonia Radiology interpretation:  IMPRESSION:  1. Large hiatal hernia.  2. Left basilar atelectasis.     PROCEDURES:  Critical Care performed: No    MEDICATIONS ORDERED IN ED: Medications - No data to display   IMPRESSION / MDM / ASSESSMENT AND PLAN / ED COURSE  I  reviewed the triage vital signs and the nursing notes.                              Differential diagnosis includes, but is not limited to, anemia, electrolyte abnormality, ACS, orthostatic hypotension  Patient's presentation is most consistent with acute presentation with potential threat to life or bodily function.   Patient presented to the emergency department today because concerns for lightheadedness and near syncope.  On exam patient is awake and alert.  Blood work without concerning anemia or electrolyte abnormality.  Troponin went from 17 to very minimally elevated at 21.  Notably however the patient was hypertensive here in the emergency department and remained so.  This time given near syncope and significant hypertension I do think patient would benefit from further workup and management.  Discussed with Dr. Lawence with hospital service who will evaluate for admission.   FINAL CLINICAL IMPRESSION(S) / ED DIAGNOSES    Final diagnoses:  Near syncope  Hypertension, unspecified type     Note:  This document was prepared using Dragon voice recognition software and may include unintentional dictation errors.    Floy Roberts, MD 12/20/23 2011

## 2023-12-20 NOTE — ED Triage Notes (Signed)
 See first nurse note.

## 2023-12-21 DIAGNOSIS — I160 Hypertensive urgency: Secondary | ICD-10-CM | POA: Diagnosis present

## 2023-12-21 DIAGNOSIS — Z9841 Cataract extraction status, right eye: Secondary | ICD-10-CM | POA: Diagnosis not present

## 2023-12-21 DIAGNOSIS — I951 Orthostatic hypotension: Secondary | ICD-10-CM | POA: Diagnosis present

## 2023-12-21 DIAGNOSIS — Z7983 Long term (current) use of bisphosphonates: Secondary | ICD-10-CM | POA: Diagnosis not present

## 2023-12-21 DIAGNOSIS — Z79890 Hormone replacement therapy (postmenopausal): Secondary | ICD-10-CM | POA: Diagnosis not present

## 2023-12-21 DIAGNOSIS — Z90710 Acquired absence of both cervix and uterus: Secondary | ICD-10-CM | POA: Diagnosis not present

## 2023-12-21 DIAGNOSIS — F419 Anxiety disorder, unspecified: Secondary | ICD-10-CM | POA: Diagnosis present

## 2023-12-21 DIAGNOSIS — I161 Hypertensive emergency: Secondary | ICD-10-CM | POA: Diagnosis present

## 2023-12-21 DIAGNOSIS — N390 Urinary tract infection, site not specified: Secondary | ICD-10-CM | POA: Insufficient documentation

## 2023-12-21 DIAGNOSIS — Z9842 Cataract extraction status, left eye: Secondary | ICD-10-CM | POA: Diagnosis not present

## 2023-12-21 DIAGNOSIS — Z961 Presence of intraocular lens: Secondary | ICD-10-CM | POA: Diagnosis present

## 2023-12-21 DIAGNOSIS — Z79899 Other long term (current) drug therapy: Secondary | ICD-10-CM | POA: Diagnosis not present

## 2023-12-21 DIAGNOSIS — Z7985 Long-term (current) use of injectable non-insulin antidiabetic drugs: Secondary | ICD-10-CM | POA: Diagnosis not present

## 2023-12-21 DIAGNOSIS — Z8249 Family history of ischemic heart disease and other diseases of the circulatory system: Secondary | ICD-10-CM | POA: Diagnosis not present

## 2023-12-21 DIAGNOSIS — R55 Syncope and collapse: Principal | ICD-10-CM

## 2023-12-21 DIAGNOSIS — F32A Depression, unspecified: Secondary | ICD-10-CM | POA: Insufficient documentation

## 2023-12-21 DIAGNOSIS — Z7982 Long term (current) use of aspirin: Secondary | ICD-10-CM | POA: Diagnosis not present

## 2023-12-21 DIAGNOSIS — K219 Gastro-esophageal reflux disease without esophagitis: Secondary | ICD-10-CM | POA: Diagnosis present

## 2023-12-21 DIAGNOSIS — E039 Hypothyroidism, unspecified: Secondary | ICD-10-CM | POA: Diagnosis present

## 2023-12-21 DIAGNOSIS — E785 Hyperlipidemia, unspecified: Secondary | ICD-10-CM | POA: Insufficient documentation

## 2023-12-21 DIAGNOSIS — Z7984 Long term (current) use of oral hypoglycemic drugs: Secondary | ICD-10-CM | POA: Diagnosis not present

## 2023-12-21 DIAGNOSIS — I10 Essential (primary) hypertension: Secondary | ICD-10-CM | POA: Diagnosis present

## 2023-12-21 DIAGNOSIS — Z818 Family history of other mental and behavioral disorders: Secondary | ICD-10-CM | POA: Diagnosis not present

## 2023-12-21 DIAGNOSIS — E119 Type 2 diabetes mellitus without complications: Secondary | ICD-10-CM

## 2023-12-21 LAB — CBC
HCT: 34.3 % — ABNORMAL LOW (ref 36.0–46.0)
Hemoglobin: 11.2 g/dL — ABNORMAL LOW (ref 12.0–15.0)
MCH: 29.1 pg (ref 26.0–34.0)
MCHC: 32.7 g/dL (ref 30.0–36.0)
MCV: 89.1 fL (ref 80.0–100.0)
Platelets: 221 K/uL (ref 150–400)
RBC: 3.85 MIL/uL — ABNORMAL LOW (ref 3.87–5.11)
RDW: 12.7 % (ref 11.5–15.5)
WBC: 10.1 K/uL (ref 4.0–10.5)
nRBC: 0 % (ref 0.0–0.2)

## 2023-12-21 LAB — CBG MONITORING, ED
Glucose-Capillary: 174 mg/dL — ABNORMAL HIGH (ref 70–99)
Glucose-Capillary: 170 mg/dL — ABNORMAL HIGH (ref 70–99)

## 2023-12-21 LAB — BASIC METABOLIC PANEL WITH GFR
Anion gap: 10 (ref 5–15)
BUN: <5 mg/dL — ABNORMAL LOW (ref 8–23)
CO2: 26 mmol/L (ref 22–32)
Calcium: 8.3 mg/dL — ABNORMAL LOW (ref 8.9–10.3)
Chloride: 103 mmol/L (ref 98–111)
Creatinine, Ser: 0.63 mg/dL (ref 0.44–1.00)
GFR, Estimated: >60 mL/min (ref >60)
Glucose, Bld: 149 mg/dL — ABNORMAL HIGH (ref 70–99)
Potassium: 3.6 mmol/L (ref 3.5–5.1)
Sodium: 138 mmol/L (ref 135–145)

## 2023-12-21 LAB — TSH: TSH: 1.11 u[IU]/mL (ref 0.350–4.500)

## 2023-12-21 LAB — GLUCOSE, CAPILLARY
Glucose-Capillary: 131 mg/dL — ABNORMAL HIGH (ref 70–99)
Glucose-Capillary: 174 mg/dL — ABNORMAL HIGH (ref 70–99)

## 2023-12-21 MED ORDER — SODIUM CHLORIDE 0.9 % IV SOLN
1.0000 g | INTRAVENOUS | Status: DC
  Administered 2023-12-21: 1 g via INTRAVENOUS
  Filled 2023-12-21: qty 10

## 2023-12-21 MED ORDER — LISINOPRIL 20 MG PO TABS
40.0000 mg | ORAL_TABLET | Freq: Every day | ORAL | Status: DC
  Administered 2023-12-22: 40 mg via ORAL
  Filled 2023-12-21: qty 2

## 2023-12-21 MED ORDER — SALINE SPRAY 0.65 % NA SOLN
1.0000 | NASAL | Status: DC | PRN
  Administered 2023-12-21: 1 via NASAL
  Filled 2023-12-21: qty 44

## 2023-12-21 MED ORDER — LABETALOL HCL 5 MG/ML IV SOLN
20.0000 mg | INTRAVENOUS | Status: DC | PRN
  Administered 2023-12-21: 20 mg via INTRAVENOUS
  Filled 2023-12-21: qty 4

## 2023-12-21 MED ORDER — HYDRALAZINE HCL 20 MG/ML IJ SOLN
10.0000 mg | Freq: Four times a day (QID) | INTRAMUSCULAR | Status: DC | PRN
  Administered 2023-12-21 (×3): 10 mg via INTRAVENOUS
  Filled 2023-12-21 (×3): qty 1

## 2023-12-21 MED ORDER — LISINOPRIL 20 MG PO TABS
20.0000 mg | ORAL_TABLET | Freq: Once | ORAL | Status: AC
  Administered 2023-12-21: 20 mg via ORAL
  Filled 2023-12-21: qty 1

## 2023-12-21 MED ORDER — METOPROLOL TARTRATE 25 MG PO TABS
25.0000 mg | ORAL_TABLET | Freq: Two times a day (BID) | ORAL | Status: DC
  Administered 2023-12-21 – 2023-12-22 (×3): 25 mg via ORAL
  Filled 2023-12-21 (×3): qty 1

## 2023-12-21 NOTE — Assessment & Plan Note (Signed)
-   The patient will be placed on supplemental coverage with NovoLog. - Will hold off metformin.

## 2023-12-21 NOTE — Assessment & Plan Note (Signed)
-   Will place on IV Rocephin and follow urine culture and sensitivity.

## 2023-12-21 NOTE — ED Notes (Signed)
 Pt was assisted to the bathroom. Pt urinated in toilet. Pt was assisted back to bed. No other needs verbalized at this time. Fall bundle in place.

## 2023-12-21 NOTE — Progress Notes (Signed)
 PROGRESS NOTE    [name]  FMW:[id number] DOB: Jun 12, 1945 DOA: 12/20/2023 PCP: Chandra Toribio POUR, MD    Brief Narrative:   78 y.o. Caucasian female with medical history significant for anxiety, depression, type 2 diabetes mellitus, hypertension, dyslipidemia and hypothyroidism as well as GERD, who presented to the emergency room with acute onset of near syncope with associated dizziness and lightheadedness when she went out to get breakfast and was trying to get out of her car.  Her daughter brought a seat to get her out and later called the ambulance.  The patient felt numb all over however denied any unilateral paresthesias or focal muscle weakness.  She denied any dysphagia or dysarthria.  No cough or wheezing or dyspnea.  No chest pain or palpitations.  No dysuria, oliguria or hematuria or flank pain.    Assessment & Plan:   Principal Problem:   Hypertensive urgency Active Problems:   Near syncope   Hypothyroidism   Acute lower UTI   Anxiety and depression   Dyslipidemia   Controlled type 2 diabetes mellitus without complication, without long-term current use of insulin  (HCC)   Hypertensive emergency  * Hypertensive emergency Suspected the patient's near syncopal event was secondary to severely uncontrolled blood pressure. Plan: Increase metoprolol  to 25 mg p.o. twice daily Increase lisinopril  to 40 mg daily As needed IV labetalol    Near syncope Suspect secondary to uncontrolled blood pressure Request therapy evaluations Blood pressure control as above  Hypothyroidism Continue Synthroid    Controlled type 2 diabetes mellitus without complication, without long-term current use of insulin  (HCC) Hold metformin  Sliding scale Carb modified diet   Dyslipidemia PTA statin   Anxiety and depression BuSpar , Celexa , Seroquel    Acute lower UTI Ruled out.  Urinalysis inconsistent with UTI DC antibiotics   DVT prophylaxis: SQ Lovenox  Code Status: Full Family  Communication: None Disposition Plan: Status is: Inpatient Remains inpatient appropriate because: Hypertensive emergency   Level of care: Telemetry  Consultants:  None  Procedures:  None  Antimicrobials: None   Subjective: Seen and examined sitting in bed.  No distress.  No complaints.  Objective: Vitals:   12/21/23 1230 12/21/23 1300 12/21/23 1330 12/21/23 1427  BP: (!) 188/95 135/85 (!) 184/81 (!) 180/81  Pulse: 90 96 (!) 115 97  Resp: (!) 22 15 (!) 24 19  Temp:    98 F (36.7 C)  TempSrc:      SpO2: 98% 95% 97% 97%  Weight:      Height:        Intake/Output Summary (Last 24 hours) at 12/21/2023 1521 Last data filed at 12/21/2023 9071 Gross per 24 hour  Intake 475.59 ml  Output --  Net 475.59 ml   Filed Weights   12/20/23 1644  Weight: 73.5 kg    Examination:  General exam: Appears calm and comfortable  Respiratory system: Clear to auscultation. Respiratory effort normal. Cardiovascular system: S1-2, RRR, no murmurs, no pedal edema Gastrointestinal system: Soft, NT/ND, normal bowel sounds Central nervous system: Alert and oriented. No focal neurological deficits. Extremities: Symmetric 5 x 5 power. Skin: No rashes, lesions or ulcers Psychiatry: Judgement and insight appear normal. Mood & affect appropriate.     Data Reviewed: I have personally reviewed following labs and imaging studies  CBC: Recent Labs  Lab 12/20/23 1501 12/21/23 0225  WBC 9.8 10.1  HGB 12.2 11.2*  HCT 37.3 34.3*  MCV 89.2 89.1  PLT 228 221   Basic Metabolic Panel: Recent Labs  Lab 12/20/23  1501 12/21/23 0225  NA 139 138  K 3.8 3.6  CL 102 103  CO2 22 26  GLUCOSE 166* 149*  BUN 6* <5*  CREATININE 0.64 0.63  CALCIUM  8.9 8.3*   GFR: Estimated Creatinine Clearance: 51.9 mL/min (by C-G formula based on SCr of 0.63 mg/dL). Liver Function Tests: No results for input(s): AST, ALT, ALKPHOS, BILITOT, PROT, ALBUMIN in the last 168 hours. No results for  input(s): LIPASE, AMYLASE in the last 168 hours. No results for input(s): AMMONIA in the last 168 hours. Coagulation Profile: No results for input(s): INR, PROTIME in the last 168 hours. Cardiac Enzymes: No results for input(s): CKTOTAL, CKMB, CKMBINDEX, TROPONINI in the last 168 hours. BNP (last 3 results) No results for input(s): PROBNP in the last 8760 hours. HbA1C: No results for input(s): HGBA1C in the last 72 hours. CBG: Recent Labs  Lab 12/20/23 2132 12/21/23 0713 12/21/23 1152  GLUCAP 189* 174* 170*   Lipid Profile: No results for input(s): CHOL, HDL, LDLCALC, TRIG, CHOLHDL, LDLDIRECT in the last 72 hours. Thyroid  Function Tests: Recent Labs    12/20/23 1644  TSH 1.110   Anemia Panel: No results for input(s): VITAMINB12, FOLATE, FERRITIN, TIBC, IRON , RETICCTPCT in the last 72 hours. Sepsis Labs: No results for input(s): PROCALCITON, LATICACIDVEN in the last 168 hours.  No results found for this or any previous visit (from the past 240 hours).       Radiology Studies: DG Chest 2 View Result Date: 12/20/2023 EXAM: 2 VIEW(S) XRAY OF THE CHEST 12/20/2023 03:26:10 PM COMPARISON: 04/09/2018 CLINICAL HISTORY: weakness FINDINGS: LUNGS AND PLEURA: Left basilar atelectasis. No pleural effusion. No pneumothorax. HEART AND MEDIASTINUM: Large hiatal hernia is noted. No acute abnormality of the cardiac and mediastinal silhouettes. BONES AND SOFT TISSUES: No acute osseous abnormality. IMPRESSION: 1. Large hiatal hernia. 2. Left basilar atelectasis. Electronically signed by: Lynwood Seip MD 12/20/2023 04:24 PM EST RP Workstation: [id number]        Scheduled Meds:  aspirin  EC  81 mg Oral QHS   atorvastatin   40 mg Oral QHS   busPIRone   10 mg Oral BID   enoxaparin  (LOVENOX ) injection  40 mg Subcutaneous QHS   ferrous sulfate   325 mg Oral Q breakfast   insulin  aspart  0-15 Units Subcutaneous TID WC   insulin  aspart  0-5 Units  Subcutaneous QHS   levothyroxine   50 mcg Oral Q0600   lidocaine   1 patch Transdermal Q24H   [START ON 12/22/2023] lisinopril   40 mg Oral Daily   metoprolol  tartrate  25 mg Oral BID   pantoprazole   40 mg Oral BID   QUEtiapine   75 mg Oral TID   sucralfate   1 g Oral TID WC & HS   zolpidem   5 mg Oral QHS   Continuous Infusions:   LOS: 0 days      Calvin KATHEE Robson, MD [facility]   If 7PM-7AM, please contact night-coverage  12/21/2023, 3:21 PM

## 2023-12-21 NOTE — Evaluation (Signed)
 Occupational Therapy Evaluation Patient Details Name: [name] MRN: [id number] DOB: December 08, 1945 Today's Date: 12/21/2023   History of Present Illness   78 y.o. Caucasian female with medical history significant for anxiety, depression, type 2 diabetes mellitus, hypertension, dyslipidemia and hypothyroidism as well as GERD, who presented to the emergency room with acute onset of near syncope with associated dizziness and lightheadedness when she went out to get breakfast and was trying to get out of her car.  Her daughter brought a seat to get her out and later called the ambulance.     Clinical Impressions Patient presenting with decreased Ind in self care,balance, functional mobility/transfers, endurance, and safety awareness. Patient reports living at home with daughter and being Ind with self care tasks. Pt uses SPC vs. Quad cane for community mobility and does not use in home.Pt performing bed mobility from ED stretcher with CGA and stands with HHA. Pt ambulates with HHA to bathroom for toileting needs . While seated she changes LB clothing but needs assistance to thread clothing onto B feet as they do not reach the floor. Min guard to stand from commode. Pt then stands and ambulates to sink for hand hygiene and returns to room in same manner. Pt reports she feels close to baseline but does feel weaker than normal. BP when seated EOB at beginning of session is 162/78 and BP after session elevated to 187/99. Patient will benefit from acute OT to increase overall independence in the areas of ADLs, functional mobility, and safety awareness in order to safely discharge.     If plan is discharge home, recommend the following:   A little help with walking and/or transfers;A little help with bathing/dressing/bathroom;Assistance with cooking/housework;Assist for transportation     Functional Status Assessment   Patient has had a recent decline in their functional status and demonstrates  the ability to make significant improvements in function in a reasonable and predictable amount of time.     Equipment Recommendations   None recommended by OT      Precautions/Restrictions   Precautions Precautions: Fall     Mobility Bed Mobility Overal bed mobility: Needs Assistance Bed Mobility: Supine to Sit, Sit to Supine     Supine to sit: Contact guard Sit to supine: Contact guard assist   General bed mobility comments: from elevated ED stretcher    Transfers Overall transfer level: Needs assistance Equipment used: 1 person hand held assist Transfers: Sit to/from Stand Sit to Stand: Contact guard assist                  Balance Overall balance assessment: Needs assistance Sitting-balance support: Feet unsupported Sitting balance-Leahy Scale: Good Sitting balance - Comments: Pt's feet do not touch floor secondary to being 5' tall   Standing balance support: Single extremity supported Standing balance-Leahy Scale: Fair                             ADL either performed or assessed with clinical judgement   ADL Overall ADL's : Needs assistance/impaired     Grooming: Wash/dry hands;Standing;Supervision/safety               Lower Body Dressing: Minimal assistance;Sit to/from stand Lower Body Dressing Details (indicate cue type and reason): to thread pants and underwear on while seated on commode Toilet Transfer: Contact guard assist;Regular Toilet;Grab bars   Toileting- Clothing Manipulation and Hygiene: Contact guard assist  Vision Baseline Vision/History: 1 Wears glasses Ability to See in Adequate Light: 0 Adequate Patient Visual Report: No change from baseline              Pertinent Vitals/Pain Pain Assessment Pain Assessment: No/denies pain     Extremity/Trunk Assessment Upper Extremity Assessment Upper Extremity Assessment: Overall WFL for tasks assessed;Generalized weakness   Lower Extremity  Assessment Lower Extremity Assessment: Generalized weakness       Communication Communication Communication: No apparent difficulties   Cognition Arousal: Alert Behavior During Therapy: WFL for tasks assessed/performed Cognition: No apparent impairments                               Following commands: Intact                  Home Living Family/patient expects to be discharged to:: Private residence Living Arrangements: Children Available Help at Discharge: Family;Available PRN/intermittently Type of Home: Apartment Home Access: Level entry     Home Layout: One level     Bathroom Shower/Tub: Tub/shower unit         Home Equipment: Cane - single point   Additional Comments: Daughter works at [employer].      Prior Functioning/Environment Prior Level of Function : Independent/Modified Independent;History of Falls (last six months)             Mobility Comments: Use of SPC in community and endorses falling at home while sleep walking in the last several months ADLs Comments: Ind with ADLs and shares IADL tasks with daughter. She does not drive but daughter takes her to appointments/into community as needed.    OT Problem List: Decreased strength;Impaired balance (sitting and/or standing);Decreased safety awareness;Decreased activity tolerance   OT Treatment/Interventions: Self-care/ADL training;Therapeutic activities;Therapeutic exercise;Energy conservation;Patient/family education      OT Goals(Current goals can be found in the care plan section)   Acute Rehab OT Goals Patient Stated Goal: to go home OT Goal Formulation: With patient Time For Goal Achievement: 01/04/24 Potential to Achieve Goals: Fair ADL Goals Pt Will Perform Grooming: with modified independence;standing Pt Will Perform Lower Body Dressing: with modified independence;sit to/from stand Pt Will Transfer to Toilet: with modified independence;ambulating Pt Will Perform  Toileting - Clothing Manipulation and hygiene: with modified independence;sit to/from stand   OT Frequency:  Min 2X/week       AM-PAC OT 6 Clicks Daily Activity     Outcome Measure Help from another person eating meals?: None Help from another person taking care of personal grooming?: None Help from another person toileting, which includes using toliet, bedpan, or urinal?: A Little Help from another person bathing (including washing, rinsing, drying)?: A Little Help from another person to put on and taking off regular upper body clothing?: None Help from another person to put on and taking off regular lower body clothing?: A Little 6 Click Score: 21   End of Session Nurse Communication: Mobility status  Activity Tolerance: Patient tolerated treatment well Patient left: in bed;with call bell/phone within reach  OT Visit Diagnosis: Unsteadiness on feet (R26.81);Muscle weakness (generalized) (M62.81)                Time: 1030-1049 OT Time Calculation (min): 19 min Charges:  OT General Charges $OT Visit: 1 Visit OT Evaluation $OT Eval Low Complexity: 1 Low OT Treatments $Self Care/Home Management : 8-22 mins Izetta Claude, MS, OTR/L , CBIS ascom [phone]  12/21/23, 11:10 AM

## 2023-12-21 NOTE — Assessment & Plan Note (Signed)
-   The patient will be admitted to progressive unit observation bed. - Will continue antihypertensive therapy. - She will be placed on as needed IV labetalol and hydralazine. - This could be the main reason for her dizziness and lightheadedness and presyncope.

## 2023-12-21 NOTE — Assessment & Plan Note (Signed)
-   This is likely secondary to above. - Management as above. - Will monitor her for arrhythmias. - Will check orthostatics.

## 2023-12-21 NOTE — Plan of Care (Signed)
  Problem: Education: Goal: Ability to describe self-care measures that may prevent or decrease complications (Diabetes Survival Skills Education) will improve Outcome: Progressing   Problem: Coping: Goal: Ability to adjust to condition or change in health will improve Outcome: Progressing   Problem: Fluid Volume: Goal: Ability to maintain a balanced intake and output will improve Outcome: Progressing   Problem: Health Behavior/Discharge Planning: Goal: Ability to identify and utilize available resources and services will improve Outcome: Progressing   Problem: Metabolic: Goal: Ability to maintain appropriate glucose levels will improve Outcome: Progressing   Problem: Tissue Perfusion: Goal: Adequacy of tissue perfusion will improve Outcome: Progressing   

## 2023-12-21 NOTE — ED Notes (Signed)
 Walked by patient as she ambulated to the bathroom with a walker. When we arrived in the bathroom the patient asked me to step out and she would use the call light when she was finished.

## 2023-12-21 NOTE — ED Notes (Signed)
 Pt daughter presented to nursing station asking about patients lunch tray. Dietary called and they stated patients food is on the way. Pt and her daughter updated that her food is on the way.

## 2023-12-21 NOTE — ED Notes (Signed)
 Pt daughter bedside asking for an update. Explained to patient daughter that there is note in the chart from the doctor today. The last note was last night.

## 2023-12-21 NOTE — Assessment & Plan Note (Signed)
 Will continue statin therapy

## 2023-12-21 NOTE — Evaluation (Signed)
 Physical Therapy Evaluation Patient Details Name: [name] MRN: [id number] DOB: [birth year] Today's Date: 12/21/2023  History of Present Illness  78 y.o. Caucasian female with medical history significant for anxiety, depression, type 2 diabetes mellitus, hypertension, dyslipidemia and hypothyroidism as well as GERD, who presented to the emergency room with acute onset of near syncope with associated dizziness and lightheadedness when she went out to get breakfast and was trying to get out of her car.  Her daughter brought a seat to get her out and later called the ambulance.  Clinical Impression  Patient noted to be in supine position with HOB elevated at PT arrival in room, for an initial PT evaluation due to a decline in functional status, with baseline mobility reported as modI with history of falls, and currently requiring CGA for ambulation (50' feet) and vc for safety with LOB. The patient is A&O x 4, presenting with good willingness to work with PT and goals of getting BP down, with discharge expectations that include OPPT. The patient resides in a house and lives daughter with family/friend support. Pt has level entrance into home. Gait was assessed with RW, limited by safety awareness and elevated BP at baseline. Evaluated adjusted accordingly with reduced gait mechanic observation distance. Gait mechanic observations noted trendelenburg and decreased safety awareness. The overall clinical impression is that the patient presents with mild mobility limitations secondary to chronic sedentary lifestyle. Recommended skilled PT will address safety, mobility, and discharge planning. PT recommendation to d/c patient to OPPT upon medical clearance.        If plan is discharge home, recommend the following: A little help with walking and/or transfers;A little help with bathing/dressing/bathroom;Help with stairs or ramp for entrance   Can travel by private vehicle        Equipment  Recommendations None recommended by PT  Recommendations for Other Services       Functional Status Assessment Patient has had a recent decline in their functional status and/or demonstrates limited ability to make significant improvements in function in a reasonable and predictable amount of time     Precautions / Restrictions Precautions Precautions: Fall Restrictions Weight Bearing Restrictions Per Provider Order: No      Mobility  Bed Mobility Overal bed mobility: Needs Assistance Bed Mobility: Supine to Sit, Sit to Supine     Supine to sit: Contact guard Sit to supine: Contact guard assist   General bed mobility comments: from elevated ED stretcher    Transfers Overall transfer level: Needs assistance Equipment used: 1 person hand held assist Transfers: Sit to/from Stand Sit to Stand: Contact guard assist                Ambulation/Gait Ambulation/Gait assistance: Contact guard assist, Supervision Gait Distance (Feet): 50 Feet Assistive device: Rolling walker (2 wheels) Gait Pattern/deviations: Step-through pattern, Drifts right/left, Narrow base of support, Trendelenburg, Trunk flexed Gait velocity: decreased     General Gait Details: unsteady with RW; vc needed for safety awareness; pt. demonstrated LOB without being aware; family in room states that this is her baseline  Careers Information Officer     Tilt Bed    Modified Rankin (Stroke Patients Only)       Balance Overall balance assessment: Needs assistance Sitting-balance support: Feet unsupported Sitting balance-Leahy Scale: Good Sitting balance - Comments: Pt's feet do not touch floor secondary to being 5' tall   Standing balance support: During functional activity, Rel[place]  on assistive device for balance Standing balance-Leahy Scale: Fair               High level balance activites: Side stepping, Direction changes, Backward walking, Head turns High Level Balance  Comments: LOB with high level gait; increased unsteadiness             Pertinent Vitals/Pain Pain Assessment Pain Assessment: No/denies pain    Home Living Family/patient expects to be discharged to:: Private residence Living Arrangements: Children Available Help at Discharge: Family;Available PRN/intermittently Type of Home: Apartment Home Access: Level entry       Home Layout: One level Home Equipment: Cane - single point Additional Comments: Daughter works at [employer].    Prior Function Prior Level of Function : Independent/Modified Independent;History of Falls (last six months)             Mobility Comments: Use of SPC in community and endorses falling at home while sleep walking in the last several months ADLs Comments: Ind with ADLs and shares IADL tasks with daughter. She does not drive but daughter takes her to appointments/into community as needed.     Extremity/Trunk Assessment   Upper Extremity Assessment Upper Extremity Assessment: Generalized weakness;Defer to OT evaluation    Lower Extremity Assessment Lower Extremity Assessment: Generalized weakness    Cervical / Trunk Assessment Cervical / Trunk Assessment: Kyphotic  Communication   Communication Communication: No apparent difficulties    Cognition Arousal: Alert Behavior During Therapy: WFL for tasks assessed/performed                             Following commands: Intact       Cueing       General Comments      Exercises     Assessment/Plan    PT Assessment Patient needs continued PT services  PT Problem List Decreased balance;Decreased mobility       PT Treatment Interventions Gait training;Stair training;Functional mobility training;Therapeutic activities;Therapeutic exercise;Patient/family education;Neuromuscular re-education    PT Goals (Current goals can be found in the Care Plan section)  Acute Rehab PT Goals Patient Stated Goal: Pt wants blood  pressure down PT Goal Formulation: With patient/family Time For Goal Achievement: 01/04/24 Potential to Achieve Goals: Fair    Frequency Min 1X/week     Co-evaluation               AM-PAC PT 6 Clicks Mobility  Outcome Measure Help needed turning from your back to your side while in a flat bed without using bedrails?: None Help needed moving from lying on your back to sitting on the side of a flat bed without using bedrails?: None Help needed moving to and from a bed to a chair (including a wheelchair)?: None Help needed standing up from a chair using your arms (e.g., wheelchair or bedside chair)?: None Help needed to walk in hospital room?: None Help needed climbing 3-5 steps with a railing? : A Little 6 Click Score: 23    End of Session Equipment Utilized During Treatment: Gait belt Activity Tolerance: Patient tolerated treatment well Patient left: in bed;with family/visitor present;with nursing/sitter in room Nurse Communication: Mobility status PT Visit Diagnosis: Muscle weakness (generalized) (M62.81);History of falling (Z91.81)    Time: 8396-8377 PT Time Calculation (min) (ACUTE ONLY): 19 min   Charges:   PT Evaluation $PT Eval Low Complexity: 1 Low   PT General Charges $$ ACUTE PT VISIT: 1 Visit  Sherlean Lesches DPT, PT    Sherlean DELENA Lesches 12/21/2023, 4:39 PM

## 2023-12-21 NOTE — Assessment & Plan Note (Signed)
-   Will continue BuSpar  and Seroquel  as well as Celexa .

## 2023-12-21 NOTE — Assessment & Plan Note (Signed)
-   Will continue Synthroid and check TSH.

## 2023-12-22 DIAGNOSIS — I160 Hypertensive urgency: Secondary | ICD-10-CM | POA: Diagnosis not present

## 2023-12-22 LAB — URINE CULTURE

## 2023-12-22 LAB — GLUCOSE, CAPILLARY
Glucose-Capillary: 177 mg/dL — ABNORMAL HIGH (ref 70–99)
Glucose-Capillary: 139 mg/dL — ABNORMAL HIGH (ref 70–99)

## 2023-12-22 MED ORDER — LISINOPRIL 40 MG PO TABS: 40.0000 mg | ORAL_TABLET | Freq: Every day | ORAL | 0 refills | Status: DC

## 2023-12-22 MED ORDER — METOPROLOL TARTRATE 50 MG PO TABS: 50.0000 mg | ORAL_TABLET | Freq: Two times a day (BID) | ORAL | Status: DC

## 2023-12-22 MED ORDER — METOPROLOL TARTRATE 50 MG PO TABS: 50.0000 mg | ORAL_TABLET | Freq: Two times a day (BID) | ORAL | 0 refills | Status: DC

## 2023-12-22 MED ORDER — METOPROLOL TARTRATE 25 MG PO TABS
25.0000 mg | ORAL_TABLET | Freq: Once | ORAL | Status: AC
  Administered 2023-12-22: 25 mg via ORAL
  Filled 2023-12-22: qty 1

## 2023-12-22 NOTE — Discharge Instructions (Signed)

## 2023-12-22 NOTE — Plan of Care (Signed)
 Patient states that she is ready to get discharged. Orthostatic vitals done and in chart.   MD aware.  Problem: Education: Goal: Ability to describe self-care measures that may prevent or decrease complications (Diabetes Survival Skills Education) will improve Outcome: Progressing Goal: Individualized Educational Video(s) Outcome: Progressing   Problem: Coping: Goal: Ability to adjust to condition or change in health will improve Outcome: Progressing   Problem: Fluid Volume: Goal: Ability to maintain a balanced intake and output will improve Outcome: Progressing   Problem: Health Behavior/Discharge Planning: Goal: Ability to identify and utilize available resources and services will improve Outcome: Progressing Goal: Ability to manage health-related needs will improve Outcome: Progressing   Problem: Metabolic: Goal: Ability to maintain appropriate glucose levels will improve Outcome: Progressing   Problem: Nutritional: Goal: Maintenance of adequate nutrition will improve Outcome: Progressing Goal: Progress toward achieving an optimal weight will improve Outcome: Progressing   Problem: Skin Integrity: Goal: Risk for impaired skin integrity will decrease Outcome: Progressing   Problem: Tissue Perfusion: Goal: Adequacy of tissue perfusion will improve Outcome: Progressing   Problem: Education: Goal: Knowledge of General Education information will improve Description: Including pain rating scale, medication(s)/side effects and non-pharmacologic comfort measures Outcome: Progressing   Problem: Health Behavior/Discharge Planning: Goal: Ability to manage health-related needs will improve Outcome: Progressing   Problem: Clinical Measurements: Goal: Ability to maintain clinical measurements within normal limits will improve Outcome: Progressing Goal: Will remain free from infection Outcome: Progressing Goal: Diagnostic test results will improve Outcome:  Progressing Goal: Respiratory complications will improve Outcome: Progressing Goal: Cardiovascular complication will be avoided Outcome: Progressing   Problem: Activity: Goal: Risk for activity intolerance will decrease Outcome: Progressing   Problem: Nutrition: Goal: Adequate nutrition will be maintained Outcome: Progressing   Problem: Coping: Goal: Level of anxiety will decrease Outcome: Progressing   Problem: Elimination: Goal: Will not experience complications related to bowel motility Outcome: Progressing Goal: Will not experience complications related to urinary retention Outcome: Progressing   Problem: Pain Managment: Goal: General experience of comfort will improve and/or be controlled Outcome: Progressing   Problem: Safety: Goal: Ability to remain free from injury will improve Outcome: Progressing   Problem: Skin Integrity: Goal: Risk for impaired skin integrity will decrease Outcome: Progressing

## 2023-12-22 NOTE — Discharge Summary (Signed)
 Physician Discharge Summary  [name] FMW:[id number] DOB: 02/13/1945 DOA: 12/20/2023  PCP: Chandra Toribio POUR, MD  Admit date: 12/20/2023 Discharge date: 12/22/2023  Admitted From: Home Disposition:  Home  Recommendations for Outpatient Follow-up:  Follow up with PCP in 1 weeks Ambulatory referral to outpatient PT  Home Health:No  Equipment/Devices: None  Discharge Condition: Stable CODE STATUS: Full Diet recommendation: Heart healthy  Brief/Interim Summary:   78 y.o. Caucasian female with medical history significant for anxiety, depression, type 2 diabetes mellitus, hypertension, dyslipidemia and hypothyroidism as well as GERD, who presented to the emergency room with acute onset of near syncope with associated dizziness and lightheadedness when she went out to get breakfast and was trying to get out of her car.  Her daughter brought a seat to get her out and later called the ambulance.  The patient felt numb all over however denied any unilateral paresthesias or focal muscle weakness.  She denied any dysphagia or dysarthria.  No cough or wheezing or dyspnea.  No chest pain or palpitations.  No dysuria, oliguria or hematuria or flank pain.       Discharge Diagnoses:  Principal Problem:   Hypertensive urgency Active Problems:   Near syncope   Hypothyroidism   Acute lower UTI   Anxiety and depression   Dyslipidemia   Controlled type 2 diabetes mellitus without complication, without long-term current use of insulin  (HCC)   Hypertensive emergency * Hypertensive emergency Suspected the patient's near syncopal event was secondary to severely uncontrolled blood pressure.  BP control has improved at time of discharge.  Did not wish to drop blood pressure too low to acutely.  Lengthy discussion with patient and patient's daughter at time of bedside.  At time of discharge would recommend metoprolol  to tartrate 50 mg twice daily and lisinopril  40 mg daily.  Patient advised to keep  close eye on her blood pressure at home and if need be take extra 12.5 mg of metoprolol  tartrate.  Patient advised to seek follow-up with her primary care physician within 1 week of discharge for further discussion regarding blood pressure management and titration of medication regimen Plan: Charge home.  Prescription provided for metoprolol  tartrate 50 mg twice daily and lisinopril  40 mg daily     Near syncope Suspect secondary to uncontrolled blood pressure Did well with therapy.  Outpatient physical therapy recommended.  Referral for [facility] rehab services placed at time of discharge.     Discharge Instructions  Discharge Instructions     Ambulatory referral to Physical Therapy   Complete by: As directed    Diet - low sodium heart healthy   Complete by: As directed    Increase activity slowly   Complete by: As directed       Allergies as of 12/22/2023   No Known Allergies      Medication List     STOP taking these medications    diltiazem  120 MG 24 hr capsule Commonly known as: CARDIZEM  CD   diltiazem  30 MG tablet Commonly known as: CARDIZEM    nitrofurantoin (macrocrystal-monohydrate) 100 MG capsule Commonly known as: MACROBID   nystatin  ointment Commonly known as: MYCOSTATIN        TAKE these medications    alendronate  70 MG tablet Commonly known as: FOSAMAX  TAKE 1 TABLET (70 MG TOTAL) BY MOUTH EVERY 7 DAYS WITH FULL GLASS WATER ON EMPTY STOMACH What changed: See the new instructions.   aspirin  81 MG tablet Take 1 tablet by mouth daily.   atorvastatin  40  MG tablet Commonly known as: LIPITOR TAKE 1 TABLET BY MOUTH EVERY DAY   busPIRone  10 MG tablet Commonly known as: BUSPAR  TAKE 2 TABLETS BY MOUTH 2 TIMES DAILY. TAKE 1/2 TO 1 TABLET BY MOUTH TWICE DAILY.   citalopram  40 MG tablet Commonly known as: CELEXA  TAKE 1 TABLET BY MOUTH EVERY DAY   ferrous sulfate  325 (65 FE) MG tablet TAKE 1 TABLET BY MOUTH EVERY DAY WITH BREAKFAST   levothyroxine  50  MCG tablet Commonly known as: SYNTHROID  TAKE 1 TABLET BY MOUTH EVERY DAY IN THE MORNING   lidocaine  5 % Commonly known as: LIDODERM  PLACE 1 PATCH ONTO THE SKIN DAILY. REMOVE & DISCARD PATCH WITHIN 12 HOURS OR AS DIRECTED BY MD   lisinopril  40 MG tablet Commonly known as: ZESTRIL  Take 1 tablet (40 mg total) by mouth daily. Start taking on: December 23, 2023 What changed:  medication strength how much to take   loperamide  2 MG capsule Commonly known as: IMODIUM  Take 2 capsules (4 mg total) by mouth as needed for diarrhea or loose stools.   metFORMIN  850 MG tablet Commonly known as: GLUCOPHAGE  TAKE 1 TABLET (850 MG TOTAL) BY MOUTH 2 (TWO) TIMES DAILY WITH A MEAL.   metoprolol  tartrate 50 MG tablet Commonly known as: LOPRESSOR  Take 1 tablet (50 mg total) by mouth 2 (two) times daily. What changed:  medication strength how much to take   naproxen  375 MG tablet Commonly known as: NAPROSYN  Take 1 tablet (375 mg total) by mouth 2 (two) times daily as needed for moderate pain (pain score 4-6).   pantoprazole  40 MG tablet Commonly known as: PROTONIX  TAKE 1 TABLET BY MOUTH TWICE A DAY   promethazine  25 MG tablet Commonly known as: PHENERGAN  TAKE 1 TABLET BY MOUTH TWICE A DAY AS NEEDED FOR NAUSEA OR VOMITING   QUEtiapine  50 MG tablet Commonly known as: SEROQUEL  TAKE 1 TABLET BY MOUTH 3 TIMES DAILY. MAY TAKE 1 TABLET IN AFTERNOON IF NEEDED FOR SEVERE ANXIETY. What changed: See the new instructions.   sucralfate  1 g tablet Commonly known as: CARAFATE  TAKE 1 TABLET BY MOUTH FOUR TIMES A DAY AS NEEDED   traMADol  50 MG tablet Commonly known as: ULTRAM  TAKE 1 TABLET BY MOUTH EVERY 6 HOURS AS NEEDED   Trulicity  0.75 MG/0.5ML Soaj Generic drug: Dulaglutide  INJECT 0.75MG  DOSE UNDER THE SKIN ONE TIME PER WEEK   zolpidem  10 MG tablet Commonly known as: AMBIEN  TAKE 1 TABLET BY MOUTH EVERY DAY AT BEDTIME AS NEEDED FOR SLEEP What changed: See the new instructions.         Follow-up Information     Chandra Toribio POUR, MD. Schedule an appointment as soon as possible for a visit in 1 week(s).   Specialty: Family Medicine Why: hospital follow up Contact information: [address] [place] [address] [phone]                No Known Allergies  Consultations: None   Procedures/Studies: DG Chest 2 View Result Date: 12/20/2023 EXAM: 2 VIEW(S) XRAY OF THE CHEST 12/20/2023 03:26:10 PM COMPARISON: 04/09/2018 CLINICAL HISTORY: weakness FINDINGS: LUNGS AND PLEURA: Left basilar atelectasis. No pleural effusion. No pneumothorax. HEART AND MEDIASTINUM: Large hiatal hernia is noted. No acute abnormality of the cardiac and mediastinal silhouettes. BONES AND SOFT TISSUES: No acute osseous abnormality. IMPRESSION: 1. Large hiatal hernia. 2. Left basilar atelectasis. Electronically signed by: Lynwood Seip MD 12/20/2023 04:24 PM EST RP Workstation: [id number]      Subjective: Seen and examined  on the day of discharge.  Stable no distress.  Appropriate for discharge home.  Discharge Exam: Vitals:   12/22/23 1221 12/22/23 1355  BP: (!) 177/73 (!) 179/75  Pulse: 85 85  Resp: 18   Temp: 98.3 F (36.8 C)   SpO2: 96%    Vitals:   12/22/23 0546 12/22/23 0847 12/22/23 1221 12/22/23 1355  BP: (!) 164/95 (!) 187/81 (!) 177/73 (!) 179/75  Pulse:  (!) 108 85 85  Resp:  16 18   Temp:  98.6 F (37 C) 98.3 F (36.8 C)   TempSrc:  Oral Oral   SpO2:  98% 96%   Weight:      Height:        General: Pt is alert, awake, not in acute distress Cardiovascular: RRR, S1/S2 +, no rubs, no gallops Respiratory: CTA bilaterally, no wheezing, no rhonchi Abdominal: Soft, NT, ND, bowel sounds + Extremities: no edema, no cyanosis    The results of significant diagnostics from this hospitalization (including imaging, microbiology, ancillary and laboratory) are listed below for reference.     Microbiology: Recent Results (from the past 240 hours)  Urine Culture  (for pregnant, neutropenic or urologic patients or patients with an indwelling urinary catheter)     Status: Abnormal   Collection Time: 12/20/23  7:55 PM   Specimen: Urine, Catheterized  Result Value Ref Range Status   Specimen Description   Final    URINE, CATHETERIZED Performed at [facility], [address] [place], [place] [address]    Special Requests   Final    NONE Performed at [facility], [address] [place], [place] [address]    Culture MULTIPLE SPECIES PRESENT, SUGGEST RECOLLECTION (A)  Final   Report Status 12/22/2023 FINAL  Final     Labs: BNP (last 3 results) No results for input(s): BNP in the last 8760 hours. Basic Metabolic Panel: Recent Labs  Lab 12/20/23 1501 12/21/23 0225  NA 139 138  K 3.8 3.6  CL 102 103  CO2 22 26  GLUCOSE 166* 149*  BUN 6* <5*  CREATININE 0.64 0.63  CALCIUM  8.9 8.3*   Liver Function Tests: No results for input(s): AST, ALT, ALKPHOS, BILITOT, PROT, ALBUMIN in the last 168 hours. No results for input(s): LIPASE, AMYLASE in the last 168 hours. No results for input(s): AMMONIA in the last 168 hours. CBC: Recent Labs  Lab 12/20/23 1501 12/21/23 0225  WBC 9.8 10.1  HGB 12.2 11.2*  HCT 37.3 34.3*  MCV 89.2 89.1  PLT 228 221   Cardiac Enzymes: No results for input(s): CKTOTAL, CKMB, CKMBINDEX, TROPONINI in the last 168 hours. BNP: Invalid input(s): POCBNP CBG: Recent Labs  Lab 12/21/23 1152 12/21/23 1644 12/21/23 2005 12/22/23 0844 12/22/23 1223  GLUCAP 170* 131* 174* 177* 139*   D-Dimer No results for input(s): DDIMER in the last 72 hours. Hgb A1c No results for input(s): HGBA1C in the last 72 hours. Lipid Profile No results for input(s): CHOL, HDL, LDLCALC, TRIG, CHOLHDL, LDLDIRECT in the last 72 hours. Thyroid  function studies Recent Labs    12/20/23 1644  TSH 1.110   Anemia work up No results for input(s): VITAMINB12, FOLATE,  FERRITIN, TIBC, IRON , RETICCTPCT in the last 72 hours. Urinalysis    Component Value Date/Time   COLORURINE STRAW (A) 12/20/2023 1955   APPEARANCEUR CLEAR (A) 12/20/2023 1955   APPEARANCEUR Clear 12/26/2018 0959   LABSPEC 1.004 (L) 12/20/2023 1955   LABSPEC 1.015 12/20/2012 1841   PHURINE 5.0 12/20/2023  1955   GLUCOSEU NEGATIVE 12/20/2023 1955   GLUCOSEU Negative 12/20/2012 1841   HGBUR NEGATIVE 12/20/2023 1955   BILIRUBINUR NEGATIVE 12/20/2023 1955   BILIRUBINUR negative 11/08/2023 1434   BILIRUBINUR Negative 12/26/2018 0959   BILIRUBINUR Negative 12/20/2012 1841   KETONESUR NEGATIVE 12/20/2023 1955   PROTEINUR NEGATIVE 12/20/2023 1955   UROBILINOGEN 1.0 11/08/2023 1434   NITRITE NEGATIVE 12/20/2023 1955   LEUKOCYTESUR SMALL (A) 12/20/2023 1955   LEUKOCYTESUR Negative 12/20/2012 1841   Sepsis Labs Recent Labs  Lab 12/20/23 1501 12/21/23 0225  WBC 9.8 10.1   Microbiology Recent Results (from the past 240 hours)  Urine Culture (for pregnant, neutropenic or urologic patients or patients with an indwelling urinary catheter)     Status: Abnormal   Collection Time: 12/20/23  7:55 PM   Specimen: Urine, Catheterized  Result Value Ref Range Status   Specimen Description   Final    URINE, CATHETERIZED Performed at [facility], [address] [place], [place] [address]    Special Requests   Final    NONE Performed at [facility], [address] [place], [place] [address]    Culture MULTIPLE SPECIES PRESENT, SUGGEST RECOLLECTION (A)  Final   Report Status 12/22/2023 FINAL  Final     Time coordinating discharge: 40 minutes   SIGNED:   Calvin KATHEE Robson, MD  [facility] 12/22/2023, 2:21 PM Pager   If 7PM-7AM, please contact night-coverage

## 2023-12-22 NOTE — Progress Notes (Signed)
 OT Cancellation Note  Patient Details Name: [name] MRN: [id number] DOB: 03/25/1945   Cancelled Treatment:    Reason Eval/Treat Not Completed: Other (comment) (patient preparing to discharge home this afternoon, declines need for therapy today)  [name] Clause 12/22/2023, 2:50 PM

## 2023-12-22 NOTE — Progress Notes (Signed)
 Mobility Specialist - Progress Note   12/22/23 1122  Mobility  Activity Ambulated with assistance  Level of Assistance Standby assist, set-up cues, supervision of patient - no hands on  Assistive Device Front wheel walker  Distance Ambulated (ft) 24 ft  Activity Response Tolerated well  Mobility visit 1 Mobility  Mobility Specialist Start Time (ACUTE ONLY) 1113  Mobility Specialist Stop Time (ACUTE ONLY) 1122  Mobility Specialist Time Calculation (min) (ACUTE ONLY) 9 min   Pt amb to/from the bathroom MinG-SBA, tolerated well. Pt returned to bed, left supine within needs within reach. Family member present at bedside.  America Silvan Mobility Specialist 12/22/23 11:29 AM

## 2023-12-25 ENCOUNTER — Telehealth: Payer: Self-pay

## 2023-12-25 NOTE — Transitions of Care (Post Inpatient/ED Visit) (Signed)
 12/25/2023  Name: [name] MRN: [id number] DOB: December 16, 1945  Today's TOC FU Call Status: Today's TOC FU Call Status:: Successful TOC FU Call Completed Unsuccessful Call (1st Attempt) Date: 12/25/23 (daughter called right back.) TOC FU Call Complete Date: 12/25/23  Patient's Name and Date of Birth confirmed. Name, DOB  Transition Care Management Follow-up Telephone Call Date of Discharge: 12/22/23 Discharge Facility[facility]) Type of Discharge: Inpatient Admission Primary Inpatient Discharge Diagnosis:: hypertensive  crisis How have you been since you were released from the hospital?: Better (daughter reports feeling better.) Any questions or concerns?: No  Items Reviewed: Did you receive and understand the discharge instructions provided?: Yes Medications obtained,verified, and reconciled?: Yes (Medications Reviewed) Any new allergies since your discharge?: No Dietary orders reviewed?: Yes Type of Diet Ordered:: low sodium heart healthy Do you have support at home?: Yes People in Home [RPT]: child(ren), adult Name of Support/Comfort Primary Source: Nature Conservation Officer  Medications Reviewed Today: Medications Reviewed Today     Reviewed by Rumalda Alan PENNER, RN (Registered Nurse) on 12/25/23 at 1435  Med List Status: <None>   Medication Order Taking? Sig Documenting Provider Last Dose Status Informant  alendronate  (FOSAMAX ) 70 MG tablet [id number] Yes TAKE 1 TABLET (70 MG TOTAL) BY MOUTH EVERY 7 DAYS WITH FULL GLASS WATER ON EMPTY STOMACH Chandra Toribio POUR, MD  Active Child, Pharmacy Records  aspirin  81 MG tablet [id number] Yes Take 1 tablet by mouth daily. [provider]  Active Family Member, Child, Pharmacy Records           Med Note [facility], [facility]   Mon Sep 12, 2016  5:57 PM)    atorvastatin  (LIPITOR) 40 MG tablet [id number] Yes TAKE 1 TABLET BY MOUTH EVERY DAY Chandra Toribio POUR, MD  Active Child, Pharmacy Records  busPIRone  (BUSPAR )  10 MG tablet [id number] Yes TAKE 2 TABLETS BY MOUTH 2 TIMES DAILY. TAKE 1/2 TO 1 TABLET BY MOUTH TWICE DAILY.  Patient taking differently: Take 2 tablets by mouth 2 (two) times daily.   Chandra Toribio POUR, MD  Active Child, Pharmacy Records  citalopram  (CELEXA ) 40 MG tablet [id number] Yes TAKE 1 TABLET BY MOUTH EVERY DAY Chandra Toribio POUR, MD  Active Child, Pharmacy Records  Dulaglutide  (TRULICITY ) 0.75 MG/0.5ML EMMANUEL [id number] Yes INJECT 0.75MG  DOSE UNDER THE SKIN ONE TIME PER WEEK Chandra Toribio POUR, MD  Active Child, Pharmacy Records  ferrous sulfate  325 [phone] FE) MG tablet [id number] Yes TAKE 1 TABLET BY MOUTH EVERY DAY WITH BREAKFAST Gayle Saddie FALCON, PA-C  Active Child, Pharmacy Records  levothyroxine  (SYNTHROID ) 50 MCG tablet [id number] Yes TAKE 1 TABLET BY MOUTH EVERY DAY IN THE MORNING Chandra Toribio POUR, MD  Active Child, Pharmacy Records  lidocaine  (LIDODERM ) 5 % [id number] Yes PLACE 1 PATCH ONTO THE SKIN DAILY. REMOVE & DISCARD PATCH WITHIN 12 HOURS OR AS DIRECTED BY MD Chandra Toribio POUR, MD  Active Child, Pharmacy Records           Med Note [facility], [facility]   Wed Dec 20, 2023  8:02 PM) prn  lisinopril  (ZESTRIL ) 40 MG tablet [id number] Yes Take 1 tablet (40 mg total) by mouth daily. Jhonny Calvin NOVAK, MD  Active   loperamide  (IMODIUM ) 2 MG capsule [id number] Yes Take 2 capsules (4 mg total) by mouth as needed for diarrhea or loose stools. Chandra Toribio POUR, MD  Active Child, Pharmacy Records           Med Note [facility], [facility] A   Wed  Dec 20, 2023  8:08 PM) prn  metFORMIN  (GLUCOPHAGE ) 850 MG tablet [id number] Yes TAKE 1 TABLET (850 MG TOTAL) BY MOUTH 2 (TWO) TIMES DAILY WITH A MEAL. Chandra Toribio POUR, MD  Active Child, Pharmacy Records  metoprolol  tartrate (LOPRESSOR ) 50 MG tablet [id number] Yes Take 1 tablet (50 mg total) by mouth 2 (two) times daily. Jhonny Calvin NOVAK, MD  Active   naproxen  (NAPROSYN ) 375 MG tablet [id number] Yes Take 1 tablet (375 mg total) by mouth 2 (two) times daily as needed for moderate pain  (pain score 4-6). Chandra Toribio POUR, MD  Active Child, Pharmacy Records           Med Note [facility], [facility] A   Wed Dec 20, 2023  8:08 PM) prn  pantoprazole  (PROTONIX ) 40 MG tablet [id number] Yes TAKE 1 TABLET BY MOUTH TWICE A DAY Gayle Saddie FALCON, PA-C  Active Child, Pharmacy Records  promethazine  (PHENERGAN ) 25 MG tablet [id number] Yes TAKE 1 TABLET BY MOUTH TWICE A DAY AS NEEDED FOR NAUSEA OR VOMITING Chandra Toribio POUR, MD  Active Child, Pharmacy Records           Med Note [facility], [facility] A   Wed Dec 20, 2023  8:08 PM) prn  QUEtiapine  (SEROQUEL ) 50 MG tablet [id number] Yes TAKE 1 TABLET BY MOUTH 3 TIMES DAILY. MAY TAKE 1 TABLET IN AFTERNOON IF NEEDED FOR SEVERE ANXIETY.  Patient taking differently: 75 mg 3 (three) times daily. Takes 75 mg three times daily   Chandra Toribio POUR, MD  Active Child, Pharmacy Records  sucralfate  (CARAFATE ) 1 g tablet [id number] Yes TAKE 1 TABLET BY MOUTH FOUR TIMES A DAY AS NEEDED Chandra Toribio POUR, MD  Active Child, Pharmacy Records           Med Note [facility], [facility] A   Wed Dec 20, 2023  8:07 PM) Patient takes regularly in the morning and at night and only if needed during the day  traMADol  (ULTRAM ) 50 MG tablet [id number] Yes TAKE 1 TABLET BY MOUTH EVERY 6 HOURS AS NEEDED Chandra Toribio POUR, MD  Active Child, Pharmacy Records           Med Note [facility], [facility] A   Wed Dec 20, 2023  8:08 PM) prn  zolpidem  (AMBIEN ) 10 MG tablet [id number] Yes TAKE 1 TABLET BY MOUTH EVERY DAY AT BEDTIME AS NEEDED FOR SLEEP Chandra Toribio POUR, MD  Active Child, Pharmacy Records            Home Care and Equipment/Supplies: Were Home Health Services Ordered?: No Any new equipment or medical supplies ordered?: No  Functional Questionnaire: Do you need assistance with bathing/showering or dressing?: No Do you need assistance with meal preparation?: No Do you need assistance with eating?: No Do you have difficulty maintaining continence: No Do you need assistance with getting out of bed/getting out  of a chair/moving?: No Do you have difficulty managing or taking your medications?: Yes (daughter)  Follow up appointments reviewed: PCP Follow-up appointment confirmed?: Yes Date of PCP follow-up appointment?: 12/27/23 Follow-up Provider: [facility] Follow-up appointment confirmed?: No Reason Specialist Follow-Up Not Confirmed: Patient has Specialist Provider Number and will Call for Appointment Do you need transportation to your follow-up appointment?: No Do you understand care options if your condition(s) worsen?: Yes-patient verbalized understanding  SDOH Interventions Today    Flowsheet Row Most Recent Value  SDOH Interventions   Food Insecurity Interventions Intervention Not Indicated  Housing Interventions Intervention Not Indicated  Transportation Interventions Intervention Not Indicated  Utilities Interventions Intervention Not Indicated   Phone all with patient and daughter.  Reviewed all discharge instructions and medications. Reviewed importance of self monitoring. Reviewed importance of keeping a log of BP to take to MD office.  Daughter reports BP are higher in the morning than during the rest of the day. Reviewed importance of low salt diet and the reasons why.  Reviewed when to call 911.  Reviewed signs of heart attack and stroke symptoms.  Encouraged patient to take all her medications as prescribed. Reviewed importance of DM control and self monitoring CBG.  Daughter reports no lancet device and states she will get one.   Reviewed and offered 30 day TOC program and daughter has declined. Provided my contact information if daughter needs to call me back... Confirmed patient has hospital follow up appointment schedule for later this week.   Alan Ee, RN, BSN, CEN [employer]- Transition of Care Team.  [facility] [phone]

## 2023-12-27 ENCOUNTER — Encounter: Payer: Self-pay | Admitting: Family Medicine

## 2023-12-27 ENCOUNTER — Ambulatory Visit (INDEPENDENT_AMBULATORY_CARE_PROVIDER_SITE_OTHER): Admitting: Family Medicine

## 2023-12-27 VITALS — BP 154/79 | HR 66 | Ht 60.0 in | Wt 164.5 lb

## 2023-12-27 DIAGNOSIS — Z7984 Long term (current) use of oral hypoglycemic drugs: Secondary | ICD-10-CM | POA: Diagnosis not present

## 2023-12-27 DIAGNOSIS — R531 Weakness: Secondary | ICD-10-CM | POA: Diagnosis not present

## 2023-12-27 DIAGNOSIS — G8929 Other chronic pain: Secondary | ICD-10-CM

## 2023-12-27 DIAGNOSIS — E1165 Type 2 diabetes mellitus with hyperglycemia: Secondary | ICD-10-CM

## 2023-12-27 DIAGNOSIS — M5441 Lumbago with sciatica, right side: Secondary | ICD-10-CM

## 2023-12-27 DIAGNOSIS — R300 Dysuria: Secondary | ICD-10-CM | POA: Diagnosis not present

## 2023-12-27 DIAGNOSIS — I152 Hypertension secondary to endocrine disorders: Secondary | ICD-10-CM

## 2023-12-27 DIAGNOSIS — R55 Syncope and collapse: Secondary | ICD-10-CM

## 2023-12-27 DIAGNOSIS — E1159 Type 2 diabetes mellitus with other circulatory complications: Secondary | ICD-10-CM | POA: Diagnosis not present

## 2023-12-27 LAB — POCT URINALYSIS DIP (CLINITEK)
Bilirubin, UA: NEGATIVE
Blood, UA: NEGATIVE
Glucose, UA: NEGATIVE mg/dL
Ketones, POC UA: NEGATIVE mg/dL
Nitrite, UA: POSITIVE — AB
POC PROTEIN,UA: NEGATIVE
Spec Grav, UA: 1.01 (ref 1.010–1.025)
Urobilinogen, UA: 0.2 U/dL
pH, UA: 6 (ref 5.0–8.0)

## 2023-12-27 MED ORDER — METOPROLOL TARTRATE 50 MG PO TABS
50.0000 mg | ORAL_TABLET | Freq: Two times a day (BID) | ORAL | 1 refills | Status: AC
Start: 2023-12-27 — End: 2024-01-26

## 2023-12-27 MED ORDER — AMLODIPINE BESYLATE 5 MG PO TABS: 5.0000 mg | ORAL_TABLET | Freq: Every day | ORAL | 3 refills | Status: AC

## 2023-12-27 MED ORDER — TRAMADOL HCL 50 MG PO TABS: 50.0000 mg | ORAL_TABLET | Freq: Four times a day (QID) | ORAL | 0 refills | Status: DC | PRN

## 2023-12-27 MED ORDER — NAPROXEN 375 MG PO TABS: 375.0000 mg | ORAL_TABLET | Freq: Two times a day (BID) | ORAL | 2 refills | Status: DC | PRN

## 2023-12-27 MED ORDER — CEPHALEXIN 500 MG PO CAPS: 500.0000 mg | ORAL_CAPSULE | Freq: Two times a day (BID) | ORAL | 0 refills | Status: DC

## 2023-12-27 MED ORDER — LISINOPRIL 40 MG PO TABS: 40.0000 mg | ORAL_TABLET | Freq: Every day | ORAL | 1 refills | Status: AC

## 2023-12-27 NOTE — Progress Notes (Signed)
 Established Patient Office Visit  Subjective   Patient ID: [name], female    DOB: 13-Feb-1945  Age: 78 y.o. MRN: [id number]  Chief Complaint  Patient presents with   Hospitalization Follow-up    HPI  Subjective - Follow up after hospitalization on 12/22/2023 for hypertensive urgency. Experienced dizziness and was found to have a BP of 200/x by EMS. Hospitalized and medications were adjusted. - Reports continued high blood pressure readings at home, in the 160s-170s. - Reports significant weakness since hospital discharge. Denies any dizziness since discharge. - Reports pain with urination. - Requesting medication refills.  Medications: Metoprolol  50mg  BID, Lisinopril  40mg  daily, Trulicity  weekly, Naproxen  375mg , Tramadol .  PMH, PSH, FH, Social Hx: PMHx: Hypertension, Diabetes, Knee replacement (bilateral). Has a walker at home from prior knee replacement surgery. PSH: Bilateral knee replacement.  ROS: GU: Reports dysuria. CONSTITUTIONAL: Reports weakness. Denies dizziness.    The ASCVD Risk score (Arnett DK, et al., 2019) failed to calculate for the following reasons:   The valid total cholesterol range is 130 to 320 mg/dL  There are no preventive care reminders to display for this patient.    Objective:     BP (!) 154/79   Pulse 66   Ht 5' (1.524 m)   Wt 164 lb 8 oz (74.6 kg)   SpO2 95%   BMI 32.13 kg/m    Physical Exam Gen: alert, oriented. Accompanied by daughter Cv :rrr Pulm: lctab Msk: slowed gait.  Ambulating with cane   Results for orders placed or performed in visit on 12/27/23  Urine Culture   Specimen: Urine   Urine  Result Value Ref Range   Urine Culture, Routine Final report    Organism ID, Bacteria Comment   Specimen status report  Result Value Ref Range   specimen status report Comment   POCT URINALYSIS DIP (CLINITEK)  Result Value Ref Range   Color, UA yellow yellow   Clarity, UA clear clear   Glucose, UA negative negative  mg/dL   Bilirubin, UA negative negative   Ketones, POC UA negative negative mg/dL   Spec Grav, UA 8.989 8.989 - 1.025   Blood, UA negative negative   pH, UA 6.0 5.0 - 8.0   POC PROTEIN,UA negative negative, trace   Urobilinogen, UA 0.2 0.2 or 1.0 E.U./dL   Nitrite, UA Positive (A) Negative   Leukocytes, UA Large (3+) (A) Negative        Assessment & Plan:   Dysuria Assessment & Plan: Reports dysuria. Urine dipstick is suggestive of a UTI. A urine culture from the hospital stay showed multiple species. - Start Keflex  500mg  BID for 5 days. - Sent urine for culture and sensitivity. - Pres[facility] [place].  Orders: -     POCT URINALYSIS DIP (CLINITEK) -     Urine Culture; Future  Chronic right-sided low back pain with right-sided sciatica Assessment & Plan: Requesting refills for pain medications. - Refill Naproxen  375mg , 30-day supply. - Refill Tramadol , 30-day supply. - [facility] [place].  Orders: -     Naproxen ; Take 1 tablet (375 mg total) by mouth 2 (two) times daily as needed for moderate pain (pain score 4-6).  Dispense: 30 tablet; Refill: 2 -     Ambulatory referral to Physical Therapy  Near syncope -     Ambulatory referral to Physical Therapy  Weakness Assessment & Plan: Reports significant weakness since recent hospitalization. Hospital physician ordered outpatient physical therapy. -  Ordered outpatient physical therapy at [facility].  Orders: -     Ambulatory referral to Physical Therapy  Hypertension associated with type 2 diabetes mellitus [facility]) Assessment & Plan: History of hypertensive urgency leading to hospitalization on 12/22/2023. In hospital, metoprolol  and lisinopril  were titrated up. Discharged on metoprolol  50mg  BID and lisinopril  40mg  daily. Home BP log shows continued elevated readings in the 160s-170s. Denies dizziness since discharge. - Continue metoprolol  50mg  BID and lisinopril  40mg   daily. - Add Amlodipine  5mg  daily. - Refills for metoprolol  and [facility]opril  [facility] [place]   Type 2 diabetes mellitus with hyperglycemia, without long-term current use of insulin  (HCC) Assessment & Plan: Follow-up on blood sugar due. Labs from hospital were reviewed and were non-urgent. - Follow up in early January for blood pressure and blood sugar check. - Will obtain diabetes labs one week prior to January appointment. - [facility] [place].   Other orders -     Lisinopril ; Take 1 tablet (40 mg total) by mouth daily.  Dispense: 90 tablet; Refill: 1 -     Metoprolol  Tartrate; Take 1 tablet (50 mg total) by mouth 2 (two) times daily.  Dispense: 180 tablet; Refill: 1 -     amLODIPine  Besylate; Take 1 tablet (5 mg total) by mouth daily.  Dispense: 90 tablet; Refill: 3 -     traMADol  HCl; Take 1 tablet (50 mg total) by mouth every 6 (six) hours as needed.  Dispense: 30 tablet; Refill: 0 -     Cephalexin ; Take 1 capsule (500 mg total) by mouth 2 (two) times daily for 5 days.  Dispense: 10 capsule; Refill: 0 -     Specimen status report     Return in about 7 weeks (around 02/14/2024) for DM, htn.    Toribio MARLA Slain, MD

## 2023-12-27 NOTE — Patient Instructions (Signed)
 It was nice to see you today,  We addressed the following topics today: - I have prescribed amlodipine to take with your blood pressure - Please check your blood pressure twice a day, in the morning and evening. - I have given you a log sheet, but you can write the readings on any paper. - Please drop off the blood pressure log at the office in three weeks so I can review it and make any needed adjustments before your next appointment. - I have ordered outpatient physical therapy for you at [facility] to help with your weakness. The hospital will call you to schedule. If you don't hear from them in a few days, please call them. - You can use your walker at home until you regain your strength. - You do not need to provide another urine sample after finishing the antibiotic unless your symptoms do not improve. - We will schedule your next appointment for early January. We will get lab work done a week before that visit.  Have a great day,  Rolan Slain, MD

## 2023-12-28 DIAGNOSIS — R300 Dysuria: Secondary | ICD-10-CM | POA: Insufficient documentation

## 2023-12-28 MED ORDER — CEPHALEXIN 500 MG PO CAPS: 500.0000 mg | ORAL_CAPSULE | Freq: Two times a day (BID) | ORAL | 0 refills | Status: AC

## 2023-12-28 MED ORDER — TRAMADOL HCL 50 MG PO TABS: 50.0000 mg | ORAL_TABLET | Freq: Four times a day (QID) | ORAL | 0 refills | Status: DC | PRN

## 2023-12-28 NOTE — Assessment & Plan Note (Signed)
 History of hypertensive urgency leading to hospitalization on 12/22/2023. In hospital, metoprolol  and lisinopril  were titrated up. Discharged on metoprolol  50mg  BID and lisinopril  40mg  daily. Home BP log shows continued elevated readings in the 160s-170s. Denies dizziness since discharge. - Continue metoprolol  50mg  BID and lisinopril  40mg  daily. - Add Amlodipine 5mg  daily. - Refills for metoprolol  and [facility]opril  [facility] [place].

## 2023-12-29 DIAGNOSIS — R531 Weakness: Secondary | ICD-10-CM | POA: Insufficient documentation

## 2023-12-29 LAB — URINE CULTURE

## 2023-12-29 LAB — SPECIMEN STATUS REPORT

## 2023-12-29 NOTE — Assessment & Plan Note (Signed)
 Reports significant weakness since recent hospitalization. Hospital physician ordered outpatient physical therapy. - Ordered outpatient physical therapy at [facility].

## 2023-12-29 NOTE — Assessment & Plan Note (Signed)
 Requesting refills for pain medications. - Refill Naproxen  375mg , 30-day supply. - Refill Tramadol , 30-day supply. - [facility] [place].

## 2023-12-29 NOTE — Assessment & Plan Note (Signed)
 Follow-up on blood sugar due. Labs from hospital were reviewed and were non-urgent. - Follow up in early January for blood pressure and blood sugar check. - Will obtain diabetes labs one week prior to January appointment. - [facility] [place].

## 2023-12-29 NOTE — Assessment & Plan Note (Signed)
 Reports dysuria. Urine dipstick is suggestive of a UTI. A urine culture from the hospital stay showed multiple species. - Start Keflex  500mg  BID for 5 days. - Sent urine for culture and sensitivity. - [facility] [place].

## 2024-01-02 ENCOUNTER — Telehealth: Payer: Self-pay

## 2024-01-02 ENCOUNTER — Other Ambulatory Visit: Payer: Self-pay | Admitting: Family Medicine

## 2024-01-02 MED ORDER — KETOCONAZOLE 2 % EX SHAM: 1.0000 | MEDICATED_SHAMPOO | CUTANEOUS | 1 refills | Status: AC

## 2024-01-02 NOTE — Telephone Encounter (Signed)
 Copied from CRM [id number]. Topic: Clinical - Medication Question >> Jan 02, 2024  9:16 AM Antony RAMAN wrote: Reason for CRM: pts daughter calling requesting ketoconazole  shampoo, forgot to ask at appt, requesting nurse call her. Cb- [phone]

## 2024-01-02 NOTE — Telephone Encounter (Signed)
 I've sent it in.

## 2024-01-09 ENCOUNTER — Ambulatory Visit: Admitting: Family Medicine

## 2024-01-22 ENCOUNTER — Other Ambulatory Visit: Payer: Self-pay | Admitting: Family Medicine

## 2024-01-22 DIAGNOSIS — F322 Major depressive disorder, single episode, severe without psychotic features: Secondary | ICD-10-CM

## 2024-01-30 ENCOUNTER — Other Ambulatory Visit: Payer: Self-pay | Admitting: Family Medicine

## 2024-01-30 DIAGNOSIS — F322 Major depressive disorder, single episode, severe without psychotic features: Secondary | ICD-10-CM

## 2024-02-07 ENCOUNTER — Other Ambulatory Visit

## 2024-02-07 DIAGNOSIS — E1165 Type 2 diabetes mellitus with hyperglycemia: Secondary | ICD-10-CM

## 2024-02-09 LAB — COMPREHENSIVE METABOLIC PANEL WITH GFR

## 2024-02-09 LAB — MICROALBUMIN / CREATININE URINE RATIO

## 2024-02-09 LAB — HEMOGLOBIN A1C
Est. average glucose Bld gHb Est-mCnc: 146 mg/dL
Hgb A1c MFr Bld: 6.7 % — ABNORMAL HIGH (ref 4.8–5.6)

## 2024-02-09 LAB — LIPID PANEL

## 2024-02-09 LAB — SPECIMEN STATUS REPORT

## 2024-02-11 ENCOUNTER — Ambulatory Visit: Payer: Self-pay | Admitting: Family Medicine

## 2024-02-12 DIAGNOSIS — D72820 Lymphocytosis (symptomatic): Secondary | ICD-10-CM

## 2024-02-13 ENCOUNTER — Inpatient Hospital Stay: Attending: Hematology and Oncology | Admitting: Hematology and Oncology

## 2024-02-13 ENCOUNTER — Inpatient Hospital Stay

## 2024-02-14 ENCOUNTER — Ambulatory Visit: Admitting: Family Medicine

## 2024-02-28 ENCOUNTER — Other Ambulatory Visit: Payer: Self-pay | Admitting: Family Medicine

## 2024-02-28 DIAGNOSIS — E039 Hypothyroidism, unspecified: Secondary | ICD-10-CM

## 2024-03-13 ENCOUNTER — Encounter: Payer: Self-pay | Admitting: Family Medicine

## 2024-03-13 ENCOUNTER — Ambulatory Visit: Admitting: Family Medicine

## 2024-03-13 VITALS — BP 117/68 | HR 63 | Ht 60.0 in | Wt 164.4 lb

## 2024-03-13 DIAGNOSIS — E1159 Type 2 diabetes mellitus with other circulatory complications: Secondary | ICD-10-CM

## 2024-03-13 DIAGNOSIS — K219 Gastro-esophageal reflux disease without esophagitis: Secondary | ICD-10-CM

## 2024-03-13 DIAGNOSIS — G8929 Other chronic pain: Secondary | ICD-10-CM

## 2024-03-13 DIAGNOSIS — E119 Type 2 diabetes mellitus without complications: Secondary | ICD-10-CM

## 2024-03-13 DIAGNOSIS — R110 Nausea: Secondary | ICD-10-CM

## 2024-03-13 DIAGNOSIS — R300 Dysuria: Secondary | ICD-10-CM

## 2024-03-13 LAB — POCT URINALYSIS DIP (CLINITEK)
Bilirubin, UA: NEGATIVE
Blood, UA: NEGATIVE
Glucose, UA: NEGATIVE mg/dL
Ketones, POC UA: NEGATIVE mg/dL
Leukocytes, UA: NEGATIVE
Nitrite, UA: NEGATIVE
POC PROTEIN,UA: NEGATIVE
Spec Grav, UA: 1.01
Urobilinogen, UA: 0.2 U/dL
pH, UA: 7

## 2024-03-13 MED ORDER — NAPROXEN 375 MG PO TABS: 375.0000 mg | ORAL_TABLET | Freq: Two times a day (BID) | ORAL | 2 refills | Status: AC | PRN

## 2024-03-13 MED ORDER — SULFAMETHOXAZOLE-TRIMETHOPRIM 800-160 MG PO TABS: 1.0000 | ORAL_TABLET | Freq: Two times a day (BID) | ORAL | 0 refills | Status: AC

## 2024-03-13 MED ORDER — PROMETHAZINE HCL 25 MG PO TABS: ORAL_TABLET | ORAL | 0 refills | Status: AC

## 2024-03-13 MED ORDER — TRAMADOL HCL 50 MG PO TABS: 50.0000 mg | ORAL_TABLET | Freq: Four times a day (QID) | ORAL | 0 refills | Status: AC | PRN

## 2024-03-13 MED ORDER — PANTOPRAZOLE SODIUM 40 MG PO TBEC: 40.0000 mg | DELAYED_RELEASE_TABLET | Freq: Two times a day (BID) | ORAL | 0 refills | Status: AC

## 2024-03-13 NOTE — Progress Notes (Unsigned)
 "   Subjective   Patient ID: [name], female    DOB: 25-Apr-1945  Age: 79 y.o. MRN: [id number]  Chief Complaint  Patient presents with   Medical Management of Chronic Issues    Discussed the use of AI scribe software for clinical note transcription with the patient, who gave verbal consent to proceed.  History of Present Illness   [name] is a 79 year old female with hypertension and diabetes who presents with urinary symptoms suggestive of a urinary tract infection.  She reports a constant stinging sensation in the genital area that is not limited to urination. She denies increased urinary frequency or discharge. She has tolerated Bactrim  and Keflex  for prior urinary tract infections without issues.  Her blood pressure is usually controlled on amlodipine , lisinopril , and metoprolol . She felt her blood pressure might be low today but feels well. Her diabetes is treated with Trulicity  0.75 mg weekly without side effects. Her A1c was checked about 1 month ago.  She has chronic back pain that is overall stable but worse today. She uses naproxen , Protonix , Phenergan , and tramadol  as needed.  She plans to visit her sister in [place]  for a month in March and is worried about accessing medical care and about falling away from home. She reports anxiety related to prior hospital care and feels more anxious about falls when not at home.          The ASCVD Risk score (Arnett DK, et al., 2019) failed to calculate for the following reasons:   The valid total cholesterol range is 130 to 320 mg/dL  There are no preventive care reminders to display for this patient.    Objective:     BP 117/68   Pulse 63   Ht 5' (1.524 m)   Wt 164 lb 6.4 oz (74.6 kg)   SpO2 97%   BMI 32.11 kg/m  {Vitals History (Optional):23777}  Physical Exam   CHEST: Lungs clear to auscultation bilaterally. No wheezes, rhonchi, or crackles.        No results found for any visits on  03/13/24.      Assessment & Plan:   Dysuria -     POCT URINALYSIS DIP (CLINITEK)  Chronic right-sided low back pain with right-sided sciatica -     Naproxen ; Take 1 tablet (375 mg total) by mouth 2 (two) times daily as needed for moderate pain (pain score 4-6).  Dispense: 30 tablet; Refill: 2 -     traMADol  HCl; Take 1 tablet (50 mg total) by mouth every 6 (six) hours as needed.  Dispense: 30 tablet; Refill: 0  Gastroesophageal reflux disease without esophagitis -     Pantoprazole  Sodium; Take 1 tablet (40 mg total) by mouth 2 (two) times daily.  Dispense: 180 tablet; Refill: 0  Nausea -     Promethazine  HCl; TAKE 1 TABLET BY MOUTH TWICE A DAY AS NEEDED FOR NAUSEA OR VOMITING  Dispense: 30 tablet; Refill: 0  Other orders -     Sulfamethoxazole -Trimethoprim ; Take 1 tablet by mouth 2 (two) times daily for 3 days.  Dispense: 6 tablet; Refill: 0    Assessment and Plan    Suspected urinary tract infection Mild dysuria without increased frequency or discharge. Differential includes urethral atrophy. No immediate need for culture unless symptoms persist. - Prescribed Bactrim  for three days for potential UTI during travel. - Advised to contact if symptoms persist or worsen.  Chronic right-sided low back pain with right-sided sciatica Chronic  low back pain with right-sided sciatica, slightly improved but persistent. Concerns about fall risk when outside the home. - Resent physical therapy referral to a different facility. - Advised to use tramadol  as needed for pain management.  Type 2 diabetes mellitus Well-controlled with current medication regimen. Last A1c within target range. - Continue current diabetes management regimen. - Schedule A1c test in four months.  Essential hypertension Blood pressure well-managed with current medications. - Continue current antihypertensive medications.  General Health Maintenance Routine health maintenance discussed. - Refilled naproxen ,  Protonix , Phenergan , and tramadol  as needed. - Advised to schedule follow-up appointment after April for routine check-up.           Return in about 3 months (around 06/10/2024) for DM sometime after may.    Toribio MARLA Slain, MD  "

## 2024-03-13 NOTE — Patient Instructions (Signed)
" ° ° °  CHRONIC RIGHT-SIDED LOW BACK PAIN WITH RIGHT-SIDED SCIATICA: Your chronic low back pain with sciatica is slightly improved but persistent. -We resent your physical therapy referral to a different facility. -Use tramadol  as needed for pain management.  TYPE 2 DIABETES MELLITUS: Your diabetes is well-controlled with your current medication regimen. -Continue your current diabetes management regimen. -Schedule an A1c test in four months.  ESSENTIAL HYPERTENSION: Your blood pressure is well-managed with your current medications. -Continue your current antihypertensive medications.  GENERAL HEALTH MAINTENANCE: We discussed routine health maintenance. -We refilled your prescriptions for naproxen , Protonix , Phenergan , and tramadol  as needed. -Schedule a follow-up appointment after April for a routine check-up.    "

## 2024-03-14 LAB — MICROALBUMIN / CREATININE URINE RATIO
Creatinine, Urine: 27.2 mg/dL
Microalb/Creat Ratio: <11 mg/g{creat} (ref 0–29)
Microalbumin, Urine: <3 ug/mL

## 2024-03-15 NOTE — Assessment & Plan Note (Signed)
 Blood pressure well-managed with current medications. - Continue amlodipine  and lisinopril 

## 2024-03-15 NOTE — Assessment & Plan Note (Signed)
 Mild dysuria without increased frequency or discharge. Differential includes urethral atrophy. No immediate need for culture unless symptoms persist. - Prescribed Bactrim  for three days for potential UTI during travel. - Advised to contact if symptoms persist or worsen.

## 2024-03-15 NOTE — Assessment & Plan Note (Signed)
 Well-controlled with current medication regimen. Last A1c within target range. - Continue current diabetes management regimen. - Schedule A1c test in four months.

## 2024-03-15 NOTE — Assessment & Plan Note (Signed)
 Chronic low back pain with right-sided sciatica, slightly improved but persistent. Concerns about fall risk when outside the home. - Resent physical therapy referral to a different facility. - Advised to use tramadol  as needed for pain management.

## 2024-06-06 ENCOUNTER — Other Ambulatory Visit

## 2024-06-13 ENCOUNTER — Ambulatory Visit: Admitting: Family Medicine
